# Patient Record
Sex: Female | Born: 1960 | Race: Black or African American | Hispanic: No | Marital: Married | Smoking: Current every day smoker
Health system: Southern US, Community
[De-identification: ages and names within clinical notes are randomized; demographics above are authoritative.]

## PROBLEM LIST (undated history)

## (undated) DIAGNOSIS — I5042 Chronic combined systolic (congestive) and diastolic (congestive) heart failure: Secondary | ICD-10-CM

## (undated) DIAGNOSIS — I469 Cardiac arrest, cause unspecified: Secondary | ICD-10-CM

## (undated) DIAGNOSIS — M5417 Radiculopathy, lumbosacral region: Secondary | ICD-10-CM

## (undated) DIAGNOSIS — I739 Peripheral vascular disease, unspecified: Secondary | ICD-10-CM

## (undated) DIAGNOSIS — I422 Other hypertrophic cardiomyopathy: Secondary | ICD-10-CM

## (undated) DIAGNOSIS — N183 Chronic kidney disease, stage 3 unspecified: Secondary | ICD-10-CM

## (undated) DIAGNOSIS — E7800 Pure hypercholesterolemia, unspecified: Secondary | ICD-10-CM

## (undated) DIAGNOSIS — F1120 Opioid dependence, uncomplicated: Secondary | ICD-10-CM

## (undated) DIAGNOSIS — I10 Essential (primary) hypertension: Secondary | ICD-10-CM

## (undated) DIAGNOSIS — I71019 Dissection of thoracic aorta, unspecified: Secondary | ICD-10-CM

## (undated) DIAGNOSIS — K769 Liver disease, unspecified: Secondary | ICD-10-CM

## (undated) DIAGNOSIS — G8929 Other chronic pain: Secondary | ICD-10-CM

## (undated) DIAGNOSIS — E669 Obesity, unspecified: Secondary | ICD-10-CM

## (undated) DIAGNOSIS — I428 Other cardiomyopathies: Secondary | ICD-10-CM

## (undated) DIAGNOSIS — R079 Chest pain, unspecified: Secondary | ICD-10-CM

## (undated) DIAGNOSIS — R55 Syncope and collapse: Secondary | ICD-10-CM

## (undated) DIAGNOSIS — H269 Unspecified cataract: Secondary | ICD-10-CM

## (undated) DIAGNOSIS — I7101 Dissection of thoracic aorta: Secondary | ICD-10-CM

## (undated) DIAGNOSIS — I119 Hypertensive heart disease without heart failure: Secondary | ICD-10-CM

## (undated) HISTORY — DX: Syncope and collapse: R55

## (undated) HISTORY — DX: Chronic kidney disease, stage 3 unspecified: N18.30

## (undated) HISTORY — DX: Unspecified cataract: H26.9

## (undated) HISTORY — DX: Chronic combined systolic (congestive) and diastolic (congestive) heart failure: I50.42

## (undated) HISTORY — DX: Other cardiomyopathies: I42.8

## (undated) HISTORY — DX: Chronic kidney disease, stage 3 (moderate): N18.3

## (undated) HISTORY — PX: ANTERIOR CRUCIATE LIGAMENT REPAIR: SHX115

## (undated) HISTORY — DX: Essential (primary) hypertension: I10

## (undated) HISTORY — DX: Dissection of thoracic aorta: I71.01

## (undated) HISTORY — DX: Dissection of thoracic aorta, unspecified: I71.019

## (undated) HISTORY — DX: Radiculopathy, lumbosacral region: M54.17

## (undated) HISTORY — DX: Hypertensive heart disease without heart failure: I42.2

## (undated) HISTORY — DX: Peripheral vascular disease, unspecified: I73.9

## (undated) HISTORY — DX: Obesity, unspecified: E66.9

## (undated) HISTORY — DX: Cardiac arrest, cause unspecified: I46.9

## (undated) HISTORY — DX: Other chronic pain: G89.29

## (undated) HISTORY — DX: Chest pain, unspecified: R07.9

## (undated) HISTORY — DX: Hypertensive heart disease without heart failure: I11.9

## (undated) HISTORY — DX: Liver disease, unspecified: K76.9

## (undated) HISTORY — DX: Pure hypercholesterolemia, unspecified: E78.00

---

## 2004-07-06 ENCOUNTER — Emergency Department: Payer: Self-pay | Admitting: Emergency Medicine

## 2005-01-24 ENCOUNTER — Emergency Department: Payer: Self-pay | Admitting: Emergency Medicine

## 2005-03-21 ENCOUNTER — Ambulatory Visit: Payer: Self-pay | Admitting: Family Medicine

## 2005-03-31 ENCOUNTER — Other Ambulatory Visit: Payer: Self-pay

## 2005-03-31 ENCOUNTER — Emergency Department: Payer: Self-pay | Admitting: Emergency Medicine

## 2005-06-04 ENCOUNTER — Ambulatory Visit: Payer: Self-pay

## 2005-06-28 ENCOUNTER — Emergency Department (HOSPITAL_COMMUNITY): Admission: EM | Admit: 2005-06-28 | Discharge: 2005-06-28 | Payer: Self-pay | Admitting: Emergency Medicine

## 2005-08-27 ENCOUNTER — Emergency Department: Payer: Self-pay | Admitting: Emergency Medicine

## 2005-09-01 ENCOUNTER — Ambulatory Visit: Payer: Self-pay | Admitting: Family Medicine

## 2005-09-23 ENCOUNTER — Encounter: Payer: Self-pay | Admitting: Family Medicine

## 2005-10-06 ENCOUNTER — Emergency Department: Payer: Self-pay | Admitting: Internal Medicine

## 2005-10-22 ENCOUNTER — Encounter: Payer: Self-pay | Admitting: Family Medicine

## 2005-11-22 ENCOUNTER — Encounter: Payer: Self-pay | Admitting: Family Medicine

## 2006-04-23 ENCOUNTER — Encounter: Admission: RE | Admit: 2006-04-23 | Discharge: 2006-04-23 | Payer: Self-pay | Admitting: Surgery

## 2006-09-14 ENCOUNTER — Ambulatory Visit: Payer: Self-pay | Admitting: Otolaryngology

## 2006-10-01 ENCOUNTER — Emergency Department: Payer: Self-pay | Admitting: Emergency Medicine

## 2007-03-12 ENCOUNTER — Emergency Department: Payer: Self-pay | Admitting: Emergency Medicine

## 2007-06-25 ENCOUNTER — Ambulatory Visit: Payer: Self-pay | Admitting: Family Medicine

## 2007-12-02 ENCOUNTER — Other Ambulatory Visit: Payer: Self-pay

## 2007-12-03 ENCOUNTER — Inpatient Hospital Stay: Payer: Self-pay | Admitting: Internal Medicine

## 2007-12-15 ENCOUNTER — Emergency Department: Payer: Self-pay | Admitting: Emergency Medicine

## 2007-12-21 ENCOUNTER — Emergency Department (HOSPITAL_COMMUNITY): Admission: EM | Admit: 2007-12-21 | Discharge: 2007-12-21 | Payer: Self-pay | Admitting: *Deleted

## 2008-01-06 ENCOUNTER — Emergency Department (HOSPITAL_COMMUNITY): Admission: EM | Admit: 2008-01-06 | Discharge: 2008-01-06 | Payer: Self-pay | Admitting: Emergency Medicine

## 2008-06-29 ENCOUNTER — Emergency Department: Payer: Self-pay | Admitting: Emergency Medicine

## 2009-01-16 ENCOUNTER — Ambulatory Visit: Payer: Self-pay | Admitting: Family Medicine

## 2009-05-03 ENCOUNTER — Emergency Department (HOSPITAL_COMMUNITY): Admission: EM | Admit: 2009-05-03 | Discharge: 2009-05-03 | Payer: Self-pay | Admitting: Emergency Medicine

## 2009-08-27 ENCOUNTER — Emergency Department: Payer: Self-pay | Admitting: Emergency Medicine

## 2010-01-24 ENCOUNTER — Emergency Department: Payer: Self-pay | Admitting: Emergency Medicine

## 2010-03-24 HISTORY — PX: CHOLECYSTECTOMY: SHX55

## 2010-04-06 ENCOUNTER — Emergency Department: Payer: Self-pay | Admitting: Emergency Medicine

## 2010-06-13 LAB — D-DIMER, QUANTITATIVE: D-Dimer, Quant: 0.69 ug/mL-FEU — ABNORMAL HIGH (ref 0.00–0.48)

## 2010-07-09 ENCOUNTER — Ambulatory Visit: Payer: Self-pay | Admitting: Internal Medicine

## 2010-07-23 ENCOUNTER — Ambulatory Visit: Payer: Self-pay | Admitting: Internal Medicine

## 2010-10-03 ENCOUNTER — Emergency Department (HOSPITAL_COMMUNITY)
Admission: EM | Admit: 2010-10-03 | Discharge: 2010-10-03 | Discharge status: Against Medical Advice | Payer: Self-pay | Attending: Emergency Medicine | Admitting: Emergency Medicine

## 2011-02-14 ENCOUNTER — Emergency Department: Payer: Self-pay | Admitting: Emergency Medicine

## 2011-04-21 ENCOUNTER — Ambulatory Visit: Payer: Self-pay | Admitting: Internal Medicine

## 2011-06-27 ENCOUNTER — Ambulatory Visit: Payer: Self-pay

## 2011-08-05 ENCOUNTER — Emergency Department: Payer: Self-pay | Admitting: Emergency Medicine

## 2011-08-05 LAB — CBC
HCT: 37.2 % (ref 35.0–47.0)
HGB: 12.8 g/dL (ref 12.0–16.0)
MCH: 31.3 pg (ref 26.0–34.0)
MCHC: 34.4 g/dL (ref 32.0–36.0)
RBC: 4.09 10*6/uL (ref 3.80–5.20)
RDW: 15.1 % — ABNORMAL HIGH (ref 11.5–14.5)
WBC: 6.2 10*3/uL (ref 3.6–11.0)

## 2011-08-05 LAB — BASIC METABOLIC PANEL
Anion Gap: 8 (ref 7–16)
BUN: 8 mg/dL (ref 7–18)
Creatinine: 0.79 mg/dL (ref 0.60–1.30)
EGFR (African American): >60
EGFR (Non-African Amer.): >60
Osmolality: 278 (ref 275–301)
Potassium: 3.1 mmol/L — ABNORMAL LOW (ref 3.5–5.1)
Sodium: 140 mmol/L (ref 136–145)

## 2011-11-05 ENCOUNTER — Emergency Department: Payer: Self-pay | Admitting: Unknown Physician Specialty

## 2011-11-05 LAB — COMPREHENSIVE METABOLIC PANEL
Albumin: 3.5 g/dL (ref 3.4–5.0)
Anion Gap: 6 — ABNORMAL LOW (ref 7–16)
BUN: 19 mg/dL — ABNORMAL HIGH (ref 7–18)
Bilirubin,Total: 0.4 mg/dL (ref 0.2–1.0)
Creatinine: 1.08 mg/dL (ref 0.60–1.30)
Glucose: 296 mg/dL — ABNORMAL HIGH (ref 65–99)
Osmolality: 291 (ref 275–301)
Potassium: 3.9 mmol/L (ref 3.5–5.1)
Sodium: 139 mmol/L (ref 136–145)
Total Protein: 7.4 g/dL (ref 6.4–8.2)

## 2011-11-05 LAB — URINALYSIS, COMPLETE
Nitrite: NEGATIVE
Ph: 5 (ref 4.5–8.0)
Protein: NEGATIVE
RBC,UR: 3 /HPF (ref 0–5)
WBC UR: 12 /HPF (ref 0–5)

## 2011-11-05 LAB — CBC
HCT: 37.8 % (ref 35.0–47.0)
HGB: 13 g/dL (ref 12.0–16.0)
MCH: 31.9 pg (ref 26.0–34.0)
MCHC: 34.3 g/dL (ref 32.0–36.0)
Platelet: 370 10*3/uL (ref 150–440)

## 2011-11-05 LAB — CK TOTAL AND CKMB (NOT AT ARMC)
CK, Total: 82 U/L (ref 21–215)
CK-MB: 1.8 ng/mL (ref 0.5–3.6)

## 2011-11-05 LAB — TROPONIN I: Troponin-I: <0.02 ng/mL

## 2011-11-06 ENCOUNTER — Emergency Department: Payer: Self-pay | Admitting: Emergency Medicine

## 2011-11-06 LAB — MAGNESIUM: Magnesium: 2.3 mg/dL

## 2011-11-06 LAB — BASIC METABOLIC PANEL
BUN: 14 mg/dL (ref 7–18)
EGFR (African American): >60
EGFR (Non-African Amer.): >60
Glucose: 281 mg/dL — ABNORMAL HIGH (ref 65–99)
Osmolality: 284 (ref 275–301)
Potassium: 4.5 mmol/L (ref 3.5–5.1)
Sodium: 137 mmol/L (ref 136–145)

## 2011-11-06 LAB — PROTIME-INR
INR: 0.9
Prothrombin Time: 12.1 secs (ref 11.5–14.7)

## 2011-11-25 ENCOUNTER — Encounter: Payer: Self-pay | Admitting: Pulmonary Disease

## 2011-11-26 ENCOUNTER — Encounter: Payer: Self-pay | Admitting: Pulmonary Disease

## 2011-11-26 ENCOUNTER — Ambulatory Visit (INDEPENDENT_AMBULATORY_CARE_PROVIDER_SITE_OTHER): Payer: Self-pay | Admitting: Pulmonary Disease

## 2011-11-26 VITALS — BP 120/72 | HR 95 | Temp 98.7°F | Ht 64.0 in | Wt 218.2 lb

## 2011-11-26 DIAGNOSIS — R0609 Other forms of dyspnea: Secondary | ICD-10-CM

## 2011-11-26 NOTE — Assessment & Plan Note (Signed)
The patient has significant dyspnea on exertion that I suspect is multifactorial.  She is obese, and has gained 40 pounds in the last one year.  She admits that she is sedentary and has poor conditioning as well.  By her description, she is having recurrent flareups of asthmatic bronchitis, related to her ongoing smoking.  It is unknown if she has COPD.  Finally, she has very significant lower extremity edema, and with her history of hypertension, I wonder if she has diastolic dysfunction.  She will obviously need for aggressive diuresis, and I will leave that to her primary care physician.  I would like to schedule her for full PFTs, and we'll keep her on her current inhaler regimen for now.  I had a long discussion with her about the role of smoking with respect to chronic airway inflammation, and how a lot of her symptoms could resolve if she was to totally quit smoking.  I will see her the same day as her pulmonary function studies to make further recommendations.

## 2011-11-26 NOTE — Progress Notes (Signed)
  Subjective:    Patient ID: Lisa Leblanc, female    DOB: 12-30-1960, 51 y.o.   MRN: [id number]  HPI The patient is a very pleasant 51 year old female who I've been asked to see for evaluation of dyspnea on exertion.  The patient states that she has had shortness of breath for at least a year, and feels that it is worsening.  She describes a 3-4 block dyspnea on exertion on flat ground at a moderate pace, and will get winded bringing groceries in from the car.  She sometimes will get short of breath with light housework.  She states there is variability in her breathing symptoms, and this typically can change month to month.  She also describes flareups on a frequent basis that requires a course of antibiotics and prednisone to improve.  At baseline, she has mild cough, with very little mucus.  She also has had a big problem with lower extremity edema, and tells me that she has had an ultrasound exam that was negative for DVT.  She has also had an echocardiogram, and tells me that her heart function was normal.  She had a chest x-ray approximately 2-3 months ago, and tells me that it was clear.  It is not available for my review.  The patient does smoke one pack of cigarettes a day and has done so for many years.  She has never had pulmonary function studies.  She also states that her weight is up 40 pounds over the last one year.   Review of Systems  Constitutional: Negative for fever and unexpected weight change.  HENT: Negative for ear pain, nosebleeds, congestion, sore throat, rhinorrhea, sneezing, trouble swallowing, dental problem, postnasal drip and sinus pressure.   Eyes: Negative for redness and itching.  Respiratory: Positive for chest tightness, shortness of breath and wheezing. Negative for cough.   Cardiovascular: Positive for leg swelling. Negative for palpitations.  Gastrointestinal: Negative for nausea and vomiting.  Genitourinary: Negative for dysuria.  Musculoskeletal: Positive  for joint swelling.  Skin: Negative for rash.  Neurological: Negative for headaches.  Hematological: Does not bruise/bleed easily.  Psychiatric/Behavioral: Negative for dysphoric mood. The patient is not nervous/anxious.        Objective:   Physical Exam Constitutional:  Obese female, no acute distress  HENT:  Nares patent without discharge  Oropharynx without exudate, palate and uvula are thick and elongated.   Eyes:  Perrla, eomi, no scleral icterus  Neck:  No JVD, no TMG  Cardiovascular:  Normal rate, regular rhythm, no rubs or gallops.  No murmurs        Intact distal pulses  Pulmonary :  Normal breath sounds, no stridor or respiratory distress   No rales, rhonchi, or wheezing  Abdominal:  Soft, nondistended, bowel sounds present.  No tenderness noted.   Musculoskeletal:  2+ lower extremity edema noted.  Lymph Nodes:  No cervical lymphadenopathy noted  Skin:  No cyanosis noted  Neurologic:  Alert, appropriate, moves all 4 extremities without obvious deficit.         Assessment & Plan:

## 2011-11-26 NOTE — Patient Instructions (Addendum)
Will schedule for breathing tests in next 1-2 weeks, and I would like to see you the same day to review Stop smoking.  This is the only way to put the fire out.   No change in breathing medications for now, and will re-address after your breathing tests.

## 2011-12-04 ENCOUNTER — Telehealth: Payer: Self-pay | Admitting: Pulmonary Disease

## 2011-12-04 ENCOUNTER — Ambulatory Visit (INDEPENDENT_AMBULATORY_CARE_PROVIDER_SITE_OTHER): Payer: Self-pay | Admitting: Pulmonary Disease

## 2011-12-04 ENCOUNTER — Encounter: Payer: Self-pay | Admitting: Pulmonary Disease

## 2011-12-04 VITALS — BP 132/82 | HR 62 | Temp 98.4°F | Ht 64.0 in | Wt 233.0 lb

## 2011-12-04 DIAGNOSIS — Z23 Encounter for immunization: Secondary | ICD-10-CM

## 2011-12-04 DIAGNOSIS — J45901 Unspecified asthma with (acute) exacerbation: Secondary | ICD-10-CM | POA: Insufficient documentation

## 2011-12-04 DIAGNOSIS — R0989 Other specified symptoms and signs involving the circulatory and respiratory systems: Secondary | ICD-10-CM

## 2011-12-04 LAB — PULMONARY FUNCTION TEST

## 2011-12-04 MED ORDER — PREDNISONE 10 MG PO TABS: ORAL_TABLET | ORAL | Status: DC

## 2011-12-04 NOTE — Progress Notes (Signed)
  Subjective:    Patient ID: Lisa Leblanc, female    DOB: 05-Nov-1960, 51 y.o.   MRN: [id number]  HPI The patient comes in today for followup of her recent pulmonary function studies, as part of a workup for dyspnea on exertion.  She was found to have no air flow obstruction, no restriction, and a moderate decrease in diffusion capacity that nearly corrects with alveolar volume adjustment.  I have reviewed the study with her in detail, and answered all of her questions.  Today, the patient is complaining of some increased chest tightness, congestion, and a little more shortness of breath.  She is not having any cough or mucus production.  Unfortunately, she continues to smoke.   Review of Systems  Constitutional: Negative for fever and unexpected weight change.  HENT: Positive for congestion. Negative for ear pain, nosebleeds, sore throat, rhinorrhea, sneezing, trouble swallowing, dental problem, postnasal drip and sinus pressure.   Eyes: Negative for redness and itching.  Respiratory: Positive for chest tightness, shortness of breath and wheezing. Negative for cough.   Cardiovascular: Positive for leg swelling. Negative for palpitations.  Gastrointestinal: Negative for nausea and vomiting.  Genitourinary: Negative for dysuria.  Musculoskeletal: Positive for joint swelling.  Skin: Negative for rash.  Neurological: Negative for headaches.  Hematological: Does not bruise/bleed easily.  Psychiatric/Behavioral: Negative for dysphoric mood. The patient is not nervous/anxious.        Objective:   Physical Exam Morbidly obese female in no acute distress Nose without purulent discharge noted Oropharynx clear Chest totally clear to auscultation, no wheezing Cardiac exam with regular rate and rhythm Lower extremities with 2+ edema, no cyanosis Alert and oriented, moves all 4 extremities.       Assessment & Plan:

## 2011-12-04 NOTE — Progress Notes (Signed)
PFT done today. 

## 2011-12-04 NOTE — Telephone Encounter (Signed)
Please call [facility] and get cxr report faxed over.  Was done in last 6mos?

## 2011-12-04 NOTE — Telephone Encounter (Signed)
CXR report received and given to [facility] for review.

## 2011-12-04 NOTE — Assessment & Plan Note (Signed)
The patient notes increasing chest tightness with restriction to airflow, as well as some increased chest congestion.  She may be having an early flare of acute asthmatic bronchitis.  I will treat her with a short course of prednisone, but again have stressed to her that she must stop smoking if she expects these episodes to resolve.  If her symptoms do not get better, and she continues to have chest tightness, she needs to address this with her primary physician for possible cardiac etiologies.

## 2011-12-04 NOTE — Assessment & Plan Note (Signed)
The patient has no airflow obstruction by spirometry today, and therefore has no COPD by definition.  I suspect her recurrent episodes are related to asthmatic bronchitis associated with ongoing smoking and airway inflammation.  She does not need to stay on a maintenance bronchodilator, but would keep her on a rescue inhaler only because of her ongoing smoking.  I think her 40 pound weight gain over the last one year is contributing significantly to her shortness of breath, as well as volume overload related to probable diastolic dysfunction.

## 2011-12-04 NOTE — Patient Instructions (Addendum)
Your breathing tests show that you do not have COPD.  Therefore, your issues with recurrent infections and wheezing will resolve if you quit smoking.  Your day to day shortness of breath is due to your weight and fluid build up.  Stop spiriva. Work on weight loss.  Continue albuterol 2 puffs up to every 6hrs, but only for rescue.  You do not need to take this regularly. Will treat you with a short course of prednisone for your current symptoms of chest tightness and congestion.  If you do not improve, would discuss with your primary physician for possible cardiac issues. You do not require further pulmonary followup

## 2011-12-12 ENCOUNTER — Encounter: Payer: Self-pay | Admitting: Pulmonary Disease

## 2012-01-23 ENCOUNTER — Emergency Department: Payer: Self-pay | Admitting: Emergency Medicine

## 2012-01-23 LAB — CBC WITH DIFFERENTIAL/PLATELET
Basophil %: 0.2 %
Eosinophil %: 0 %
HCT: 39.6 % (ref 35.0–47.0)
HGB: 13.4 g/dL (ref 12.0–16.0)
Lymphocyte #: 0.7 10*3/uL — ABNORMAL LOW (ref 1.0–3.6)
MCH: 31.7 pg (ref 26.0–34.0)
MCHC: 33.8 g/dL (ref 32.0–36.0)
Monocyte #: 0.2 x10 3/mm (ref 0.2–0.9)
Monocyte %: 2 %
RBC: 4.23 10*6/uL (ref 3.80–5.20)

## 2012-01-23 LAB — COMPREHENSIVE METABOLIC PANEL
Albumin: 3.4 g/dL (ref 3.4–5.0)
Alkaline Phosphatase: 95 U/L (ref 50–136)
Bilirubin,Total: 0.4 mg/dL (ref 0.2–1.0)
Calcium, Total: 8.8 mg/dL (ref 8.5–10.1)
Chloride: 95 mmol/L — ABNORMAL LOW (ref 98–107)
Co2: 30 mmol/L (ref 21–32)
EGFR (African American): 57 — ABNORMAL LOW
EGFR (Non-African Amer.): 49 — ABNORMAL LOW
Osmolality: 294 (ref 275–301)
Potassium: 3.5 mmol/L (ref 3.5–5.1)
SGOT(AST): 22 U/L (ref 15–37)
SGPT (ALT): 32 U/L (ref 12–78)
Total Protein: 7.4 g/dL (ref 6.4–8.2)

## 2012-02-24 ENCOUNTER — Other Ambulatory Visit: Payer: Self-pay | Admitting: Family Medicine

## 2012-06-28 ENCOUNTER — Emergency Department: Payer: Self-pay | Admitting: Emergency Medicine

## 2012-06-28 LAB — COMPREHENSIVE METABOLIC PANEL
Albumin: 3.1 g/dL — ABNORMAL LOW (ref 3.4–5.0)
BUN: 17 mg/dL (ref 7–18)
Bilirubin,Total: 0.3 mg/dL (ref 0.2–1.0)
Chloride: 102 mmol/L (ref 98–107)
Co2: 29 mmol/L (ref 21–32)
Glucose: 179 mg/dL — ABNORMAL HIGH (ref 65–99)
Potassium: 3.9 mmol/L (ref 3.5–5.1)
SGOT(AST): 19 U/L (ref 15–37)
Sodium: 137 mmol/L (ref 136–145)
Total Protein: 6.4 g/dL (ref 6.4–8.2)

## 2012-06-28 LAB — CBC
HCT: 36.1 % (ref 35.0–47.0)
HGB: 11.9 g/dL — ABNORMAL LOW (ref 12.0–16.0)
MCH: 30.8 pg (ref 26.0–34.0)
MCHC: 33 g/dL (ref 32.0–36.0)
MCV: 93 fL (ref 80–100)
Platelet: 306 10*3/uL (ref 150–440)
WBC: 14.9 10*3/uL — ABNORMAL HIGH (ref 3.6–11.0)

## 2012-06-28 LAB — TROPONIN I: Troponin-I: <0.02 ng/mL

## 2012-11-17 ENCOUNTER — Ambulatory Visit: Payer: Self-pay | Admitting: Family Medicine

## 2012-11-20 ENCOUNTER — Inpatient Hospital Stay (HOSPITAL_COMMUNITY): Payer: BC Managed Care – PPO

## 2012-11-20 ENCOUNTER — Emergency Department (HOSPITAL_COMMUNITY): Payer: BC Managed Care – PPO

## 2012-11-20 ENCOUNTER — Inpatient Hospital Stay (HOSPITAL_COMMUNITY)
Admission: EM | Admit: 2012-11-20 | Discharge: 2012-11-24 | DRG: 316 | Disposition: A | Discharge status: Home / Self Care | Payer: BC Managed Care – PPO | Attending: Internal Medicine | Admitting: Internal Medicine

## 2012-11-20 ENCOUNTER — Encounter (HOSPITAL_COMMUNITY): Payer: Self-pay | Admitting: Internal Medicine

## 2012-11-20 DIAGNOSIS — F17200 Nicotine dependence, unspecified, uncomplicated: Secondary | ICD-10-CM | POA: Diagnosis present

## 2012-11-20 DIAGNOSIS — E876 Hypokalemia: Secondary | ICD-10-CM

## 2012-11-20 DIAGNOSIS — N179 Acute kidney failure, unspecified: Principal | ICD-10-CM

## 2012-11-20 DIAGNOSIS — J4489 Other specified chronic obstructive pulmonary disease: Secondary | ICD-10-CM | POA: Diagnosis present

## 2012-11-20 DIAGNOSIS — R5381 Other malaise: Secondary | ICD-10-CM | POA: Diagnosis present

## 2012-11-20 DIAGNOSIS — E669 Obesity, unspecified: Secondary | ICD-10-CM

## 2012-11-20 DIAGNOSIS — T4275XA Adverse effect of unspecified antiepileptic and sedative-hypnotic drugs, initial encounter: Secondary | ICD-10-CM | POA: Diagnosis present

## 2012-11-20 DIAGNOSIS — R066 Hiccough: Secondary | ICD-10-CM | POA: Diagnosis present

## 2012-11-20 DIAGNOSIS — J45901 Unspecified asthma with (acute) exacerbation: Secondary | ICD-10-CM

## 2012-11-20 DIAGNOSIS — F1920 Other psychoactive substance dependence, uncomplicated: Secondary | ICD-10-CM | POA: Diagnosis present

## 2012-11-20 DIAGNOSIS — D638 Anemia in other chronic diseases classified elsewhere: Secondary | ICD-10-CM | POA: Diagnosis present

## 2012-11-20 DIAGNOSIS — F1120 Opioid dependence, uncomplicated: Secondary | ICD-10-CM

## 2012-11-20 DIAGNOSIS — R079 Chest pain, unspecified: Secondary | ICD-10-CM | POA: Diagnosis present

## 2012-11-20 DIAGNOSIS — I10 Essential (primary) hypertension: Secondary | ICD-10-CM

## 2012-11-20 DIAGNOSIS — Z91041 Radiographic dye allergy status: Secondary | ICD-10-CM

## 2012-11-20 DIAGNOSIS — Z9089 Acquired absence of other organs: Secondary | ICD-10-CM

## 2012-11-20 DIAGNOSIS — Z800 Family history of malignant neoplasm of digestive organs: Secondary | ICD-10-CM

## 2012-11-20 DIAGNOSIS — E871 Hypo-osmolality and hyponatremia: Secondary | ICD-10-CM

## 2012-11-20 DIAGNOSIS — Y92009 Unspecified place in unspecified non-institutional (private) residence as the place of occurrence of the external cause: Secondary | ICD-10-CM

## 2012-11-20 DIAGNOSIS — J449 Chronic obstructive pulmonary disease, unspecified: Secondary | ICD-10-CM | POA: Diagnosis present

## 2012-11-20 DIAGNOSIS — K5900 Constipation, unspecified: Secondary | ICD-10-CM

## 2012-11-20 DIAGNOSIS — R0609 Other forms of dyspnea: Secondary | ICD-10-CM

## 2012-11-20 DIAGNOSIS — E860 Dehydration: Secondary | ICD-10-CM

## 2012-11-20 DIAGNOSIS — K5909 Other constipation: Secondary | ICD-10-CM | POA: Diagnosis present

## 2012-11-20 DIAGNOSIS — K56609 Unspecified intestinal obstruction, unspecified as to partial versus complete obstruction: Secondary | ICD-10-CM | POA: Diagnosis present

## 2012-11-20 DIAGNOSIS — E440 Moderate protein-calorie malnutrition: Secondary | ICD-10-CM | POA: Diagnosis present

## 2012-11-20 DIAGNOSIS — M5417 Radiculopathy, lumbosacral region: Secondary | ICD-10-CM

## 2012-11-20 DIAGNOSIS — R9431 Abnormal electrocardiogram [ECG] [EKG]: Secondary | ICD-10-CM

## 2012-11-20 DIAGNOSIS — E119 Type 2 diabetes mellitus without complications: Secondary | ICD-10-CM

## 2012-11-20 DIAGNOSIS — I959 Hypotension, unspecified: Secondary | ICD-10-CM

## 2012-11-20 DIAGNOSIS — Z9181 History of falling: Secondary | ICD-10-CM

## 2012-11-20 DIAGNOSIS — G894 Chronic pain syndrome: Secondary | ICD-10-CM | POA: Diagnosis present

## 2012-11-20 DIAGNOSIS — Z9981 Dependence on supplemental oxygen: Secondary | ICD-10-CM

## 2012-11-20 DIAGNOSIS — W19XXXA Unspecified fall, initial encounter: Secondary | ICD-10-CM | POA: Diagnosis present

## 2012-11-20 DIAGNOSIS — R112 Nausea with vomiting, unspecified: Secondary | ICD-10-CM

## 2012-11-20 HISTORY — DX: Opioid dependence, uncomplicated: F11.20

## 2012-11-20 LAB — BASIC METABOLIC PANEL
Calcium: 9.8 mg/dL (ref 8.4–10.5)
Creatinine, Ser: 6.07 mg/dL — ABNORMAL HIGH (ref 0.50–1.10)
Potassium: 2.5 mEq/L — CL (ref 3.5–5.1)

## 2012-11-20 LAB — CBC WITH DIFFERENTIAL/PLATELET
Basophils Absolute: 0 10*3/uL (ref 0.0–0.1)
Basophils Relative: 0 % (ref 0–1)
Eosinophils Absolute: 0 10*3/uL (ref 0.0–0.7)
Eosinophils Relative: 0 % (ref 0–5)
Hemoglobin: 13.4 g/dL (ref 12.0–15.0)
Lymphs Abs: 1.7 10*3/uL (ref 0.7–4.0)
MCH: 28 pg (ref 26.0–34.0)
MCHC: 34.1 g/dL (ref 30.0–36.0)
MCV: 82.2 fL (ref 78.0–100.0)
Platelets: 512 10*3/uL — ABNORMAL HIGH (ref 150–400)
RBC: 4.78 MIL/uL (ref 3.87–5.11)

## 2012-11-20 LAB — HEPATIC FUNCTION PANEL
AST: 14 U/L (ref 0–37)
Albumin: 3.4 g/dL — ABNORMAL LOW (ref 3.5–5.2)
Alkaline Phosphatase: 98 U/L (ref 39–117)

## 2012-11-20 MED ORDER — DOCUSATE SODIUM 283 MG RE ENEM
1.0000 | ENEMA | Freq: Once | RECTAL | Status: AC
  Administered 2012-11-21: 283 mg via RECTAL
  Filled 2012-11-20: qty 1

## 2012-11-20 MED ORDER — SODIUM CHLORIDE 0.9 % IV SOLN: INTRAVENOUS | Status: DC

## 2012-11-20 MED ORDER — FENTANYL CITRATE 0.05 MG/ML IJ SOLN
100.0000 ug | Freq: Once | INTRAMUSCULAR | Status: AC
  Administered 2012-11-20: 100 ug via INTRAVENOUS
  Filled 2012-11-20: qty 2

## 2012-11-20 MED ORDER — ONDANSETRON HCL 4 MG/2ML IJ SOLN: 4.0000 mg | Freq: Three times a day (TID) | INTRAMUSCULAR | Status: DC | PRN

## 2012-11-20 MED ORDER — SODIUM CHLORIDE 0.9 % IV SOLN
INTRAVENOUS | Status: DC
  Administered 2012-11-20 – 2012-11-21 (×2): via INTRAVENOUS

## 2012-11-20 MED ORDER — BISACODYL 5 MG PO TBEC
5.0000 mg | DELAYED_RELEASE_TABLET | Freq: Once | ORAL | Status: AC
Start: 2012-11-20 — End: 2012-11-21
  Administered 2012-11-21: 5 mg via ORAL
  Filled 2012-11-20: qty 1

## 2012-11-20 MED ORDER — ACETAMINOPHEN 650 MG RE SUPP: 650.0000 mg | Freq: Four times a day (QID) | RECTAL | Status: DC | PRN

## 2012-11-20 MED ORDER — NICOTINE 14 MG/24HR TD PT24
14.0000 mg | MEDICATED_PATCH | Freq: Every day | TRANSDERMAL | Status: DC
  Administered 2012-11-21: 14 mg via TRANSDERMAL
  Filled 2012-11-20: qty 1

## 2012-11-20 MED ORDER — SODIUM CHLORIDE 0.9 % IJ SOLN
3.0000 mL | Freq: Two times a day (BID) | INTRAMUSCULAR | Status: DC
  Administered 2012-11-21: 10 mL via INTRAVENOUS

## 2012-11-20 MED ORDER — SODIUM CHLORIDE 0.9 % IV BOLUS (SEPSIS)
1000.0000 mL | Freq: Once | INTRAVENOUS | Status: AC
  Administered 2012-11-20: 1000 mL via INTRAVENOUS

## 2012-11-20 MED ORDER — OXYCODONE HCL ER 20 MG PO T12A
60.0000 mg | EXTENDED_RELEASE_TABLET | Freq: Two times a day (BID) | ORAL | Status: DC
  Administered 2012-11-21 (×2): 60 mg via ORAL
  Filled 2012-11-20 (×2): qty 3

## 2012-11-20 MED ORDER — OXYCODONE HCL 5 MG PO TABS: 5.0000 mg | ORAL_TABLET | ORAL | Status: DC | PRN

## 2012-11-20 MED ORDER — HYDROMORPHONE HCL PF 1 MG/ML IJ SOLN: 1.0000 mg | INTRAMUSCULAR | Status: DC | PRN

## 2012-11-20 MED ORDER — ONDANSETRON HCL 4 MG/2ML IJ SOLN
4.0000 mg | Freq: Four times a day (QID) | INTRAMUSCULAR | Status: DC | PRN
  Administered 2012-11-21: 4 mg via INTRAVENOUS
  Filled 2012-11-20: qty 2

## 2012-11-20 MED ORDER — ACETAMINOPHEN 325 MG PO TABS: 650.0000 mg | ORAL_TABLET | Freq: Four times a day (QID) | ORAL | Status: DC | PRN

## 2012-11-20 MED ORDER — PEG 3350-KCL-NA BICARB-NACL 420 G PO SOLR
4000.0000 mL | Freq: Once | ORAL | Status: DC
  Filled 2012-11-20: qty 4000

## 2012-11-20 MED ORDER — PANTOPRAZOLE SODIUM 40 MG PO TBEC
40.0000 mg | DELAYED_RELEASE_TABLET | Freq: Every day | ORAL | Status: DC
  Administered 2012-11-21: 40 mg via ORAL
  Filled 2012-11-20: qty 1

## 2012-11-20 MED ORDER — HEPARIN SODIUM (PORCINE) 5000 UNIT/ML IJ SOLN
5000.0000 [IU] | Freq: Three times a day (TID) | INTRAMUSCULAR | Status: DC
  Administered 2012-11-21 (×2): 5000 [IU] via SUBCUTANEOUS
  Filled 2012-11-20 (×5): qty 1

## 2012-11-20 MED ORDER — ONDANSETRON HCL 4 MG PO TABS: 4.0000 mg | ORAL_TABLET | Freq: Four times a day (QID) | ORAL | Status: DC | PRN

## 2012-11-20 MED ORDER — DOCUSATE SODIUM 100 MG PO CAPS
100.0000 mg | ORAL_CAPSULE | Freq: Two times a day (BID) | ORAL | Status: DC
  Administered 2012-11-21 (×2): 100 mg via ORAL
  Filled 2012-11-20 (×3): qty 1

## 2012-11-20 MED ORDER — ONDANSETRON HCL 4 MG/2ML IJ SOLN
4.0000 mg | Freq: Once | INTRAMUSCULAR | Status: AC
  Administered 2012-11-20: 4 mg via INTRAVENOUS
  Filled 2012-11-20: qty 2

## 2012-11-20 MED ORDER — SENNA 8.6 MG PO TABS
1.0000 | ORAL_TABLET | Freq: Two times a day (BID) | ORAL | Status: DC
  Administered 2012-11-21 (×2): 8.6 mg via ORAL
  Filled 2012-11-20: qty 1

## 2012-11-20 MED ORDER — INSULIN ASPART 100 UNIT/ML ~~LOC~~ SOLN: 0.0000 [IU] | Freq: Three times a day (TID) | SUBCUTANEOUS | Status: DC

## 2012-11-20 MED ORDER — CHLORPROMAZINE HCL 25 MG PO TABS
25.0000 mg | ORAL_TABLET | Freq: Three times a day (TID) | ORAL | Status: DC | PRN
  Filled 2012-11-20: qty 1

## 2012-11-20 MED ORDER — FENTANYL CITRATE 0.05 MG/ML IJ SOLN
50.0000 ug | Freq: Once | INTRAMUSCULAR | Status: AC
  Administered 2012-11-20: 50 ug via INTRAVENOUS
  Filled 2012-11-20: qty 2

## 2012-11-20 MED ORDER — PROMETHAZINE HCL 25 MG PO TABS: 25.0000 mg | ORAL_TABLET | Freq: Four times a day (QID) | ORAL | Status: DC | PRN

## 2012-11-20 MED ORDER — POTASSIUM CHLORIDE 10 MEQ/100ML IV SOLN
10.0000 meq | INTRAVENOUS | Status: AC
  Administered 2012-11-20: 10 meq via INTRAVENOUS
  Filled 2012-11-20 (×2): qty 100

## 2012-11-20 NOTE — H&P (Signed)
[facility] History and Physical  ESMAE DONATHAN ZOX:[id number] DOB: [birth year] DOA: 11/20/2012  Referring physician:  Devoria Albe PCP:  Dennison Mascot, MD   Chief Complaint:  Weakness, falls  HPI:  The patient is a 52 y.o. year-old female with history of cholecystectomy 2 years ago, chronic pain on long-acting narcotics, HTN on multiple ACEIs and an ARB, chronic 2L [place] without obstructive lung disease, diabetes diagnosed 8 months ago who presents with abdominal pain, weakness, and falls.  The patient was last at their baseline health several months ago.  She states that she developed severe constipation and despite numerous over the counter medications only in the last 4 days has she passed any stool.  She has had 4 very small and hard BMs this week.  She has had progressive cramping RUQ abdominal pain and distension with radiation to her back.  For the last two weeks, she has had severe nausea and vomiting, vomiting clear to yellow fluid about 10 times per day and inability to tolerate liquids or solids.  She has been urinating less than usual clear urine without dysuria.  The last few days, she has passed out several times when trying to stand up and has been extremity fatigued.  She endorses mild confusion, hiccoughs, and blurry vision.  Her friends had been urging her to come to the doctor, but she refused for the last few weeks, but felt so week today that she allowed them to bring her here.    In the ER, she was initially hypotensive to 65/49 which improved after several liters of IVF.  Sodium 128, potassium 2.5, chloride 76, bicarb 33, BUn 78, cr 6.07, plt 512, CXR NAD, KUB with copious stool and no evidence of obstruction.  She has not made urine so far in the ER.  She is being admitted for severe dehydration and AKI.    Review of Systems:  General:  + chills, had extreme weight gain and then weight loss recently.  HEENT:  Denies changes to hearing and vision, rhinorrhea, sinus  congestion, sore throat CV:  Denies chest pain and palpitations, lower extremity edema.  PULM:  Denies SOB, wheezing, cough.   GI:  + nausea, vomiting, constipation.   GU:  Decreased uop.   ENDO:  Denies polyuria, polydipsia.   HEME:  Denies hematemesis, blood in stools, melena, abnormal bruising or bleeding.  LYMPH:  Denies lymphadenopathy.   MSK:  Chronic arthralgias, myalgias.   DERM:  Denies skin rash or ulcer.   NEURO:  Denies focal numbness, weakness, slurred speech, confusion, facial droop.  PSYCH:  Denies anxiety and depression.    Past Medical History  Diagnosis Date  . Obesity   . Hypertension   . Lumbosacral neuritis   . Chronic pain   . Rupture of rotator cuff, complete   . Diabetes mellitus   . Narcotic dependence    Past Surgical History  Procedure Laterality Date  . Left knee arthroscopy    . Cholecystectomy  2012   Social History:  reports that she has been smoking Cigarettes.  She has a 15 pack-year smoking history. She has never used smokeless tobacco. She reports that she does not drink alcohol or use illicit drugs. Lives with her husband and her son.  Works at [employer] center.    Allergies  Allergen Reactions  . Ivp Dye [Iodinated Diagnostic Agents]     Family History  Problem Relation Age of Onset  . Colon cancer Brother   . Diabetes type II Brother   .  Diabetes Mother   . Hypertension Mother   . Diabetes Brother   . Colon cancer Sister   . Colon cancer Maternal Aunt   . Colon cancer Maternal Aunt   . Colon cancer Maternal Aunt      Prior to Admission medications   Medication Sig Start Date End Date Taking? Authorizing Provider  benazepril (LOTENSIN) 20 MG tablet Take 20 mg by mouth daily.   Yes Historical Provider, MD  glipiZIDE (GLUCOTROL XL) 5 MG 24 hr tablet Take 5 mg by mouth daily.   Yes Historical Provider, MD  insulin detemir (LEVEMIR) 100 UNIT/ML injection Inject 45 Units into the skin at bedtime.   Yes Historical Provider, MD   lisinopril-hydrochlorothiazide (PRINZIDE,ZESTORETIC) 20-12.5 MG per tablet Take 1 tablet by mouth daily.   Yes Historical Provider, MD  losartan (COZAAR) 25 MG tablet Take 25 mg by mouth daily.   Yes Historical Provider, MD  omeprazole (PRILOSEC) 20 MG capsule Take 20 mg by mouth daily.   Yes Historical Provider, MD  oxyCODONE (OXYCONTIN) 20 MG 12 hr tablet Take 60 mg by mouth every 12 (twelve) hours. pain   Yes Historical Provider, MD  promethazine (PHENERGAN) 25 MG tablet Take 25 mg by mouth every 6 (six) hours as needed for nausea (nausea).   Yes Historical Provider, MD   Physical Exam: Filed Vitals:   11/20/12 1922 11/20/12 1930 11/20/12 2000 11/20/12 2030  BP:  94/65 107/65 111/68  Pulse:  77 77 89  Temp: 99.6 F (37.6 C)     TempSrc: Rectal     Resp:  12 22 17   SpO2:  100% 100% 100%     General:  Obese AAF, no acute distress  Eyes:  PERRL, anicteric, non-injected.  ENT:  Nares clear.  OP clear, non-erythematous without plaques or exudates.  MMM.  Neck:  Supple without TM or JVD.    Lymph:  No cervical, supraclavicular, or submandibular LAD.  Cardiovascular:  RRR, normal S1, S2, without m/r/g.  2+ pulses, warm extremities  Respiratory:  CTA bilaterally without increased WOB.  Abdomen:  NABS.  Soft, ND/NT.    Skin:  No rashes or focal lesions.  Musculoskeletal:  Normal bulk and tone.  No LE edema.  Psychiatric:  A & O x 4.  Appropriate affect.  Neurologic:  CN 3-12 intact.  5/5 strength.  Sensation intact.  Labs on Admission:  Basic Metabolic Panel:  Recent Labs Lab 11/20/12 1852  NA 128*  K 2.5*  CL 76*  CO2 33*  GLUCOSE 117*  BUN 78*  CREATININE 6.07*  CALCIUM 9.8  MG 2.8*   Liver Function Tests:  Recent Labs Lab 11/20/12 1852  AST 14  ALT 11  ALKPHOS 98  BILITOT 0.3  PROT 7.4  ALBUMIN 3.4*   No results found for this basename: LIPASE, AMYLASE,  in the last 168 hours No results found for this basename: AMMONIA,  in the last 168  hours CBC:  Recent Labs Lab 11/20/12 1852  WBC 8.0  NEUTROABS 5.6  HGB 13.4  HCT 39.3  MCV 82.2  PLT 512*   Cardiac Enzymes: No results found for this basename: CKTOTAL, CKMB, CKMBINDEX, TROPONINI,  in the last 168 hours  BNP (last 3 results) No results found for this basename: PROBNP,  in the last 8760 hours CBG:  Recent Labs Lab 11/20/12 1842  GLUCAP 127*    Radiological Exams on Admission: Dg Chest Portable 1 View  11/20/2012   *RADIOLOGY REPORT*  Clinical Data: Abdominal pain,  nausea and vomiting.  PORTABLE CHEST - 1 VIEW  Comparison: 05/03/2009  Findings: The lungs are clear.  No edema, infiltrate or pleural fluid is seen.  Heart size is at the upper limits of normal.  No bony abnormalities are seen.  IMPRESSION: No active disease.   Original Report Authenticated By: Irish Lack, M.D.   Dg Abd 2 Views  11/20/2012   *RADIOLOGY REPORT*  Clinical Data: Nausea, vomiting.  ABDOMEN - 2 VIEW  Comparison: None.  Findings: No abnormal intra-abdominal mass effect or calcification.  Nonobstructive bowel gas pattern.  Stool present in the majority of the colonic segments. No pneumoperitoneum.  Previous right obturator ring fractures, involving the superior and inferior pubic rami.   Still evident fracture lucency through the superior pubic ramus; there is bulky associated callus.  Lung bases clear.  IMPRESSION:  1.  Nonobstructive bowel gas pattern. 2.  Large stool burden. 3.  Healing right obturator ring fractures.   Original Report Authenticated By: Tiburcio Pea    EKG: Independently reviewed. NSR, prolonged QTc 513, L-axis deviation and delayed R-wave progression  Assessment/Plan Principal Problem:   Acute kidney injury Active Problems:   Obesity   Hypertension   Diabetes mellitus   Narcotic dependence   Hyponatremia   Hypokalemia   Constipation   Nausea and vomiting   Dehydration   Hypotension  The patient presents with what likely started as severe  narcotic-induced constipation which led to functional bowel obstruction, nausea, vomiting, dehydration, hypotension, and renal failure.  See below.  Hypotension and dehydration:  Resolving with IVF  Acute kidney injury due to hypotension and dehydration, although has Sheilyn Boehlke history of DM x 8 months and HTN.  Also has been taking several ACEI and ARB.   -  RUS -  FENa -  UA -  Daily weights and strict I/O -  Hold ACEI and ARB -  IVF  -  Nephrology consultation if not recovering -  If patient tolerates fluids and does not become SOB, may transfer from stepdown soon  Constipation -  Has strong family history of colon cancer so will need a colonscopy ASAP -  TSH -  Nulytely, colace, senna, colace enema and bisacodyl -  Will need stimulants -  Minimize narcotics if possible  Hyponatremia, hypochloremia, hypokalemia likely due to dehydration and vomiting -  IVF -  Replace with IV potassium while still vomiting -  Repeat in AM  Nausea nad vomiting may be due to constipation, partial bowel obstruction, renal failure with elevated BUN -  IVF -  Abd [place] as has contrast allergy and cannot do IV contrast anyway -  Zofran and phenergan  DM,  -  Hold glipizide and start SSI  Chronic pain and narcotic dependence -  Continue long acting narcotics, but attempt to wean if possible  Diet:  Clear liquids during constipation clean out Access:  PIV IVF:  NS at 120ml/h Proph:  heparin  Code Status: full Family Communication: spoke with patient and he friend Disposition Plan: Admit to stepdown  Time spent: 60 min Renae Fickle [facility] Pager [phone]  If 7PM-7AM, please contact night-coverage [email or web address] Password [email or web address] 11/20/2012, 10:16 PM

## 2012-11-20 NOTE — ED Provider Notes (Signed)
CSN: [id number]     Arrival date & time 11/20/12  1804 History   First MD Initiated Contact with Patient 11/20/12 1835     Chief Complaint  Patient presents with  . Emesis  . Abdominal Pain   (Consider location/radiation/quality/duration/timing/severity/associated sxs/prior Treatment) HPI  Lisa Leblanc is a 52 y.o. female with past medical history significant for attention, insulin-dependent diabetes and COPD (on 2 L of oxygen at home at all times and (coming in today with 2 weeks of nonbloody, nonbilious, no coffee ground emesis. Patient denies diarrhea, endorses a subjective fever. Associated symptoms of multiple syncopal episodes and right upper quadrant abdominal pain. Patient is accompanied by her friend who is a nurse states that she has tried Zofran, Phenergan, have abdominal ultrasound unknown results, and stopped taking hypertension and insulin. Patient endorses a right-sided chest pain secondary to multiple falls, she denies shortness of breath, cough.   Past Medical History  Diagnosis Date  . Obesity   . Hypertension   . Lumbosacral neuritis   . Chronic pain   . Rupture of rotator cuff, complete    Past Surgical History  Procedure Laterality Date  . Left knee arthroscopy    . Cholecystectomy     Family History  Problem Relation Age of Onset  . Colon cancer Brother   . Diabetes type II Brother   . Diabetes Mother   . Hypertension Mother   . Diabetes Brother    History  Substance Use Topics  . Smoking status: Current Every Day Smoker -- 0.50 packs/day for 30 years    Types: Cigarettes  . Smokeless tobacco: Never Used     Comment: SMOKES LESS THAN 1 PK DAILY. Marland KitchenSMOKED 1 PK FOR 2--30 YRS   . Alcohol Use: No   OB History   Grav Para Term Preterm Abortions TAB SAB Ect Mult Living                 Review of Systems 10 systems reviewed and found to be negative, except as noted in the HPI  Allergies  Ivp dye  Home Medications   Current Outpatient Rx  Name   Route  Sig  Dispense  Refill  . benazepril (LOTENSIN) 20 MG tablet   Oral   Take 20 mg by mouth daily.         . insulin detemir (LEVEMIR) 100 UNIT/ML injection   Subcutaneous   Inject 45 Units into the skin at bedtime.         Marland Kitchen lisinopril-hydrochlorothiazide (PRINZIDE,ZESTORETIC) 20-12.5 MG per tablet   Oral   Take 1 tablet by mouth daily.         Marland Kitchen losartan (COZAAR) 25 MG tablet   Oral   Take 25 mg by mouth daily.         Marland Kitchen omeprazole (PRILOSEC) 20 MG capsule   Oral   Take 20 mg by mouth daily.         Marland Kitchen oxyCODONE (OXYCONTIN) 20 MG 12 hr tablet   Oral   Take 60 mg by mouth every 12 (twelve) hours. pain         . promethazine (PHENERGAN) 25 MG tablet   Oral   Take 25 mg by mouth every 6 (six) hours as needed for nausea (nausea).          BP 65/49  Pulse 109  Temp(Src) 98 F (36.7 C) (Oral)  Resp 16  SpO2 96% Physical Exam  Nursing note and vitals reviewed. Constitutional: She is  oriented to person, place, and time. She appears well-developed and well-nourished. No distress.  HENT:  Head: Normocephalic.  Dry MM  Eyes: Conjunctivae and EOM are normal. Pupils are equal, round, and reactive to light.  Cardiovascular: Regular rhythm, normal heart sounds and intact distal pulses.   Pulmonary/Chest: Effort normal and breath sounds normal. No stridor. No respiratory distress. She has no wheezes. She has no rales. She exhibits tenderness.  Tender to palpation especially on the right anterior axillary line, no crepitance.   Abdominal: Soft. Bowel sounds are normal. She exhibits no distension and no mass. There is tenderness. There is no rebound and no guarding.  Diffusely TTP, no rebound  Musculoskeletal: Normal range of motion.  Neurological: She is alert and oriented to person, place, and time.  Oriented x3, follows commands, strength is 4-5x4 extremities, distal sensation is grossly intact  Psychiatric: She has a normal mood and affect.    ED Course   Procedures (including critical care time) Labs Review Labs Reviewed  CBC WITH DIFFERENTIAL - Abnormal; Notable for the following:    RDW 16.3 (*)    Platelets 512 (*)    All other components within normal limits  BASIC METABOLIC PANEL - Abnormal; Notable for the following:    Sodium 128 (*)    Potassium 2.5 (*)    Chloride 76 (*)    CO2 33 (*)    Glucose, Bld 117 (*)    BUN 78 (*)    Creatinine, Ser 6.07 (*)    GFR calc non Af Amer 7 (*)    GFR calc Af Amer 8 (*)    All other components within normal limits  HEPATIC FUNCTION PANEL - Abnormal; Notable for the following:    Albumin 3.4 (*)    Indirect Bilirubin 0.2 (*)    All other components within normal limits  LACTIC ACID, PLASMA - Abnormal; Notable for the following:    Lactic Acid, Venous 2.5 (*)    All other components within normal limits  GLUCOSE, CAPILLARY - Abnormal; Notable for the following:    Glucose-Capillary 127 (*)    All other components within normal limits  MAGNESIUM - Abnormal; Notable for the following:    Magnesium 2.8 (*)    All other components within normal limits   Imaging Review Dg Chest Portable 1 View  11/20/2012   *RADIOLOGY REPORT*  Clinical Data: Abdominal pain, nausea and vomiting.  PORTABLE CHEST - 1 VIEW  Comparison: 05/03/2009  Findings: The lungs are clear.  No edema, infiltrate or pleural fluid is seen.  Heart size is at the upper limits of normal.  No bony abnormalities are seen.  IMPRESSION: No active disease.   Original Report Authenticated By: Irish Lack, M.D.    Date: 11/20/2012  Rate: 99  Rhythm: normal sinus rhythm  QRS Axis: left  Intervals: QT prolonged  ST/T Wave abnormalities: normal  Conduction Disutrbances:none  Narrative Interpretation:   Old EKG Reviewed: New prolonged Qtc at  7:04 PM systolic is now 95 after 1 L of saline under pressure. Second liter initiated.  MDM   1. ARF (acute renal failure)   2. Hypokalemia   3. Prolonged Q-T interval on ECG    4. Nausea & vomiting   5. Dehydration   6. Fall at home, initial encounter     Filed Vitals:   11/20/12 1921 11/20/12 1922 11/20/12 1930 11/20/12 2000  BP: 94/64  94/65 107/65  Pulse:   77 77  Temp:  99.6 F (  37.6 C)    TempSrc:  Rectal    Resp: 18  12 22   SpO2:   100% 100%     Lisa Leblanc is a 52 y.o. female and multiple episodes of nausea and vomiting over the course last 2 weeks with hypotension with a systolic of 65. Patient is mentating appropriately. Rectal temp is 99.6. Patient has had multiple falls at home. Patient responded well to the first liter of saline, patient is found to be in acute renal failure on i-STAT 8 with a creatinine of 6.07 patient also is hypokalemic at 2.7. Patient has EKG normal sinus rhythm with prolonged QTC of 513. Patient has elevated lactic acid at 2.5.   This is a shared visit with the attending physician who personally evaluated the patient and agrees with the care plan.   Pt will be admitted by Dr. Malachi Bonds to Step-down  Medications  potassium chloride 10 mEq in 100 mL IVPB (10 mEq Intravenous New Bag/Given 11/20/12 2011)  fentaNYL (SUBLIMAZE) injection 100 mcg (not administered)  sodium chloride 0.9 % bolus 1,000 mL (1,000 mLs Intravenous New Bag/Given 11/20/12 1909)  ondansetron (ZOFRAN) injection 4 mg (4 mg Intravenous Given 11/20/12 1915)  fentaNYL (SUBLIMAZE) injection 50 mcg (50 mcg Intravenous Given 11/20/12 1916)  sodium chloride 0.9 % bolus 1,000 mL (0 mLs Intravenous Stopped 11/20/12 1913)  sodium chloride 0.9 % bolus 1,000 mL (0 mLs Intravenous Stopped 11/20/12 2011)   Note: Portions of this report may have been transcribed using voice recognition software. Every effort was made to ensure accuracy; however, inadvertent computerized transcription errors may be present      Wynetta Emery, PA-C 11/21/12 1519

## 2012-11-20 NOTE — ED Notes (Signed)
Pt reports abd pain, nausea and vomiting for the past week. Had ultrasound last week at [facility], does not know the results. Has been taking phenergan without relief of symptoms

## 2012-11-20 NOTE — ED Provider Notes (Signed)
Patient reports for the past 2 weeks she's had nausea, vomiting and right upper quadrant pain. She is status post cholecystectomy. She does report decreased urinary output. She states her doctor said she had a mild elevation of her keep kidney function in the past but nothing severe. She has not had a nephrology consult before.  Patient noted to have dry lips and dry tongue. She is alert and cooperative however and is mentating well.  Medical screening examination/treatment/procedure(s) were conducted as a shared visit with non-physician practitioner(s) and myself.  I personally evaluated the patient during the encounter  Devoria Albe, MD, Franz Dell, MD 11/20/12 2051

## 2012-11-21 ENCOUNTER — Encounter (HOSPITAL_COMMUNITY): Payer: Self-pay | Admitting: Nurse Practitioner

## 2012-11-21 DIAGNOSIS — E871 Hypo-osmolality and hyponatremia: Secondary | ICD-10-CM

## 2012-11-21 DIAGNOSIS — E119 Type 2 diabetes mellitus without complications: Secondary | ICD-10-CM

## 2012-11-21 DIAGNOSIS — E876 Hypokalemia: Secondary | ICD-10-CM

## 2012-11-21 LAB — BASIC METABOLIC PANEL
Calcium: 8.1 mg/dL — ABNORMAL LOW (ref 8.4–10.5)
Creatinine, Ser: 5.24 mg/dL — ABNORMAL HIGH (ref 0.50–1.10)
GFR calc Af Amer: 10 mL/min — ABNORMAL LOW (ref >90)
GFR calc non Af Amer: 9 mL/min — ABNORMAL LOW (ref >90)
Sodium: 131 mEq/L — ABNORMAL LOW (ref 135–145)

## 2012-11-21 LAB — CREATININE, URINE, RANDOM: Creatinine, Urine: 169.9 mg/dL

## 2012-11-21 LAB — CBC
MCV: 83.3 fL (ref 78.0–100.0)
Platelets: 427 10*3/uL — ABNORMAL HIGH (ref 150–400)
RDW: 16.5 % — ABNORMAL HIGH (ref 11.5–15.5)
WBC: 5.9 10*3/uL (ref 4.0–10.5)

## 2012-11-21 LAB — URINALYSIS, ROUTINE W REFLEX MICROSCOPIC
Ketones, ur: NEGATIVE mg/dL
Nitrite: NEGATIVE
Protein, ur: NEGATIVE mg/dL
Urobilinogen, UA: 0.2 mg/dL (ref 0.0–1.0)

## 2012-11-21 LAB — SODIUM, URINE, RANDOM: Sodium, Ur: 38 mEq/L

## 2012-11-21 LAB — POCT I-STAT, CHEM 8
BUN: 74 mg/dL — ABNORMAL HIGH (ref 6–23)
Creatinine, Ser: 6.5 mg/dL — ABNORMAL HIGH (ref 0.50–1.10)
Potassium: 2.7 mEq/L — CL (ref 3.5–5.1)
Sodium: 127 mEq/L — ABNORMAL LOW (ref 135–145)

## 2012-11-21 LAB — POCT I-STAT TROPONIN I: Troponin i, poc: 0 ng/mL (ref 0.00–0.08)

## 2012-11-21 LAB — GLUCOSE, CAPILLARY
Glucose-Capillary: 95 mg/dL (ref 70–99)
Glucose-Capillary: 83 mg/dL (ref 70–99)
Glucose-Capillary: 88 mg/dL (ref 70–99)

## 2012-11-21 LAB — HEMOGLOBIN A1C: Mean Plasma Glucose: 146 mg/dL — ABNORMAL HIGH (ref <117)

## 2012-11-21 LAB — TSH: TSH: 0.48 u[IU]/mL (ref 0.350–4.500)

## 2012-11-21 LAB — MRSA PCR SCREENING: MRSA by PCR: NEGATIVE

## 2012-11-21 MED ORDER — PANTOPRAZOLE SODIUM 40 MG IV SOLR
40.0000 mg | INTRAVENOUS | Status: DC
  Administered 2012-11-21 – 2012-11-22 (×2): 40 mg via INTRAVENOUS
  Filled 2012-11-21 (×3): qty 40

## 2012-11-21 MED ORDER — INSULIN ASPART 100 UNIT/ML ~~LOC~~ SOLN
0.0000 [IU] | Freq: Three times a day (TID) | SUBCUTANEOUS | Status: DC
  Administered 2012-11-24: 13:00:00 1 [IU] via SUBCUTANEOUS

## 2012-11-21 MED ORDER — INSULIN ASPART 100 UNIT/ML ~~LOC~~ SOLN
3.0000 [IU] | Freq: Three times a day (TID) | SUBCUTANEOUS | Status: DC
  Administered 2012-11-24: 13:00:00 3 [IU] via SUBCUTANEOUS

## 2012-11-21 MED ORDER — FLEET ENEMA 7-19 GM/118ML RE ENEM: 1.0000 | ENEMA | Freq: Once | RECTAL | Status: AC | PRN

## 2012-11-21 MED ORDER — POTASSIUM CHLORIDE CRYS ER 20 MEQ PO TBCR
40.0000 meq | EXTENDED_RELEASE_TABLET | ORAL | Status: DC
  Administered 2012-11-21: 40 meq via ORAL
  Filled 2012-11-21: qty 2

## 2012-11-21 MED ORDER — ONDANSETRON HCL 4 MG PO TABS: 4.0000 mg | ORAL_TABLET | Freq: Four times a day (QID) | ORAL | Status: DC | PRN

## 2012-11-21 MED ORDER — ACETAMINOPHEN 325 MG PO TABS: 650.0000 mg | ORAL_TABLET | Freq: Four times a day (QID) | ORAL | Status: DC | PRN

## 2012-11-21 MED ORDER — CHLORHEXIDINE GLUCONATE 0.12 % MT SOLN
15.0000 mL | Freq: Two times a day (BID) | OROMUCOSAL | Status: DC
  Administered 2012-11-21: 15 mL via OROMUCOSAL
  Filled 2012-11-21: qty 15

## 2012-11-21 MED ORDER — HEPARIN SODIUM (PORCINE) 5000 UNIT/ML IJ SOLN
5000.0000 [IU] | Freq: Three times a day (TID) | INTRAMUSCULAR | Status: DC
  Administered 2012-11-21 – 2012-11-24 (×9): 5000 [IU] via SUBCUTANEOUS
  Filled 2012-11-21 (×11): qty 1

## 2012-11-21 MED ORDER — HYDROMORPHONE HCL PF 1 MG/ML IJ SOLN
1.0000 mg | INTRAMUSCULAR | Status: DC | PRN
  Administered 2012-11-22: 06:00:00 1 mg via INTRAVENOUS
  Filled 2012-11-21: qty 1

## 2012-11-21 MED ORDER — BIOTENE DRY MOUTH MT LIQD
15.0000 mL | Freq: Two times a day (BID) | OROMUCOSAL | Status: DC
  Administered 2012-11-21: 15 mL via OROMUCOSAL

## 2012-11-21 MED ORDER — ACETAMINOPHEN 650 MG RE SUPP: 650.0000 mg | Freq: Four times a day (QID) | RECTAL | Status: DC | PRN

## 2012-11-21 MED ORDER — POTASSIUM CHLORIDE 10 MEQ/100ML IV SOLN
10.0000 meq | INTRAVENOUS | Status: AC
  Administered 2012-11-21 (×4): 10 meq via INTRAVENOUS
  Filled 2012-11-21: qty 300
  Filled 2012-11-21: qty 100

## 2012-11-21 MED ORDER — ONDANSETRON HCL 4 MG/2ML IJ SOLN
4.0000 mg | Freq: Four times a day (QID) | INTRAMUSCULAR | Status: DC | PRN
  Administered 2012-11-22 – 2012-11-24 (×4): 4 mg via INTRAVENOUS
  Filled 2012-11-21 (×4): qty 2

## 2012-11-21 MED ORDER — PEG 3350-KCL-NA BICARB-NACL 420 G PO SOLR
4000.0000 mL | Freq: Once | ORAL | Status: AC
  Administered 2012-11-21: 4000 mL via ORAL

## 2012-11-21 MED ORDER — SENNOSIDES-DOCUSATE SODIUM 8.6-50 MG PO TABS
1.0000 | ORAL_TABLET | Freq: Every evening | ORAL | Status: DC | PRN
  Filled 2012-11-21: qty 1

## 2012-11-21 MED ORDER — NICOTINE 14 MG/24HR TD PT24
14.0000 mg | MEDICATED_PATCH | Freq: Every day | TRANSDERMAL | Status: DC
  Administered 2012-11-21 – 2012-11-24 (×4): 14 mg via TRANSDERMAL
  Filled 2012-11-21 (×4): qty 1

## 2012-11-21 MED ORDER — OXYCODONE HCL 5 MG PO TABS
5.0000 mg | ORAL_TABLET | ORAL | Status: DC | PRN
  Administered 2012-11-21 – 2012-11-24 (×9): 5 mg via ORAL
  Filled 2012-11-21 (×9): qty 1

## 2012-11-21 MED ORDER — SODIUM CHLORIDE 0.9 % IV SOLN
INTRAVENOUS | Status: DC
  Administered 2012-11-22: 100 mL/h via INTRAVENOUS
  Administered 2012-11-22: 22:00:00 via INTRAVENOUS
  Administered 2012-11-23: 13:00:00 100 mL/h via INTRAVENOUS
  Administered 2012-11-23: 23:00:00 via INTRAVENOUS
  Administered 2012-11-23: 08:00:00 100 mL/h via INTRAVENOUS
  Administered 2012-11-24: 09:00:00 via INTRAVENOUS

## 2012-11-21 MED ORDER — POTASSIUM CHLORIDE CRYS ER 20 MEQ PO TBCR: 40.0000 meq | EXTENDED_RELEASE_TABLET | Freq: Once | ORAL | Status: DC

## 2012-11-21 NOTE — Progress Notes (Signed)
[facility] PROGRESS NOTE  Lisa Leblanc ZOX:[id number] DOB: 15-May-1960 DOA: 11/20/2012 PCP: Dennison Mascot, MD  Assessment/Plan: ARF -Due to prerenal azotemia + ACE-I/ARB effect in the presence of copious GI losses. -Continue IVF. -Improved to 5.24 on 8/31 from 6.50 on admission. -Will hold renal US for now; can reorder if Cr fails to respond to fluids.  Hyponatremia/Hypokalemia -2/2 GI losses. -Replete. -K 2.9 today and Na has improved to 131.  Nausea/Vomiting/Severe Constipation -Likely related to functional bowel obstruction from severe constipation. -Attempting to slowly drink goLytely. -Treat symptomatically.  DM -Well controlled. -Continue current regimen.  Chronic Pain Syndrome and Narcotic Dependence -Continue long-acting narcotics and try to minimize short acting.  Code Status: Full code Family Communication: Patient only  Disposition Plan: Transfer to floor. Not medically stable for DC.   Consultants:  None   Antibiotics:  None   Subjective: Feels nauseous.  Objective: Filed Vitals:   11/21/12 0145 11/21/12 0400 11/21/12 0510 11/21/12 0800  BP: 119/87  90/48   Pulse: 86     Temp:  97.9 F (36.6 C)  97.9 F (36.6 C)  TempSrc:    Oral  Resp: 21  9   Height:      Weight:  84.3 kg (185 lb 13.6 oz)    SpO2: 100%       Intake/Output Summary (Last 24 hours) at 11/21/12 0931 Last data filed at 11/21/12 0646  Gross per 24 hour  Intake 889.58 ml  Output    525 ml  Net 364.58 ml   Filed Weights   11/20/12 2230 11/21/12 0400  Weight: 82 kg (180 lb 12.4 oz) 84.3 kg (185 lb 13.6 oz)    Exam:   General:  AA Ox3  Cardiovascular: RRR  Respiratory: CTA B  Abdomen: S/NT/ND/+BS  Extremities: no C/C/E/+pedal pulses   Neurologic:  Grossly intact and non-focal.  Data Reviewed: Basic Metabolic Panel:  Recent Labs Lab 11/20/12 1852 11/20/12 1908 11/21/12 0336  NA 128* 127* 131*  K 2.5* 2.7* 2.9*  CL 76* 85* 90*  CO2 33*   --  28  GLUCOSE 117* 119* 92  BUN 78* 74* 71*  CREATININE 6.07* 6.50* 5.24*  CALCIUM 9.8  --  8.1*  MG 2.8*  --  2.3   Liver Function Tests:  Recent Labs Lab 11/20/12 1852  AST 14  ALT 11  ALKPHOS 98  BILITOT 0.3  PROT 7.4  ALBUMIN 3.4*   No results found for this basename: LIPASE, AMYLASE,  in the last 168 hours No results found for this basename: AMMONIA,  in the last 168 hours CBC:  Recent Labs Lab 11/20/12 1852 11/20/12 1908 11/21/12 0336  WBC 8.0  --  5.9  NEUTROABS 5.6  --   --   HGB 13.4 15.6* 10.5*  HCT 39.3 46.0 31.9*  MCV 82.2  --  83.3  PLT 512*  --  427*   Cardiac Enzymes: No results found for this basename: CKTOTAL, CKMB, CKMBINDEX, TROPONINI,  in the last 168 hours BNP (last 3 results) No results found for this basename: PROBNP,  in the last 8760 hours CBG:  Recent Labs Lab 11/20/12 1842 11/21/12 0021  GLUCAP 127* 86    Recent Results (from the past 240 hour(s))  MRSA PCR SCREENING     Status: None   Collection Time    11/20/12 11:53 PM      Result Value Range Status   MRSA by PCR NEGATIVE  NEGATIVE Final   Comment:  The GeneXpert MRSA Assay (FDA     approved for NASAL specimens     only), is one component of a     comprehensive MRSA colonization     surveillance program. It is not     intended to diagnose MRSA     infection nor to guide or     monitor treatment for     MRSA infections.     Studies: Dg Chest Portable 1 View  11/20/2012   *RADIOLOGY REPORT*  Clinical Data: Abdominal pain, nausea and vomiting.  PORTABLE CHEST - 1 VIEW  Comparison: 05/03/2009  Findings: The lungs are clear.  No edema, infiltrate or pleural fluid is seen.  Heart size is at the upper limits of normal.  No bony abnormalities are seen.  IMPRESSION: No active disease.   Original Report Authenticated By: Irish Lack, M.D.   Dg Abd 2 Views  11/20/2012   *RADIOLOGY REPORT*  Clinical Data: Nausea, vomiting.  ABDOMEN - 2 VIEW  Comparison: None.   Findings: No abnormal intra-abdominal mass effect or calcification.  Nonobstructive bowel gas pattern.  Stool present in the majority of the colonic segments. No pneumoperitoneum.  Previous right obturator ring fractures, involving the superior and inferior pubic rami.   Still evident fracture lucency through the superior pubic ramus; there is bulky associated callus.  Lung bases clear.  IMPRESSION:  1.  Nonobstructive bowel gas pattern. 2.  Large stool burden. 3.  Healing right obturator ring fractures.   Original Report Authenticated By: Tiburcio Pea    Scheduled Meds: . docusate sodium  100 mg Oral BID  . heparin  5,000 Units Subcutaneous Q8H  . insulin aspart  0-9 Units Subcutaneous TID WC  . nicotine  14 mg Transdermal Daily  . OxyCODONE  60 mg Oral BID  . pantoprazole  40 mg Oral Daily  . polyethylene glycol-electrolytes  4,000 mL Oral Once  . potassium chloride  10 mEq Intravenous Q1 Hr x 4  . potassium chloride  40 mEq Oral Once  . senna  1 tablet Oral BID  . sodium chloride  3 mL Intravenous Q12H   Continuous Infusions: . sodium chloride 125 mL/hr at 11/21/12 1610    Principal Problem:   ARF (acute renal failure) Active Problems:   Obesity   Hypertension   Diabetes mellitus   Narcotic dependence   Hyponatremia   Hypokalemia   Constipation   Nausea and vomiting   Dehydration   Hypotension    Time spent: 35 minutes.    Chaya Jan  [facility] Pager [phone]  If 7PM-7AM, please contact night-coverage at [email or web address], password [facility] 11/21/2012, 9:31 AM  LOS: 1 day

## 2012-11-22 LAB — BASIC METABOLIC PANEL
GFR calc Af Amer: 19 mL/min — ABNORMAL LOW (ref >90)
GFR calc non Af Amer: 17 mL/min — ABNORMAL LOW (ref >90)
Potassium: 3.3 mEq/L — ABNORMAL LOW (ref 3.5–5.1)
Sodium: 135 mEq/L (ref 135–145)

## 2012-11-22 LAB — CBC
Hemoglobin: 10.1 g/dL — ABNORMAL LOW (ref 12.0–15.0)
MCHC: 32.8 g/dL (ref 30.0–36.0)
RBC: 3.66 MIL/uL — ABNORMAL LOW (ref 3.87–5.11)

## 2012-11-22 LAB — URINE CULTURE
Colony Count: NO GROWTH
Culture: NO GROWTH

## 2012-11-22 LAB — GLUCOSE, CAPILLARY: Glucose-Capillary: 92 mg/dL (ref 70–99)

## 2012-11-22 MED ORDER — POLYETHYLENE GLYCOL 3350 17 G PO PACK
17.0000 g | PACK | Freq: Every day | ORAL | Status: DC
  Administered 2012-11-22 – 2012-11-24 (×3): 17 g via ORAL

## 2012-11-22 MED ORDER — BOOST / RESOURCE BREEZE PO LIQD
1.0000 | Freq: Two times a day (BID) | ORAL | Status: DC
  Administered 2012-11-22: 1 via ORAL

## 2012-11-22 MED ORDER — SENNOSIDES-DOCUSATE SODIUM 8.6-50 MG PO TABS
1.0000 | ORAL_TABLET | Freq: Two times a day (BID) | ORAL | Status: DC
  Administered 2012-11-22 – 2012-11-24 (×4): 1 via ORAL
  Filled 2012-11-22 (×6): qty 1

## 2012-11-22 MED ORDER — ADULT MULTIVITAMIN W/MINERALS CH
1.0000 | ORAL_TABLET | Freq: Every day | ORAL | Status: DC
  Administered 2012-11-22 – 2012-11-24 (×3): 1 via ORAL
  Filled 2012-11-22 (×3): qty 1

## 2012-11-22 MED ORDER — FLEET ENEMA 7-19 GM/118ML RE ENEM
1.0000 | ENEMA | Freq: Every day | RECTAL | Status: DC | PRN
  Administered 2012-11-22: 16:00:00 1 via RECTAL
  Filled 2012-11-22: qty 1

## 2012-11-22 MED ORDER — MAGNESIUM CITRATE PO SOLN
1.0000 | Freq: Every day | ORAL | Status: DC
  Administered 2012-11-22 – 2012-11-23 (×2): 1 via ORAL

## 2012-11-22 MED ORDER — MAGNESIUM CITRATE PO SOLN
1.0000 | Freq: Once | ORAL | Status: DC
  Filled 2012-11-22: qty 296

## 2012-11-22 NOTE — ED Provider Notes (Signed)
See prior note   Ward Givens, MD 11/22/12 [phone]

## 2012-11-22 NOTE — Progress Notes (Signed)
INITIAL NUTRITION ASSESSMENT  DOCUMENTATION CODES Per approved criteria  -Obesity Unspecified   INTERVENTION: Provide Resource Breeze BID until nausea, appetite, and PO intake improve Encourage PO intake Provide Multivitamin with minerals daily  NUTRITION DIAGNOSIS: Inadequate oral intake related to nausea and pain as evidenced by 64 lb wt loss in 2 weeks per pt's report (20% wt loss in 1 year per wt history).  Goal: Pt to meet >/= 90% of their estimated nutrition needs   Monitor:  PO intake Weight Labs  Reason for Assessment: Malnutrition Screening Tool, score of 3  52 y.o. female  Admitting Dx: ARF (acute renal failure)  ASSESSMENT: 52 y.o. year-old female with history of cholecystectomy 2 years ago, chronic pain on long-acting narcotics, HTN on multiple ACEIs and an ARB, chronic 2L [place] without obstructive lung disease, diabetes diagnosed 8 months ago who presents with abdominal pain, weakness, and falls. The patient was last at their baseline health several months ago. She states that she developed severe constipation and despite numerous over the counter medications only in the last 4 days has she passed any stool. She has had 4 very small and hard BMs this week. She has had progressive cramping RUQ abdominal pain and distension with radiation to her back. For the last two weeks, she has had severe nausea and vomiting, vomiting clear to yellow fluid about 10 times per day and inability to tolerate liquids or solids.  Pt reports poor appetite. Pt states she tried drinking clears at breakfast but, she continues to have nausea and vomiting. Pt reports not eating well PTA due to nausea. She states 2 weeks ago she weighed 239 lbs- per pt she used to weigh 160 lbs but, due to fluid retention gained up to 250 lbs. She reports following a carb modified diet PTA and has no questions or concerns regarding diet. She complains of feeling very weak and nauseous. Pt's diet was advanced to carb  modified; she states she will try to eat solid food but, just requests a popsicle at this time.  Height: Ht Readings from Last 1 Encounters:  11/20/12 5\' 4"  (1.626 m)    Weight: Wt Readings from Last 1 Encounters:  11/21/12 185 lb 13.6 oz (84.3 kg)    Ideal Body Weight: 120 lbs  % Ideal Body Weight: 154%  Wt Readings from Last 10 Encounters:  11/21/12 185 lb 13.6 oz (84.3 kg)  12/04/11 233 lb (105.688 kg)  11/26/11 218 lb 3.2 oz (98.975 kg)    Usual Body Weight: 160 lbs  % Usual Body Weight: 116%  BMI:  Body mass index is 31.89 kg/(m^2).  Estimated Nutritional Needs: Kcal: 1680-1850 Protein: 70-80 grams Fluid: 2.4 L/day  Skin: WDL  Diet Order: Carb Control  EDUCATION NEEDS: -No education needs identified at this time   Intake/Output Summary (Last 24 hours) at 11/22/12 1358 Last data filed at 11/22/12 0615  Gross per 24 hour  Intake   1795 ml  Output   1800 ml  Net     -5 ml    Last BM: 8/31   Labs:   Recent Labs Lab 11/20/12 1852 11/20/12 1908 11/21/12 0336 11/22/12 0432  NA 128* 127* 131* 135  K 2.5* 2.7* 2.9* 3.3*  CL 76* 85* 90* 98  CO2 33*  --  28 26  BUN 78* 74* 71* 50*  CREATININE 6.07* 6.50* 5.24* 3.03*  CALCIUM 9.8  --  8.1* 8.4  MG 2.8*  --  2.3  --   GLUCOSE 117*  119* 92 77    CBG (last 3)   Recent Labs  11/21/12 1554 11/22/12 0731 11/22/12 1211  GLUCAP 88 92 92    Scheduled Meds: . heparin  5,000 Units Subcutaneous Q8H  . insulin aspart  0-9 Units Subcutaneous TID WC  . insulin aspart  3 Units Subcutaneous TID WC  . nicotine  14 mg Transdermal Daily  . pantoprazole (PROTONIX) IV  40 mg Intravenous Q24H  . polyethylene glycol  17 g Oral Daily    Continuous Infusions: . sodium chloride 100 mL/hr (11/22/12 1213)    Past Medical History  Diagnosis Date  . Obesity   . Hypertension   . Lumbosacral neuritis   . Chronic pain   . Rupture of rotator cuff, complete   . Diabetes mellitus   . Narcotic dependence      Past Surgical History  Procedure Laterality Date  . Left knee arthroscopy    . Cholecystectomy  2012    Ian Malkin RD, LDN Inpatient Clinical Dietitian Pager: [phone] After Hours Pager: [phone]

## 2012-11-22 NOTE — Progress Notes (Signed)
[facility] PROGRESS NOTE  Lisa Leblanc:[id number] DOB: 08/01/1960 DOA: 11/20/2012 PCP: Dennison Mascot, MD  Assessment/Plan: ARF -Due to prerenal azotemia + ACE-I/ARB effect in the presence of copious GI losses. -Continue IVF. -Improved to 3.03 on 9/1 from 5.24 on 8/31 from 6.50 on admission.  Hyponatremia/Hypokalemia -2/2 GI losses. -Hyponatremia corrected. -K improved to 3.3. Continue to replete PO today.  Nausea/Vomiting/Severe Constipation -Likely related to functional bowel obstruction from severe constipation. -Attempting to slowly drink goLytely. -Will add miralax, fleet enema and should be allowed to drink prune juice a couple of times a day.  DM -Well controlled. -Continue current regimen.  Chronic Pain Syndrome and Narcotic Dependence -Continue long-acting narcotics and try to minimize short acting.  Code Status: Full code Family Communication: Patient only  Disposition Plan: Home when medically ready.   Consultants:  None   Antibiotics:  None   Subjective: Feels nauseous, but improved from yesterday. Wants to try a regular diet (she thinks she will be able to tolerate that better than the clears).  Objective: Filed Vitals:   11/21/12 1200 11/21/12 1600 11/21/12 2125 11/22/12 0500  BP: 113/77 121/79 135/91 119/71  Pulse: 86 77 85 90  Temp: 97.9 F (36.6 C) 97.7 F (36.5 C) 97.9 F (36.6 C) 98.8 F (37.1 C)  TempSrc: Oral Axillary Oral Oral  Resp: 18 7 20 20   Height:      Weight:      SpO2: 100% 99% 94% 100%    Intake/Output Summary (Last 24 hours) at 11/22/12 1602 Last data filed at 11/22/12 0615  Gross per 24 hour  Intake   1420 ml  Output   1475 ml  Net    -55 ml   Filed Weights   11/20/12 2230 11/21/12 0400  Weight: 82 kg (180 lb 12.4 oz) 84.3 kg (185 lb 13.6 oz)    Exam:   General:  AA Ox3  Cardiovascular: RRR  Respiratory: CTA B  Abdomen: S/NT/ND/+BS  Extremities: no C/C/E/+pedal pulses   Neurologic:   Grossly intact and non-focal.  Data Reviewed: Basic Metabolic Panel:  Recent Labs Lab 11/20/12 1852 11/20/12 1908 11/21/12 0336 11/22/12 0432  NA 128* 127* 131* 135  K 2.5* 2.7* 2.9* 3.3*  CL 76* 85* 90* 98  CO2 33*  --  28 26  GLUCOSE 117* 119* 92 77  BUN 78* 74* 71* 50*  CREATININE 6.07* 6.50* 5.24* 3.03*  CALCIUM 9.8  --  8.1* 8.4  MG 2.8*  --  2.3  --    Liver Function Tests:  Recent Labs Lab 11/20/12 1852  AST 14  ALT 11  ALKPHOS 98  BILITOT 0.3  PROT 7.4  ALBUMIN 3.4*   No results found for this basename: LIPASE, AMYLASE,  in the last 168 hours No results found for this basename: AMMONIA,  in the last 168 hours CBC:  Recent Labs Lab 11/20/12 1852 11/20/12 1908 11/21/12 0336 11/22/12 0432  WBC 8.0  --  5.9 6.3  NEUTROABS 5.6  --   --   --   HGB 13.4 15.6* 10.5* 10.1*  HCT 39.3 46.0 31.9* 30.8*  MCV 82.2  --  83.3 84.2  PLT 512*  --  427* 395   Cardiac Enzymes: No results found for this basename: CKTOTAL, CKMB, CKMBINDEX, TROPONINI,  in the last 168 hours BNP (last 3 results) No results found for this basename: PROBNP,  in the last 8760 hours CBG:  Recent Labs Lab 11/21/12 0902 11/21/12 1159 11/21/12 1554 11/22/12 0731 11/22/12  1211  GLUCAP 95 83 88 92 92    Recent Results (from the past 240 hour(s))  MRSA PCR SCREENING     Status: None   Collection Time    11/20/12 11:53 PM      Result Value Range Status   MRSA by PCR NEGATIVE  NEGATIVE Final   Comment:            The GeneXpert MRSA Assay (FDA     approved for NASAL specimens     only), is one component of a     comprehensive MRSA colonization     surveillance program. It is not     intended to diagnose MRSA     infection nor to guide or     monitor treatment for     MRSA infections.  URINE CULTURE     Status: None   Collection Time    11/21/12 12:23 AM      Result Value Range Status   Specimen Description URINE, CATHETERIZED   Final   Special Requests Normal   Final    Culture  Setup Time     Final   Value: 11/21/2012 15:10     Performed at [facility] Count     Final   Value: NO GROWTH     Performed at [facility]   Culture     Final   Value: NO GROWTH     Performed at [facility]   Report Status 11/22/2012 FINAL   Final     Studies: Dg Chest Portable 1 View  11/20/2012   *RADIOLOGY REPORT*  Clinical Data: Abdominal pain, nausea and vomiting.  PORTABLE CHEST - 1 VIEW  Comparison: 05/03/2009  Findings: The lungs are clear.  No edema, infiltrate or pleural fluid is seen.  Heart size is at the upper limits of normal.  No bony abnormalities are seen.  IMPRESSION: No active disease.   Original Report Authenticated By: Irish Lack, M.D.   Dg Abd 2 Views  11/20/2012   *RADIOLOGY REPORT*  Clinical Data: Nausea, vomiting.  ABDOMEN - 2 VIEW  Comparison: None.  Findings: No abnormal intra-abdominal mass effect or calcification.  Nonobstructive bowel gas pattern.  Stool present in the majority of the colonic segments. No pneumoperitoneum.  Previous right obturator ring fractures, involving the superior and inferior pubic rami.   Still evident fracture lucency through the superior pubic ramus; there is bulky associated callus.  Lung bases clear.  IMPRESSION:  1.  Nonobstructive bowel gas pattern. 2.  Large stool burden. 3.  Healing right obturator ring fractures.   Original Report Authenticated By: Tiburcio Pea    Scheduled Meds: . feeding supplement  1 Container Oral BID BM  . heparin  5,000 Units Subcutaneous Q8H  . insulin aspart  0-9 Units Subcutaneous TID WC  . insulin aspart  3 Units Subcutaneous TID WC  . multivitamin with minerals  1 tablet Oral Daily  . nicotine  14 mg Transdermal Daily  . pantoprazole (PROTONIX) IV  40 mg Intravenous Q24H  . polyethylene glycol  17 g Oral Daily   Continuous Infusions: . sodium chloride 100 mL/hr (11/22/12 1213)    Principal Problem:   ARF (acute renal failure) Active  Problems:   Obesity   Hypertension   Diabetes mellitus   Narcotic dependence   Hyponatremia   Hypokalemia   Constipation   Nausea and vomiting   Dehydration   Hypotension    Time spent: 35 minutes.  Chaya Jan  [facility] Pager [phone]  If 7PM-7AM, please contact night-coverage at [email or web address], password [facility] 11/22/2012, 4:02 PM  LOS: 2 days

## 2012-11-23 LAB — CBC
HCT: 30.9 % — ABNORMAL LOW (ref 36.0–46.0)
Hemoglobin: 10.1 g/dL — ABNORMAL LOW (ref 12.0–15.0)
MCHC: 32.7 g/dL (ref 30.0–36.0)
MCV: 84.2 fL (ref 78.0–100.0)
RDW: 16.7 % — ABNORMAL HIGH (ref 11.5–15.5)

## 2012-11-23 LAB — BASIC METABOLIC PANEL
BUN: 31 mg/dL — ABNORMAL HIGH (ref 6–23)
CO2: 26 mEq/L (ref 19–32)
Chloride: 103 mEq/L (ref 96–112)
Creatinine, Ser: 1.82 mg/dL — ABNORMAL HIGH (ref 0.50–1.10)
Potassium: 2.8 mEq/L — ABNORMAL LOW (ref 3.5–5.1)

## 2012-11-23 LAB — GLUCOSE, CAPILLARY: Glucose-Capillary: 92 mg/dL (ref 70–99)

## 2012-11-23 MED ORDER — POTASSIUM CHLORIDE CRYS ER 20 MEQ PO TBCR
40.0000 meq | EXTENDED_RELEASE_TABLET | ORAL | Status: AC
  Administered 2012-11-23 (×3): 40 meq via ORAL
  Filled 2012-11-23 (×3): qty 2

## 2012-11-23 MED ORDER — PANTOPRAZOLE SODIUM 40 MG PO TBEC
40.0000 mg | DELAYED_RELEASE_TABLET | Freq: Every day | ORAL | Status: DC
  Administered 2012-11-23 – 2012-11-24 (×2): 40 mg via ORAL
  Filled 2012-11-23 (×2): qty 1

## 2012-11-23 NOTE — Progress Notes (Signed)
[facility] PROGRESS NOTE  AUNISTY REALI ZDG:[id number] DOB: Feb 18, 1961 DOA: 11/20/2012 PCP: Dennison Mascot, MD  Assessment/Plan: ARF -Due to prerenal azotemia + ACE-I/ARB effect in the presence of copious GI losses. -Continue IVF. -Improved to 1.82 on 9/2 from 3.03 on 9/1 from 5.24 on 8/31 from 6.50 on admission.   Hyponatremia/Hypokalemia -2/2 GI losses. -Hyponatremia corrected. -Continue to replete PO today.  Nausea/Vomiting/Severe Constipation -Likely related to functional bowel obstruction from severe constipation. -Attempting to slowly drink goLytely. -Will add miralax, mag citrate, fleet enema and should be allowed to drink prune juice a couple of times a day.  DM -Well controlled. -Continue current regimen.  Chronic Pain Syndrome and Narcotic Dependence -Continue long-acting narcotics and try to minimize short acting.  Code Status: Full code Family Communication: Patient only  Disposition Plan: Home when medically ready.   Consultants:  None   Antibiotics:  None   Subjective: Feels nauseous, but improved from yesterday. Wants to try a regular diet (she thinks she will be able to tolerate that better than the clears).  Objective: Filed Vitals:   11/22/12 0500 11/22/12 2100 11/23/12 0530 11/23/12 1500  BP: 119/71 136/88 136/90 137/90  Pulse: 90 86 79 80  Temp: 98.8 F (37.1 C) 99.6 F (37.6 C) 99.3 F (37.4 C) 97.6 F (36.4 C)  TempSrc: Oral Oral Oral Oral  Resp: 20 20 20 22   Height:      Weight:      SpO2: 100% 100% 100% 100%    Intake/Output Summary (Last 24 hours) at 11/23/12 1722 Last data filed at 11/23/12 0753  Gross per 24 hour  Intake 2828.33 ml  Output    550 ml  Net 2278.33 ml   Filed Weights   11/20/12 2230 11/21/12 0400  Weight: 82 kg (180 lb 12.4 oz) 84.3 kg (185 lb 13.6 oz)    Exam:   General:  AA Ox3  Cardiovascular: RRR  Respiratory: CTA B  Abdomen: S/NT/ND/+BS  Extremities: no C/C/E/+pedal pulses    Neurologic:  Grossly intact and non-focal.  Data Reviewed: Basic Metabolic Panel:  Recent Labs Lab 11/20/12 1852 11/20/12 1908 11/21/12 0336 11/22/12 0432 11/23/12 0500  NA 128* 127* 131* 135 138  K 2.5* 2.7* 2.9* 3.3* 2.8*  CL 76* 85* 90* 98 103  CO2 33*  --  28 26 26   GLUCOSE 117* 119* 92 77 82  BUN 78* 74* 71* 50* 31*  CREATININE 6.07* 6.50* 5.24* 3.03* 1.82*  CALCIUM 9.8  --  8.1* 8.4 8.8  MG 2.8*  --  2.3  --   --    Liver Function Tests:  Recent Labs Lab 11/20/12 1852  AST 14  ALT 11  ALKPHOS 98  BILITOT 0.3  PROT 7.4  ALBUMIN 3.4*   No results found for this basename: LIPASE, AMYLASE,  in the last 168 hours No results found for this basename: AMMONIA,  in the last 168 hours CBC:  Recent Labs Lab 11/20/12 1852 11/20/12 1908 11/21/12 0336 11/22/12 0432 11/23/12 0500  WBC 8.0  --  5.9 6.3 5.5  NEUTROABS 5.6  --   --   --   --   HGB 13.4 15.6* 10.5* 10.1* 10.1*  HCT 39.3 46.0 31.9* 30.8* 30.9*  MCV 82.2  --  83.3 84.2 84.2  PLT 512*  --  427* 395 402*   Cardiac Enzymes: No results found for this basename: CKTOTAL, CKMB, CKMBINDEX, TROPONINI,  in the last 168 hours BNP (last 3 results) No results found for this  basename: PROBNP,  in the last 8760 hours CBG:  Recent Labs Lab 11/22/12 0731 11/22/12 1211 11/22/12 1627 11/23/12 0701 11/23/12 1642  GLUCAP 92 92 95 81 92    Recent Results (from the past 240 hour(s))  MRSA PCR SCREENING     Status: None   Collection Time    11/20/12 11:53 PM      Result Value Range Status   MRSA by PCR NEGATIVE  NEGATIVE Final   Comment:            The GeneXpert MRSA Assay (FDA     approved for NASAL specimens     only), is one component of a     comprehensive MRSA colonization     surveillance program. It is not     intended to diagnose MRSA     infection nor to guide or     monitor treatment for     MRSA infections.  URINE CULTURE     Status: None   Collection Time    11/21/12 12:23 AM       Result Value Range Status   Specimen Description URINE, CATHETERIZED   Final   Special Requests Normal   Final   Culture  Setup Time     Final   Value: 11/21/2012 15:10     Performed at [facility] Count     Final   Value: NO GROWTH     Performed at [facility]   Culture     Final   Value: NO GROWTH     Performed at [facility]   Report Status 11/22/2012 FINAL   Final     Studies: No results found.  Scheduled Meds: . feeding supplement  1 Container Oral BID BM  . heparin  5,000 Units Subcutaneous Q8H  . insulin aspart  0-9 Units Subcutaneous TID WC  . insulin aspart  3 Units Subcutaneous TID WC  . magnesium citrate  1 Bottle Oral Once  . magnesium citrate  1 Bottle Oral Daily  . multivitamin with minerals  1 tablet Oral Daily  . nicotine  14 mg Transdermal Daily  . pantoprazole  40 mg Oral Daily  . polyethylene glycol  17 g Oral Daily  . potassium chloride  40 mEq Oral Q4H  . senna-docusate  1 tablet Oral BID   Continuous Infusions: . sodium chloride 100 mL/hr (11/23/12 1321)    Principal Problem:   ARF (acute renal failure) Active Problems:   Obesity   Hypertension   Diabetes mellitus   Narcotic dependence   Hyponatremia   Hypokalemia   Constipation   Nausea and vomiting   Dehydration   Hypotension    Time spent: 35 minutes.    Chaya Jan  [facility] Pager [phone]  If 7PM-7AM, please contact night-coverage at [email or web address], password [facility] 11/23/2012, 5:22 PM  LOS: 3 days

## 2012-11-23 NOTE — Progress Notes (Signed)
This patient is receiving Protonix. Based on criteria approved by the Pharmacy and Therapeutics Committee, this medication is being converted to the equivalent oral dose form. These criteria include:   . The patient is eating (either orally or per tube) and/or has been taking other orally administered medications for at least 24 hours.  . This patient has no evidence of active gastrointestinal bleeding or impaired GI absorption (gastrectomy, short bowel, patient on TNA or NPO).   If you have questions about this conversion, please contact the pharmacy department.  Berkley Harvey, [facility] 11/23/2012 8:43 AM

## 2012-11-24 LAB — BASIC METABOLIC PANEL
CO2: 25 mEq/L (ref 19–32)
Calcium: 8.9 mg/dL (ref 8.4–10.5)
Chloride: 103 mEq/L (ref 96–112)
Creatinine, Ser: 1.37 mg/dL — ABNORMAL HIGH (ref 0.50–1.10)
Glucose, Bld: 102 mg/dL — ABNORMAL HIGH (ref 70–99)
Sodium: 138 mEq/L (ref 135–145)

## 2012-11-24 LAB — CBC
HCT: 30.3 % — ABNORMAL LOW (ref 36.0–46.0)
MCH: 27.9 pg (ref 26.0–34.0)
MCV: 84.4 fL (ref 78.0–100.0)
Platelets: 390 10*3/uL (ref 150–400)
RBC: 3.59 MIL/uL — ABNORMAL LOW (ref 3.87–5.11)
WBC: 6.1 10*3/uL (ref 4.0–10.5)

## 2012-11-24 LAB — GLUCOSE, CAPILLARY
Glucose-Capillary: 111 mg/dL — ABNORMAL HIGH (ref 70–99)
Glucose-Capillary: 93 mg/dL (ref 70–99)
Glucose-Capillary: 88 mg/dL (ref 70–99)

## 2012-11-24 MED ORDER — POTASSIUM CHLORIDE CRYS ER 20 MEQ PO TBCR
40.0000 meq | EXTENDED_RELEASE_TABLET | Freq: Once | ORAL | Status: AC
  Administered 2012-11-24: 14:00:00 40 meq via ORAL
  Filled 2012-11-24: qty 2

## 2012-11-24 MED ORDER — HYDRALAZINE HCL 10 MG PO TABS: 10.0000 mg | ORAL_TABLET | Freq: Three times a day (TID) | ORAL | Status: DC

## 2012-11-24 MED ORDER — SENNOSIDES-DOCUSATE SODIUM 8.6-50 MG PO TABS: 1.0000 | ORAL_TABLET | Freq: Two times a day (BID) | ORAL | Status: DC

## 2012-11-24 MED ORDER — HYDRALAZINE HCL 10 MG PO TABS
10.0000 mg | ORAL_TABLET | Freq: Three times a day (TID) | ORAL | Status: DC
  Administered 2012-11-24: 14:00:00 10 mg via ORAL
  Filled 2012-11-24 (×3): qty 1

## 2012-11-24 NOTE — Progress Notes (Signed)
Patient ID: Lisa Leblanc, female   DOB: Feb 15, 1961, 52 y.o.   MRN: [id number] [facility] PROGRESS NOTE  Lisa Leblanc WUJ:[id number] DOB: December 21, 1960 DOA: 11/20/2012 PCP: Dennison Mascot, MD  Brief narrative: 52 y.o. year-old female with history of cholecystectomy 2 years ago, chronic pain on long-acting narcotics, HTN on multiple ACEIs and an ARB, chronic 2L [place] without obstructive lung disease, diabetes diagnosed 8 months ago, who presented with abdominal pain, weakness, and falls. The patient was last at her baseline health several months ago. She states that she developed severe constipation and despite numerous over the counter medications with no significant improvement. This was associated with right upper quadrant abdominal cramping that occasionally radiates to her right side of the back.  In the ER, she was initially hypotensive with BP 65/49 which improved after several liters of IVF. Sodium 128, potassium 2.5, chloride 76, bicarb 33, BUn 78, Cr 6.07, Plt 512, CXR NAD, KUB with copious stool and no evidence of obstruction. She was admitted for severe dehydration and AKI.   Principal Problem:   ARF (acute renal failure) - this was determined to be secondary to pre renal etiology and azotemia from nephrotoxins: ACEI's/ARB's - resolving, creatinine is trending down - continue to hold her home BP meds: Lisinopril, Benazepril, HCTZ, Losartan - repeat BMP in AM Active Problems:   Moderate malnutrition in the setting of obesity - dietary recommendations provided    Hypertension - will continue to hold home BP medications due to renal failure - will start Hydralazine and see how pt responds    Diabetes mellitus - reasonable inpatient control - A1C 6.7 - continuing SSI while inpatient and will will continue home regimen upon discharge    Narcotic dependence - discussed side effects of constipation   Hyponatremia - secondary to pre renal etiology, IVF provided and pt  responded well  - now resolved   Hypokalemia - supplemented, still slightly low this AM - continue to supplement and recheck BMP in AM   Constipation - continue bowel regimen   Hypotension - secondary to poor oral intake - IVF provided and this is now resolved, pt tolerating current diet well    Anemia of chronic disease - Hg and Hct overall stable, no signs of active bleed Consultants:  None  Procedures/Studies:  None  Antibiotics:  None  Code Status: Full Family Communication: Pt at bedside Disposition Plan: Home in AM  HPI/Subjective: No events overnight.   Objective: Filed Vitals:   11/23/12 1500 11/23/12 2130 11/24/12 0556 11/24/12 1023  BP: 137/90 135/91 150/98 147/101  Pulse: 80 96 96   Temp: 97.6 F (36.4 C) 98.8 F (37.1 C) 98.4 F (36.9 C)   TempSrc: Oral Oral Oral   Resp: 22 20 18    Height:      Weight:      SpO2: 100% 100% 100%     Intake/Output Summary (Last 24 hours) at 11/24/12 1135 Last data filed at 11/24/12 1100  Gross per 24 hour  Intake    480 ml  Output   1700 ml  Net  -1220 ml    Exam:   General:  Pt is alert, follows commands appropriately, not in acute distress  Cardiovascular: Regular rate and rhythm, S1/S2, no murmurs, no rubs, no gallops  Respiratory: Clear to auscultation bilaterally, no wheezing, no crackles, no rhonchi  Abdomen: Soft, tender in RUQ, non distended, bowel sounds present, no guarding  Extremities: No edema, pulses DP and PT palpable bilaterally  Neuro: Grossly nonfocal  Data Reviewed: Basic Metabolic Panel:  Recent Labs Lab 11/20/12 1852 11/20/12 1908 11/21/12 0336 11/22/12 0432 11/23/12 0500 11/24/12 0422  NA 128* 127* 131* 135 138 138  K 2.5* 2.7* 2.9* 3.3* 2.8* 3.2*  CL 76* 85* 90* 98 103 103  CO2 33*  --  28 26 26 25   GLUCOSE 117* 119* 92 77 82 102*  BUN 78* 74* 71* 50* 31* 17  CREATININE 6.07* 6.50* 5.24* 3.03* 1.82* 1.37*  CALCIUM 9.8  --  8.1* 8.4 8.8 8.9  MG 2.8*  --  2.3  --    --   --    Liver Function Tests:  Recent Labs Lab 11/20/12 1852  AST 14  ALT 11  ALKPHOS 98  BILITOT 0.3  PROT 7.4  ALBUMIN 3.4*   CBC:  Recent Labs Lab 11/20/12 1852 11/20/12 1908 11/21/12 0336 11/22/12 0432 11/23/12 0500 11/24/12 0422  WBC 8.0  --  5.9 6.3 5.5 6.1  NEUTROABS 5.6  --   --   --   --   --   HGB 13.4 15.6* 10.5* 10.1* 10.1* 10.0*  HCT 39.3 46.0 31.9* 30.8* 30.9* 30.3*  MCV 82.2  --  83.3 84.2 84.2 84.4  PLT 512*  --  427* 395 402* 390   CBG:  Recent Labs Lab 11/22/12 0731 11/22/12 1211 11/22/12 1627 11/23/12 0701 11/23/12 1642  GLUCAP 92 92 95 81 92    Recent Results (from the past 240 hour(s))  MRSA PCR SCREENING     Status: None   Collection Time    11/20/12 11:53 PM      Result Value Range Status   MRSA by PCR NEGATIVE  NEGATIVE Final   Comment:            The GeneXpert MRSA Assay (FDA     approved for NASAL specimens     only), is one component of a     comprehensive MRSA colonization     surveillance program. It is not     intended to diagnose MRSA     infection nor to guide or     monitor treatment for     MRSA infections.  URINE CULTURE     Status: None   Collection Time    11/21/12 12:23 AM      Result Value Range Status   Specimen Description URINE, CATHETERIZED   Final   Special Requests Normal   Final   Culture  Setup Time     Final   Value: 11/21/2012 15:10     Performed at [facility] Count     Final   Value: NO GROWTH     Performed at [facility]   Culture     Final   Value: NO GROWTH     Performed at [facility]   Report Status 11/22/2012 FINAL   Final     Scheduled Meds: . feeding supplement  1 Container Oral BID BM  . heparin  5,000 Units Subcutaneous Q8H  . insulin aspart  0-9 Units Subcutaneous TID WC  . insulin aspart  3 Units Subcutaneous TID WC  . magnesium citrate  1 Bottle Oral Once  . magnesium citrate  1 Bottle Oral Daily  . multivitamin with minerals  1  tablet Oral Daily  . nicotine  14 mg Transdermal Daily  . pantoprazole  40 mg Oral Daily  . polyethylene glycol  17 g Oral Daily  . senna-docusate  1 tablet  Oral BID   Continuous Infusions: . sodium chloride 100 mL/hr at 11/24/12 8295     Debbora Presto, MD  [facility] Pager [phone]  If 7PM-7AM, please contact night-coverage [email or web address] Password [email or web address] 11/24/2012, 11:35 AM   LOS: 4 days

## 2012-11-24 NOTE — Discharge Summary (Signed)
Physician Discharge Summary  ESTREYA CLAY WUJ:[id number] DOB: 28-Jan-1961 DOA: 11/20/2012  PCP: Dennison Mascot, MD  Admit date: 11/20/2012 Discharge date: 11/24/2012  Recommendations for Outpatient Follow-up:  1. Pt will need to follow up with PCP in 1-2 weeks post discharge 2. Please obtain BMP to evaluate electrolytes and kidney function 3. Please note that pt was given one dose of K-dur 40 MEQ prior to discharge so please also check potassium level  4. Please also check CBC to evaluate Hg and Hct levels 5. Please note that creatinine on admission > 6 and pt hypotensive 6. Lisinopril, HCTZ, Losartan and Benazepril all stopped and Hydralazine started instead so the dose may need to be readjusted as indicated   Discharge Diagnoses:  Principal Problem:   ARF (acute renal failure) Active Problems:   Obesity   Hypertension   Diabetes mellitus   Narcotic dependence   Hyponatremia   Hypokalemia   Constipation   Nausea and vomiting   Dehydration   Hypotension  Discharge Condition: Stable  Diet recommendation: Heart healthy diet discussed in details   Brief narrative:  52 y.o. year-old female with history of cholecystectomy 2 years ago, chronic pain on long-acting narcotics, HTN on multiple ACEIs and an ARB, chronic 2L [place] without obstructive lung disease, diabetes diagnosed 8 months ago, who presented with abdominal pain, weakness, and falls. The patient was last at her baseline health several months ago. She states that she developed severe constipation and despite numerous over the counter medications with no significant improvement. This was associated with right upper quadrant abdominal cramping that occasionally radiates to her right side of the back.   In the ER, she was initially hypotensive with BP 65/49 which improved after several liters of IVF. Sodium 128, potassium 2.5, chloride 76, bicarb 33, BUn 78, Cr 6.07, Plt 512, CXR NAD, KUB with copious stool and no evidence of  obstruction. She was admitted for severe dehydration and AKI.   Principal Problem:  ARF (acute renal failure)  - this was determined to be secondary to pre renal etiology and azotemia from nephrotoxins: ACEI's/ARB's  - resolving, creatinine is trending down  - continue to hold her home BP meds: Lisinopril, Benazepril, HCTZ, Losartan  - repeat BMP in 1-2 weeks - Hydralazine started for B control  Active Problems:  Moderate malnutrition in the setting of obesity  - dietary recommendations provided  Hypertension  - Please note that ACEI's and ARB's discontinued due to hypotension and acute renal failure  - Creatinine on admission > 6, now trending down to 1.3 - started Hydralazine  Diabetes mellitus  - reasonable inpatient control  - A1C 6.7  - continuing SSI while inpatient and will will continue home regimen upon discharge  Narcotic dependence  - discussed side effects of constipation  Hyponatremia  - secondary to pre renal etiology, IVF provided and pt responded well  - now resolved  Hypokalemia  - supplemented, still slightly low this AM  - please note that pt was given one dose of K-dur 40 MEQ PO prior to discharge nad BMP needs to be repeated in PCP office in 1-2 weeks  Constipation  - continue bowel regimen  Hypotension  - secondary to poor oral intake  - IVF provided and this is now resolved, pt tolerating current diet well  - please note changes in antihypertensive regimen noted below Anemia of chronic disease  - Hg and Hct overall stable, no signs of active bleed   Consultants:  None Procedures/Studies:  None Antibiotics:  None  Code Status: Full  Family Communication: Pt at bedside   Discharge Exam: Filed Vitals:   11/24/12 1348  BP: 154/102  Pulse: 96  Temp: 98.3 F (36.8 C)  Resp:    Filed Vitals:   11/23/12 2130 11/24/12 0556 11/24/12 1023 11/24/12 1348  BP: 135/91 150/98 147/101 154/102  Pulse: 96 96  96  Temp: 98.8 F (37.1 C) 98.4 F (36.9  C)  98.3 F (36.8 C)  TempSrc: Oral Oral  Oral  Resp: 20 18    Height:      Weight:      SpO2: 100% 100%  100%    General: Pt is alert, follows commands appropriately, not in acute distress Cardiovascular: Regular rate and rhythm, S1/S2 +, no murmurs, no rubs, no gallops Respiratory: Clear to auscultation bilaterally, no wheezing, no crackles, no rhonchi Abdominal: Soft, non tender, non distended, bowel sounds +, no guarding Extremities: no edema, no cyanosis, pulses palpable bilaterally DP and PT Neuro: Grossly nonfocal  Discharge Instructions  Discharge Orders   Future Orders Complete By Expires   Diet - low sodium heart healthy  As directed    Increase activity slowly  As directed        Medication List    STOP taking these medications       benazepril 20 MG tablet  Commonly known as:  LOTENSIN     lisinopril-hydrochlorothiazide 20-12.5 MG per tablet  Commonly known as:  PRINZIDE,ZESTORETIC     losartan 25 MG tablet  Commonly known as:  COZAAR      TAKE these medications       glipiZIDE 5 MG 24 hr tablet  Commonly known as:  GLUCOTROL XL  Take 5 mg by mouth daily.     hydrALAZINE 10 MG tablet  Commonly known as:  APRESOLINE  Take 1 tablet (10 mg total) by mouth every 8 (eight) hours.     insulin detemir 100 UNIT/ML injection  Commonly known as:  LEVEMIR  Inject 45 Units into the skin at bedtime.     omeprazole 20 MG capsule  Commonly known as:  PRILOSEC  Take 20 mg by mouth daily.     oxyCODONE 20 MG 12 hr tablet  Commonly known as:  OXYCONTIN  Take 60 mg by mouth every 12 (twelve) hours. pain     promethazine 25 MG tablet  Commonly known as:  PHENERGAN  Take 25 mg by mouth every 6 (six) hours as needed for nausea (nausea).     senna-docusate 8.6-50 MG per tablet  Commonly known as:  Senokot-S  Take 1 tablet by mouth 2 (two) times daily.           Follow-up Information   Follow up with MORRISEY,LEMONT, MD In 2 weeks.   Specialty:  Family  Medicine   Contact information:   [facility] [address] [place] [address] [phone]       Follow up with Debbora Presto, MD. (As needed if symptoms worsen)    Specialty:  Internal Medicine   Contact information:   [address] [place] [address] [phone]        The results of significant diagnostics from this hospitalization (including imaging, microbiology, ancillary and laboratory) are listed below for reference.     Microbiology: Recent Results (from the past 240 hour(s))  MRSA PCR SCREENING     Status: None   Collection Time    11/20/12 11:53 PM  Result Value Range Status   MRSA by PCR NEGATIVE  NEGATIVE Final   Comment:            The GeneXpert MRSA Assay (FDA     approved for NASAL specimens     only), is one component of a     comprehensive MRSA colonization     surveillance program. It is not     intended to diagnose MRSA     infection nor to guide or     monitor treatment for     MRSA infections.  URINE CULTURE     Status: None   Collection Time    11/21/12 12:23 AM      Result Value Range Status   Specimen Description URINE, CATHETERIZED   Final   Special Requests Normal   Final   Culture  Setup Time     Final   Value: 11/21/2012 15:10     Performed at [facility] Count     Final   Value: NO GROWTH     Performed at [facility]   Culture     Final   Value: NO GROWTH     Performed at [facility]   Report Status 11/22/2012 FINAL   Final     Labs: Basic Metabolic Panel:  Recent Labs Lab 11/20/12 1852 11/20/12 1908 11/21/12 0336 11/22/12 0432 11/23/12 0500 11/24/12 0422  NA 128* 127* 131* 135 138 138  K 2.5* 2.7* 2.9* 3.3* 2.8* 3.2*  CL 76* 85* 90* 98 103 103  CO2 33*  --  28 26 26 25   GLUCOSE 117* 119* 92 77 82 102*  BUN 78* 74* 71* 50* 31* 17  CREATININE 6.07* 6.50* 5.24* 3.03* 1.82* 1.37*  CALCIUM 9.8  --  8.1* 8.4 8.8 8.9  MG 2.8*  --   2.3  --   --   --    Liver Function Tests:  Recent Labs Lab 11/20/12 1852  AST 14  ALT 11  ALKPHOS 98  BILITOT 0.3  PROT 7.4  ALBUMIN 3.4*   CBC:  Recent Labs Lab 11/20/12 1852 11/20/12 1908 11/21/12 0336 11/22/12 0432 11/23/12 0500 11/24/12 0422  WBC 8.0  --  5.9 6.3 5.5 6.1  NEUTROABS 5.6  --   --   --   --   --   HGB 13.4 15.6* 10.5* 10.1* 10.1* 10.0*  HCT 39.3 46.0 31.9* 30.8* 30.9* 30.3*  MCV 82.2  --  83.3 84.2 84.2 84.4  PLT 512*  --  427* 395 402* 390   CBG:  Recent Labs Lab 11/22/12 1211 11/22/12 1627 11/23/12 0701 11/23/12 1642 11/24/12 1207  GLUCAP 92 95 81 92 121*   SIGNED: Time coordinating discharge: Over 30 minutes  Debbora Presto, MD  [facility] 11/24/2012, 3:47 PM Pager [phone]  If 7PM-7AM, please contact night-coverage [email or web address] Password [email or web address]

## 2012-11-25 NOTE — Care Management Note (Signed)
    Page 1 of 1   11/25/2012     12:22:09 PM   CARE MANAGEMENT NOTE 11/25/2012  Patient:  Lisa Leblanc, Lisa Leblanc   Account Number:  [id number]  Date Initiated:  11/21/2012  Documentation initiated by:  [facility]  Subjective/Objective Assessment:   52 year old female admitted with dehydration and ARF.     Action/Plan:   From home.   Anticipated DC Date:  11/24/2012   Anticipated DC Plan:  HOME/SELF CARE      DC Planning Services  CM consult      Choice offered to / List presented to:             Status of service:  Completed, signed off Medicare Important Message given?  NA - LOS <3 / Initial given by admissions (If response is "NO", the following Medicare IM given date fields will be blank) Date Medicare IM given:   Date Additional Medicare IM given:    Discharge Disposition:  HOME/SELF CARE  Per UR Regulation:  Reviewed for med. necessity/level of care/duration of stay  If discussed at Long Length of Stay Meetings, dates discussed:    Comments:

## 2012-11-30 ENCOUNTER — Ambulatory Visit: Payer: BC Managed Care – PPO | Attending: Internal Medicine | Admitting: Internal Medicine

## 2012-11-30 ENCOUNTER — Encounter: Payer: Self-pay | Admitting: Internal Medicine

## 2012-11-30 VITALS — BP 168/115 | HR 93 | Temp 99.0°F | Ht 64.0 in | Wt 194.6 lb

## 2012-11-30 DIAGNOSIS — M7989 Other specified soft tissue disorders: Secondary | ICD-10-CM

## 2012-11-30 DIAGNOSIS — R112 Nausea with vomiting, unspecified: Secondary | ICD-10-CM | POA: Insufficient documentation

## 2012-11-30 DIAGNOSIS — R1011 Right upper quadrant pain: Secondary | ICD-10-CM | POA: Insufficient documentation

## 2012-11-30 DIAGNOSIS — E669 Obesity, unspecified: Secondary | ICD-10-CM | POA: Insufficient documentation

## 2012-11-30 LAB — COMPREHENSIVE METABOLIC PANEL
BUN: 9 mg/dL (ref 6–23)
CO2: 29 mEq/L (ref 19–32)
Calcium: 8.4 mg/dL (ref 8.4–10.5)
Creat: 0.98 mg/dL (ref 0.50–1.10)
Glucose, Bld: 74 mg/dL (ref 70–99)
Total Bilirubin: 0.5 mg/dL (ref 0.3–1.2)

## 2012-11-30 LAB — CBC
HCT: 30.4 % — ABNORMAL LOW (ref 36.0–46.0)
Hemoglobin: 10.3 g/dL — ABNORMAL LOW (ref 12.0–15.0)
MCH: 27.5 pg (ref 26.0–34.0)
MCV: 81.3 fL (ref 78.0–100.0)
RBC: 3.74 MIL/uL — ABNORMAL LOW (ref 3.87–5.11)

## 2012-11-30 LAB — LIPASE: Lipase: 38 U/L (ref 0–75)

## 2012-11-30 MED ORDER — HYDRALAZINE HCL 25 MG PO TABS: 25.0000 mg | ORAL_TABLET | Freq: Three times a day (TID) | ORAL | Status: DC

## 2012-11-30 MED ORDER — PROMETHAZINE HCL 25 MG PO TABS: 25.0000 mg | ORAL_TABLET | Freq: Four times a day (QID) | ORAL | Status: DC | PRN

## 2012-11-30 NOTE — Addendum Note (Signed)
Addended by: Eddie North on: 11/30/2012 05:07 PM   Modules accepted: Orders

## 2012-11-30 NOTE — Progress Notes (Signed)
Patient ID: Lisa Leblanc, female   DOB: August 24, 1960, 52 y.o.   MRN: [id number] PCP:  Dennison Mascot, MD   DOA:  No admission date for patient encounter.  Chief Complaint:  Bilateral leg swellings, nausea and vomiting and right upper quadrant pain for almost 2 weeks  HPI: 52 year old obese female who was admitted to the hospital recently for acute kidney injury suspected to be due to prerenal and azotemia  due to nephrotoxins presents with above-mentioned complaint ongoing since discharge from the hospital. She had creatinine level greater than 6 on admission which improved to 1.3 prior to discharge. Her ACE inhibitor , HCTZ and ARB were discontinued and discharged on low-dose hydralazine. He also had hyponatremia and hypokalemia which will be replenished. Patient reports increasing leg swellings since he was discharged from the hospital and also seated with ongoing nausea and vomiting of almost 3-4 times daily. He reports having poor appetite and also has some right upper quadrant pain for the last 7-10 days. Reports having some chills but denies any fever, headache, blurred vision, chest pain, palpitations, bowel or urinary symptoms. She does report dyspnea on exertion. She continues to smoke half a pack per day.  Allergies: Allergies  Allergen Reactions  . Ivp Dye [Iodinated Diagnostic Agents]     Prior to Admission medications   Medication Sig Start Date End Date Taking? Authorizing Provider  glipiZIDE (GLUCOTROL XL) 5 MG 24 hr tablet Take 5 mg by mouth daily.   Yes Historical Provider, MD  hydrALAZINE (APRESOLINE) 25 MG tablet Take 1 tablet (25 mg total) by mouth 3 (three) times daily. 11/30/12  Yes Alanie Syler, MD  insulin detemir (LEVEMIR) 100 UNIT/ML injection Inject 45 Units into the skin at bedtime.   Yes Historical Provider, MD  omeprazole (PRILOSEC) 20 MG capsule Take 20 mg by mouth daily.   Yes Historical Provider, MD  oxyCODONE (OXYCONTIN) 20 MG 12 hr tablet Take 60 mg by  mouth every 12 (twelve) hours. pain   Yes Historical Provider, MD  promethazine (PHENERGAN) 25 MG tablet Take 25 mg by mouth every 6 (six) hours as needed for nausea (nausea).   Yes Historical Provider, MD  senna-docusate (SENOKOT-S) 8.6-50 MG per tablet Take 1 tablet by mouth 2 (two) times daily. 11/24/12  Yes Dorothea Ogle, MD    Past Medical History  Diagnosis Date  . Obesity   . Hypertension   . Lumbosacral neuritis   . Chronic pain   . Rupture of rotator cuff, complete   . Diabetes mellitus   . Narcotic dependence     Past Surgical History  Procedure Laterality Date  . Left knee arthroscopy    . Cholecystectomy  2012    Social History:  reports that she has been smoking Cigarettes.  She has a 15 pack-year smoking history. She has never used smokeless tobacco. She reports that she does not drink alcohol or use illicit drugs.  Family History  Problem Relation Age of Onset  . Colon cancer Brother   . Diabetes type II Brother   . Diabetes Mother   . Hypertension Mother   . Diabetes Brother   . Colon cancer Sister   . Colon cancer Maternal Aunt   . Colon cancer Maternal Aunt   . Colon cancer Maternal Aunt     Review of Systems:  As outlined in history of present illness  Physical Exam:  Filed Vitals:   11/30/12 1636  BP: 168/115  Pulse: 93  Temp: 99 F (37.2 C)  TempSrc: Oral  Height: 5\' 4"  (1.626 m)  Weight: 194 lb 9.6 oz (88.27 kg)  SpO2: 100%    Constitutional: Vital signs reviewed.  Patient is a middle aged obese female appears fatigued HEENT: No pallor, moist oral mucosa, Cardiovascular: RRR, S1 normal, S2 normal, no MRG,  Pulmonary/Chest: CTAB, no wheezes, rales, or rhonchi Abdominal: Soft. Obese, bowel sounds present, tender to deep palpation over right upper quadrant and epigastric area GU: no CVA tenderness  extremities: 2+ pitting edema up within bilaterally CNS: AAO x3  Labs on Admission:  No results found for this or any previous visit (from  the past 48 hour(s)).  Radiological Exams on Admission: Dg Chest Portable 1 View  11/20/2012   *RADIOLOGY REPORT*  Clinical Data: Abdominal pain, nausea and vomiting.  PORTABLE CHEST - 1 VIEW  Comparison: 05/03/2009  Findings: The lungs are clear.  No edema, infiltrate or pleural fluid is seen.  Heart size is at the upper limits of normal.  No bony abnormalities are seen.  IMPRESSION: No active disease.   Original Report Authenticated By: Irish Lack, M.D.   Dg Abd 2 Views  11/20/2012   *RADIOLOGY REPORT*  Clinical Data: Nausea, vomiting.  ABDOMEN - 2 VIEW  Comparison: None.  Findings: No abnormal intra-abdominal mass effect or calcification.  Nonobstructive bowel gas pattern.  Stool present in the majority of the colonic segments. No pneumoperitoneum.  Previous right obturator ring fractures, involving the superior and inferior pubic rami.   Still evident fracture lucency through the superior pubic ramus; there is bulky associated callus.  Lung bases clear.  IMPRESSION:  1.  Nonobstructive bowel gas pattern. 2.  Large stool burden. 3.  Healing right obturator ring fractures.   Original Report Authenticated By: Tiburcio Pea    Assessment/Plan Bilateral leg edema Possibly has fluid overload with some increased dyspnea on exertion. sats normal on RA. Will check CMET, pro BNP. Given her recent acute kidney injury I would hold off on starting her on a day to take before checking repeat renal function.  RUQ pain with nausea and vomiting. She has hx of cholecystectomy. i will check for CMET and lipase level. i will prescribe her antiemetics. i will obtain and [place] of her abdomen as well.    uncontrolled hypertension I will increase her dose of hydralazine to 25 mg 3 times a day. depending upon her renal function she would need to be started on diuretic as well.  Diabetes mellitus Continue insulin and glipizide. A1c of 6.7  Tobacco abuse Counseled strongly on cessation.  I offered patient  will come to the ED or get admitted to the hospital and given ongoing abdominal pain with nausea and vomiting and significant leg swellings. He clearly refused to go to the ED or be admitted to the hospital. She needs to come back tomorrow morning to have her labs reviewed and further management.  Manette Doto 11/30/2012, 4:55 PM

## 2012-12-01 ENCOUNTER — Ambulatory Visit: Payer: BC Managed Care – PPO | Attending: Internal Medicine | Admitting: Internal Medicine

## 2012-12-01 VITALS — BP 146/100 | HR 112 | Temp 97.2°F | Resp 15 | Wt 194.0 lb

## 2012-12-01 DIAGNOSIS — E876 Hypokalemia: Secondary | ICD-10-CM | POA: Insufficient documentation

## 2012-12-01 DIAGNOSIS — R112 Nausea with vomiting, unspecified: Secondary | ICD-10-CM | POA: Insufficient documentation

## 2012-12-01 NOTE — Patient Instructions (Addendum)
Gastritis Gastritis is an irritation of the stomach. This is often caused by medications, but can be from anything that bothers the stomach. Other stomach irritants are:  Alcohol.   Caffeine.   Nicotine.   Spicy or acid foods.   Medications for pain and arthritis. Aspirin and other anti-inflammatory medicines such as ibuprofen (Advil), naproxen (Aleve), and ketoprofen (Orudis) can be highly irritating.   Emotional distress.  Symptoms of gastritis may include:  Abdominal pain.   Indigestion.   Nausea and or vomiting.   Bleeding.  Some patients with chronic gastritis and ulcers have been infected by a germ. They may need special testing. Medications which kill germs can be used to cure this condition. Treatment includes avoiding the substances mentioned above that are known to cause stomach trouble. Medications used to treat gastritis can include:  Antacids.   Medicines to control vomiting.   Acid blocking medicines.  Symptoms of gastritis usually improve within 2-3 days of starting treatment. Call your caregiver if you are not better in a few days. SEEK MEDICAL CARE IF:   You have increased stomach or chest pain.   You vomit blood.   You faint or feel lightheaded.   You cannot keep fluids down.   You pass bloody or black stools.   You develop severe back pain.  MAKE SURE YOU:   Understand these instructions.   Will watch your condition.   Will get help right away if you are not doing well or get worse.  Document Released: 03/10/2005 Document Revised: 02/27/2011 Document Reviewed: 08/26/2006 [facility] Patient Information 2012 [place], [place].

## 2012-12-01 NOTE — Progress Notes (Signed)
Patient is here for follow up from yesterday Needs lab results Still having swelling to lower legs SOB

## 2012-12-01 NOTE — Progress Notes (Signed)
Patient ID: Lisa Leblanc, female   DOB: 12-Jul-1960, 52 y.o.   MRN: [id number]    CC: follow up  HPI:  Pt is 52 yo female who comes in to discuss blood test result form yesterday. She reports persistent non bloody vomiting and says work up in the past has been non conclusive. She denies knowing about gastroparesis as an established diagnosis. She has no GI specialist who she follows with. She denies fevers, chills, any specific abdomina lor urinary concerns. Her problem is chronic in nature but has been worse over the past several months.   Allergies  Allergen Reactions  . Ivp Dye [Iodinated Diagnostic Agents]    Past Medical History  Diagnosis Date  . Obesity   . Hypertension   . Lumbosacral neuritis   . Chronic pain   . Rupture of rotator cuff, complete   . Diabetes mellitus   . Narcotic dependence    Current Outpatient Prescriptions on File Prior to Visit  Medication Sig Dispense Refill  . glipiZIDE (GLUCOTROL XL) 5 MG 24 hr tablet Take 5 mg by mouth daily.      . hydrALAZINE (APRESOLINE) 25 MG tablet Take 1 tablet (25 mg total) by mouth 3 (three) times daily.  90 tablet  1  . insulin detemir (LEVEMIR) 100 UNIT/ML injection Inject 45 Units into the skin at bedtime.      Marland Kitchen omeprazole (PRILOSEC) 20 MG capsule Take 20 mg by mouth daily.      Marland Kitchen oxyCODONE (OXYCONTIN) 20 MG 12 hr tablet Take 60 mg by mouth every 12 (twelve) hours. pain      . promethazine (PHENERGAN) 25 MG tablet Take 1 tablet (25 mg total) by mouth every 6 (six) hours as needed for nausea (nausea).  30 tablet  0  . senna-docusate (SENOKOT-S) 8.6-50 MG per tablet Take 1 tablet by mouth 2 (two) times daily.  60 tablet  1   No current facility-administered medications on file prior to visit.   Family History  Problem Relation Age of Onset  . Colon cancer Brother   . Diabetes type II Brother   . Diabetes Mother   . Hypertension Mother   . Diabetes Brother   . Colon cancer Sister   . Colon cancer Maternal Aunt    . Colon cancer Maternal Aunt   . Colon cancer Maternal Aunt    History   Social History  . Marital Status: Single    Spouse Name: N/A    Number of Children: 1  . Years of Education: N/A   Occupational History  . 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 Smoking status: Current Every Day Smoker -- 0.50 packs/day for 30 years    Types: Cigarettes  . Smokeless tobacco: Never Used     Comment: SMOKES LESS THAN 1 PK DAILY. Marland KitchenSMOKED 1 PK FOR 2--30 YRS   . Alcohol Use: No  . Drug Use: No  . Sexual Activity: Not on file   Other Topics Concern  . Not on file   Social History Narrative   Lives with her husband and her son.  Works at [employer] center.      Review of Systems  Constitutional: Negative for fever, chills, diaphoresis, activity change, appetite change and fatigue.  HENT: Negative for ear pain, nosebleeds, congestion, facial swelling, rhinorrhea, neck pain, neck stiffness and ear discharge.   Eyes: Negative for pain, discharge, redness, itching and visual disturbance.  Respiratory: Negative for cough, choking, chest tightness, shortness of breath, wheezing and stridor.   Cardiovascular: Negative for chest pain, palpitations and leg swelling.  Gastrointestinal: Negative for abdominal distention.  Genitourinary: Negative for dysuria, urgency, frequency, hematuria, flank pain, decreased urine volume, difficulty urinating and dyspareunia.  Musculoskeletal: Negative for back pain, joint swelling, arthralgias and gait problem.  Neurological: Negative for dizziness, tremors, seizures, syncope, facial asymmetry, speech difficulty, weakness, light-headedness, numbness and headaches.  Hematological: Negative for adenopathy. Does not bruise/bleed easily.  Psychiatric/Behavioral: Negative for hallucinations, behavioral problems, confusion, dysphoric mood, decreased concentration and agitation.    Objective:   Filed Vitals:   12/01/12 1414   BP: 146/100  Pulse: 112  Temp: 97.2 F (36.2 C)  Resp: 15    Physical Exam  Constitutional: Appears well-developed and well-nourished. No distress.  HENT: Normocephalic. External right and left ear normal. Oropharynx is clear and moist.  Eyes: Conjunctivae and EOM are normal. PERRLA, no scleral icterus.  Neck: Normal ROM. Neck supple. No JVD. No tracheal deviation. No thyromegaly.  CVS: RRR, S1/S2 +, no murmurs, no gallops, no carotid bruit.  Pulmonary: Effort and breath sounds normal, no stridor, rhonchi, wheezes, rales.  Abdominal: Soft. BS +,  no distension, tenderness, rebound or guarding.  Musculoskeletal: Normal range of motion. No edema and no tenderness.  Lymphadenopathy: No lymphadenopathy noted, cervical, inguinal. Neuro: Alert. Normal reflexes, muscle tone coordination. No cranial nerve deficit. Skin: Skin is warm and dry. No rash noted. Not diaphoretic. No erythema. No pallor.  Psychiatric: Normal mood and affect. Behavior, judgment, thought content normal.   Lab Results  Component Value Date   WBC 6.5 11/30/2012   HGB 10.3* 11/30/2012   HCT 30.4* 11/30/2012   MCV 81.3 11/30/2012   PLT 409* 11/30/2012   Lab Results  Component Value Date   CREATININE 0.98 11/30/2012   BUN 9 11/30/2012   NA 138 11/30/2012   K 3.7 11/30/2012   CL 103 11/30/2012   CO2 29 11/30/2012    Lab Results  Component Value Date   HGBA1C 6.7* 11/21/2012   Lipid Panel  No results found for this basename: chol, trig, hdl, cholhdl, vldl, ldlcalc       Assessment and plan:   Patient Active Problem List   Diagnosis Date Noted  . Hyponatremia - stable and resolved based on recent BMP 11/20/2012  . Hypokalemia - stable and within normal limits  11/20/2012  . Nausea and vomiting - most likely secondary to gastroparesis, persistent, pt wants to be referred to GI specialist, will place request. We have discussed avoiding use of NSAID'. Lipase is within normal limits. 11/20/2012

## 2012-12-06 ENCOUNTER — Telehealth: Payer: Self-pay | Admitting: Emergency Medicine

## 2012-12-06 ENCOUNTER — Other Ambulatory Visit: Payer: Self-pay | Admitting: Emergency Medicine

## 2012-12-06 MED ORDER — ONDANSETRON HCL 4 MG PO TABS: 4.0000 mg | ORAL_TABLET | Freq: Three times a day (TID) | ORAL | Status: DC | PRN

## 2012-12-06 NOTE — Telephone Encounter (Signed)
PT FRIEND EMMA CALLED IN CONCERNING PT CONTINUED N/V POST HOSPITALIZATION  DEHYDRATION WITH N/V 11/30/12.PT WAITING GI REFERRAL APPT. PT PRESCRIBED PHENERGAN BUT NOT WORKING. WILL SPEAK WITH DOCTOR, CALL [facility] IN [facility]

## 2012-12-07 ENCOUNTER — Encounter: Payer: Self-pay | Admitting: Gastroenterology

## 2012-12-07 ENCOUNTER — Ambulatory Visit (INDEPENDENT_AMBULATORY_CARE_PROVIDER_SITE_OTHER): Payer: BC Managed Care – PPO | Admitting: Gastroenterology

## 2012-12-07 VITALS — BP 124/70 | HR 112 | Ht 61.0 in | Wt 180.5 lb

## 2012-12-07 DIAGNOSIS — R112 Nausea with vomiting, unspecified: Secondary | ICD-10-CM

## 2012-12-07 DIAGNOSIS — R1115 Cyclical vomiting syndrome unrelated to migraine: Secondary | ICD-10-CM

## 2012-12-07 DIAGNOSIS — D649 Anemia, unspecified: Secondary | ICD-10-CM

## 2012-12-07 DIAGNOSIS — K5900 Constipation, unspecified: Secondary | ICD-10-CM

## 2012-12-07 MED ORDER — OMEPRAZOLE 20 MG PO CPDR: 20.0000 mg | DELAYED_RELEASE_CAPSULE | Freq: Two times a day (BID) | ORAL | Status: DC

## 2012-12-07 NOTE — Progress Notes (Signed)
History of Present Illness: This is a 52 year old female who was recently hospitalized for acute renal failure, dehydration nausea and vomiting. She also had significant constipation and obstipation. She relates a history of ulcers many years ago and states she had an endoscopy about 15 years ago. She notes frequent nausea and vomiting throughout the day for about one month and these symptoms are exacerbated by food. Prior to that she had no GI complaints except for constipation. She states she had a recent abdominal ultrasound performed in [place] was unremarkable. She has had a prior cholecystectomy. A normocytic anemia was noted during her hospitalization. She was placed on omeprazole 20 mg daily and her symptoms have improved however she still has significant nausea and occasional vomiting related to meals. Denies weight loss, abdominal pain, constipation, diarrhea, change in stool caliber, melena, hematochezia, dysphagia, reflux symptoms, chest pain.  Review of Systems: Pertinent positive and negative review of systems were noted in the above HPI section. All other review of systems were otherwise negative.  Current Medications, Allergies, Past Medical History, Past Surgical History, Family History and Social History were reviewed in [employer] record.  Physical Exam: General: Well developed , well nourished, no acute distress Head: Normocephalic and atraumatic Eyes:  sclerae anicteric, EOMI Ears: Normal auditory acuity Mouth: No deformity or lesions Neck: Supple, no masses or thyromegaly Lungs: Clear throughout to auscultation Heart: Regular rate and rhythm; no murmurs, rubs or bruits Abdomen: Soft, non tender and non distended. No masses, hepatosplenomegaly or hernias noted. Normal Bowel sounds Musculoskeletal: Symmetrical with no gross deformities  Skin: No lesions on visible extremities Pulses:  Normal pulses noted Extremities: No clubbing, cyanosis, edema or  deformities noted Neurological: Alert oriented x 4, grossly nonfocal Cervical Nodes:  No significant cervical adenopathy Inguinal Nodes: No significant inguinal adenopathy Psychological:  Alert and cooperative. Normal mood and affect  Assessment and Recommendations:  1. Nausea and vomiting. Etiology unclear. Rule out GERD, gastroparesis and ulcer disease. Avoid aspirin and NSAID products. Increase omeprazole to 20 mg by mouth twice a day. Attempt to obtain a copy of her recent ultrasound. Schedule upper endoscopy. The risks, benefits, and alternatives to endoscopy with possible biopsy and possible dilation were discussed with the patient and they consent to proceed.   2. Chronic constipation likely related to chronic narcotic usage. Continue Senokot. Plan to proceed with elective colonoscopy for colorectal cancer screening when her upper gastrointestinal complaints are under good control.  3. Normocytic anemia, felt to be on the basis of chronic disease per recent hospitalization.

## 2012-12-07 NOTE — Patient Instructions (Addendum)
You have been given a separate informational sheet regarding your tobacco use, the importance of quitting and local resources to help you quit.  You have been scheduled for an endoscopy with propofol. Please follow written instructions given to you at your visit today. If you use inhalers (even only as needed), please bring them with you on the day of your procedure. Your physician has requested that you go to www.startemmi.com and enter the access code given to you at your visit today. This web site gives a general overview about your procedure. However, you should still follow specific instructions given to you by our office regarding your preparation for the procedure.  We have sent the following medications to your pharmacy for you to pick up at your convenience: Omeprazole for you to increase to one tablet by mouth twice daily.  Patient advised to avoid spicy, acidic, citrus, chocolate, mints, fruit and fruit juices.  Limit the intake of caffeine, alcohol and Soda.  Don't exercise too soon after eating.  Don't lie down within 3-4 hours of eating.  Elevate the head of your bed.  Thank you for choosing me and [facility].  Venita Lick. Pleas Koch., MD., [facility]

## 2012-12-08 ENCOUNTER — Ambulatory Visit: Payer: BC Managed Care – PPO

## 2012-12-09 ENCOUNTER — Ambulatory Visit (AMBULATORY_SURGERY_CENTER): Payer: BC Managed Care – PPO | Admitting: Gastroenterology

## 2012-12-09 ENCOUNTER — Encounter: Payer: Self-pay | Admitting: Gastroenterology

## 2012-12-09 VITALS — BP 148/100 | HR 82 | Temp 98.6°F | Resp 12 | Ht 61.0 in | Wt 180.0 lb

## 2012-12-09 DIAGNOSIS — R1115 Cyclical vomiting syndrome unrelated to migraine: Secondary | ICD-10-CM

## 2012-12-09 MED ORDER — DEXTROSE 5 % IV SOLN: INTRAVENOUS | Status: DC

## 2012-12-09 NOTE — Patient Instructions (Addendum)

## 2012-12-09 NOTE — Progress Notes (Signed)
A/ox3 pleased with MAC, report to Jane RN 

## 2012-12-09 NOTE — Op Note (Addendum)
[facility] [address] [place], [address]   ENDOSCOPY PROCEDURE REPORT  PATIENT: Lisa, Leblanc  MR#: [id number] BIRTHDATE: 13-Apr-1960 , 51  yrs. old GENDER: Female ENDOSCOPIST: Meryl Dare, MD, [facility] REFERRED BY:  Dennison Mascot, M.D. PROCEDURE DATE:  12/09/2012 PROCEDURE:  EGD, diagnostic ASA CLASS:     Class II INDICATIONS:  Nausea.   Vomiting. MEDICATIONS: MAC sedation, administered by CRNA and propofol (Diprivan) 80mg  IV TOPICAL ANESTHETIC: DESCRIPTION OF PROCEDURE: After the risks benefits and alternatives of the procedure were thoroughly explained, informed consent was obtained.  The LB [id number] endoscope was introduced through the mouth and advanced to the second portion of the duodenum  without limitations.  The instrument was slowly withdrawn as the mucosa was fully examined.  ESOPHAGUS: The mucosa of the esophagus appeared normal. STOMACH: The mucosa and folds of the stomach appeared normal. DUODENUM: The duodenal mucosa showed no abnormalities in the bulb and second portion of the duodenum. Retroflexed views revealed no abnormalities.  The scope was then withdrawn from the patient and the procedure completed.  COMPLICATIONS: There were no complications.  ENDOSCOPIC IMPRESSION: 1.   The EGD appeared normal   RECOMMENDATIONS: 1.  Anti-reflux regimen 2.  Continue PPI bid and Zofran tid   eSigned:  Meryl Dare, MD, [facility] 12/09/2012 11:01 AM

## 2012-12-09 NOTE — Progress Notes (Signed)
Patient did not experience any of the following events: a burn prior to discharge; a fall within the facility; wrong site/side/patient/procedure/implant event; or a hospital transfer or hospital admission upon discharge from the facility. (G8907) Patient did not have preoperative order for IV antibiotic SSI prophylaxis. (G8918)  

## 2012-12-10 ENCOUNTER — Telehealth: Payer: Self-pay | Admitting: *Deleted

## 2012-12-10 NOTE — Telephone Encounter (Signed)
Left message that we called for f/u 

## 2012-12-15 ENCOUNTER — Encounter: Payer: Self-pay | Admitting: Gastroenterology

## 2012-12-27 ENCOUNTER — Emergency Department (HOSPITAL_COMMUNITY)
Admission: EM | Admit: 2012-12-27 | Discharge: 2012-12-27 | Discharge status: Against Medical Advice | Payer: BC Managed Care – PPO | Source: Home / Self Care

## 2012-12-27 ENCOUNTER — Emergency Department (HOSPITAL_COMMUNITY): Payer: BC Managed Care – PPO

## 2012-12-27 ENCOUNTER — Inpatient Hospital Stay (HOSPITAL_COMMUNITY)
Admission: EM | Admit: 2012-12-27 | Discharge: 2013-01-01 | DRG: 551 | Disposition: A | Discharge status: Home / Self Care | Payer: BC Managed Care – PPO | Attending: Internal Medicine | Admitting: Internal Medicine

## 2012-12-27 ENCOUNTER — Encounter (HOSPITAL_COMMUNITY): Payer: Self-pay | Admitting: *Deleted

## 2012-12-27 DIAGNOSIS — I10 Essential (primary) hypertension: Secondary | ICD-10-CM | POA: Insufficient documentation

## 2012-12-27 DIAGNOSIS — E43 Unspecified severe protein-calorie malnutrition: Secondary | ICD-10-CM | POA: Insufficient documentation

## 2012-12-27 DIAGNOSIS — A599 Trichomoniasis, unspecified: Secondary | ICD-10-CM | POA: Diagnosis present

## 2012-12-27 DIAGNOSIS — K219 Gastro-esophageal reflux disease without esophagitis: Secondary | ICD-10-CM | POA: Diagnosis present

## 2012-12-27 DIAGNOSIS — E876 Hypokalemia: Secondary | ICD-10-CM | POA: Diagnosis present

## 2012-12-27 DIAGNOSIS — E119 Type 2 diabetes mellitus without complications: Secondary | ICD-10-CM | POA: Diagnosis present

## 2012-12-27 DIAGNOSIS — R6889 Other general symptoms and signs: Secondary | ICD-10-CM | POA: Diagnosis present

## 2012-12-27 DIAGNOSIS — Z6829 Body mass index (BMI) 29.0-29.9, adult: Secondary | ICD-10-CM

## 2012-12-27 DIAGNOSIS — D649 Anemia, unspecified: Secondary | ICD-10-CM | POA: Diagnosis present

## 2012-12-27 DIAGNOSIS — R109 Unspecified abdominal pain: Secondary | ICD-10-CM

## 2012-12-27 DIAGNOSIS — R933 Abnormal findings on diagnostic imaging of other parts of digestive tract: Secondary | ICD-10-CM

## 2012-12-27 DIAGNOSIS — F1920 Other psychoactive substance dependence, uncomplicated: Secondary | ICD-10-CM | POA: Diagnosis present

## 2012-12-27 DIAGNOSIS — K5909 Other constipation: Secondary | ICD-10-CM | POA: Diagnosis present

## 2012-12-27 DIAGNOSIS — K859 Acute pancreatitis without necrosis or infection, unspecified: Secondary | ICD-10-CM | POA: Diagnosis present

## 2012-12-27 DIAGNOSIS — Z794 Long term (current) use of insulin: Secondary | ICD-10-CM | POA: Insufficient documentation

## 2012-12-27 DIAGNOSIS — R1115 Cyclical vomiting syndrome unrelated to migraine: Secondary | ICD-10-CM | POA: Diagnosis present

## 2012-12-27 DIAGNOSIS — F17200 Nicotine dependence, unspecified, uncomplicated: Secondary | ICD-10-CM | POA: Diagnosis present

## 2012-12-27 DIAGNOSIS — Z79899 Other long term (current) drug therapy: Secondary | ICD-10-CM

## 2012-12-27 DIAGNOSIS — E669 Obesity, unspecified: Secondary | ICD-10-CM | POA: Diagnosis present

## 2012-12-27 DIAGNOSIS — IMO0002 Reserved for concepts with insufficient information to code with codable children: Secondary | ICD-10-CM | POA: Diagnosis present

## 2012-12-27 DIAGNOSIS — Z9089 Acquired absence of other organs: Secondary | ICD-10-CM

## 2012-12-27 DIAGNOSIS — T4275XA Adverse effect of unspecified antiepileptic and sedative-hypnotic drugs, initial encounter: Secondary | ICD-10-CM | POA: Diagnosis present

## 2012-12-27 DIAGNOSIS — G8929 Other chronic pain: Secondary | ICD-10-CM | POA: Diagnosis present

## 2012-12-27 DIAGNOSIS — E871 Hypo-osmolality and hyponatremia: Secondary | ICD-10-CM | POA: Diagnosis present

## 2012-12-27 DIAGNOSIS — K5900 Constipation, unspecified: Secondary | ICD-10-CM | POA: Diagnosis present

## 2012-12-27 DIAGNOSIS — F1120 Opioid dependence, uncomplicated: Secondary | ICD-10-CM

## 2012-12-27 DIAGNOSIS — R1110 Vomiting, unspecified: Secondary | ICD-10-CM | POA: Insufficient documentation

## 2012-12-27 DIAGNOSIS — K5289 Other specified noninfective gastroenteritis and colitis: Principal | ICD-10-CM | POA: Diagnosis present

## 2012-12-27 DIAGNOSIS — R112 Nausea with vomiting, unspecified: Secondary | ICD-10-CM

## 2012-12-27 DIAGNOSIS — M5417 Radiculopathy, lumbosacral region: Secondary | ICD-10-CM | POA: Diagnosis present

## 2012-12-27 LAB — COMPREHENSIVE METABOLIC PANEL
AST: 48 U/L — ABNORMAL HIGH (ref 0–37)
Albumin: 2.8 g/dL — ABNORMAL LOW (ref 3.5–5.2)
Alkaline Phosphatase: 91 U/L (ref 39–117)
BUN: 8 mg/dL (ref 6–23)
BUN: 8 mg/dL (ref 6–23)
Calcium: 8.7 mg/dL (ref 8.4–10.5)
Calcium: 8.7 mg/dL (ref 8.4–10.5)
Chloride: 96 mEq/L (ref 96–112)
Creatinine, Ser: 0.75 mg/dL (ref 0.50–1.10)
GFR calc Af Amer: >90 mL/min (ref >90)
GFR calc non Af Amer: >90 mL/min (ref >90)
Glucose, Bld: 95 mg/dL (ref 70–99)
Glucose, Bld: 87 mg/dL (ref 70–99)
Potassium: 2.8 mEq/L — ABNORMAL LOW (ref 3.5–5.1)
Sodium: 135 mEq/L (ref 135–145)
Total Bilirubin: 0.5 mg/dL (ref 0.3–1.2)
Total Protein: 6.6 g/dL (ref 6.0–8.3)

## 2012-12-27 LAB — CBC WITH DIFFERENTIAL/PLATELET
Basophils Absolute: 0 10*3/uL (ref 0.0–0.1)
Basophils Relative: 0 % (ref 0–1)
Basophils Relative: 0 % (ref 0–1)
Eosinophils Absolute: 0 10*3/uL (ref 0.0–0.7)
Eosinophils Absolute: 0 10*3/uL (ref 0.0–0.7)
Eosinophils Relative: 0 % (ref 0–5)
HCT: 32.3 % — ABNORMAL LOW (ref 36.0–46.0)
Hemoglobin: 10.9 g/dL — ABNORMAL LOW (ref 12.0–15.0)
Hemoglobin: 11 g/dL — ABNORMAL LOW (ref 12.0–15.0)
Lymphs Abs: 1.2 10*3/uL (ref 0.7–4.0)
Lymphs Abs: 1.4 10*3/uL (ref 0.7–4.0)
MCH: 27.7 pg (ref 26.0–34.0)
MCH: 27.8 pg (ref 26.0–34.0)
MCV: 81.6 fL (ref 78.0–100.0)
Monocytes Absolute: 0.6 10*3/uL (ref 0.1–1.0)
Monocytes Relative: 12 % (ref 3–12)
Monocytes Relative: 12 % (ref 3–12)
Neutro Abs: 3.1 10*3/uL (ref 1.7–7.7)
Neutrophils Relative %: 63 % (ref 43–77)
Neutrophils Relative %: 63 % (ref 43–77)
Platelets: 426 10*3/uL — ABNORMAL HIGH (ref 150–400)
RBC: 3.94 MIL/uL (ref 3.87–5.11)
RBC: 3.96 MIL/uL (ref 3.87–5.11)
RDW: 19 % — ABNORMAL HIGH (ref 11.5–15.5)

## 2012-12-27 LAB — URINALYSIS, ROUTINE W REFLEX MICROSCOPIC
Hgb urine dipstick: NEGATIVE
Nitrite: NEGATIVE
Protein, ur: 30 mg/dL — AB
Specific Gravity, Urine: 1.027 (ref 1.005–1.030)
Urobilinogen, UA: 2 mg/dL — ABNORMAL HIGH (ref 0.0–1.0)
pH: 7 (ref 5.0–8.0)

## 2012-12-27 LAB — URINE MICROSCOPIC-ADD ON

## 2012-12-27 LAB — RAPID URINE DRUG SCREEN, HOSP PERFORMED
Amphetamines: NOT DETECTED
Barbiturates: NOT DETECTED
Benzodiazepines: NOT DETECTED
Cocaine: NOT DETECTED

## 2012-12-27 LAB — LIPASE, BLOOD
Lipase: 68 U/L — ABNORMAL HIGH (ref 11–59)
Lipase: 68 U/L — ABNORMAL HIGH (ref 11–59)

## 2012-12-27 LAB — GLUCOSE, CAPILLARY: Glucose-Capillary: 94 mg/dL (ref 70–99)

## 2012-12-27 MED ORDER — HYDRALAZINE HCL 25 MG PO TABS
25.0000 mg | ORAL_TABLET | Freq: Three times a day (TID) | ORAL | Status: DC
  Administered 2012-12-28 – 2013-01-01 (×14): 25 mg via ORAL
  Filled 2012-12-27 (×16): qty 1

## 2012-12-27 MED ORDER — SODIUM CHLORIDE 0.9 % IV SOLN: INTRAVENOUS | Status: DC

## 2012-12-27 MED ORDER — ONDANSETRON HCL 4 MG/2ML IJ SOLN
4.0000 mg | Freq: Once | INTRAMUSCULAR | Status: AC
  Administered 2012-12-27: 4 mg via INTRAVENOUS
  Filled 2012-12-27: qty 2

## 2012-12-27 MED ORDER — INSULIN ASPART 100 UNIT/ML ~~LOC~~ SOLN
0.0000 [IU] | Freq: Three times a day (TID) | SUBCUTANEOUS | Status: DC
  Administered 2012-12-28: 2 [IU] via SUBCUTANEOUS
  Administered 2013-01-01: 3 [IU] via SUBCUTANEOUS

## 2012-12-27 MED ORDER — LABETALOL HCL 5 MG/ML IV SOLN
5.0000 mg | INTRAVENOUS | Status: DC | PRN
  Administered 2012-12-28 (×2): 5 mg via INTRAVENOUS
  Filled 2012-12-27: qty 4

## 2012-12-27 MED ORDER — HYDROMORPHONE HCL PF 1 MG/ML IJ SOLN
1.0000 mg | Freq: Once | INTRAMUSCULAR | Status: AC
  Administered 2012-12-27: 1 mg via INTRAVENOUS
  Filled 2012-12-27: qty 1

## 2012-12-27 MED ORDER — PANTOPRAZOLE SODIUM 40 MG PO TBEC
40.0000 mg | DELAYED_RELEASE_TABLET | Freq: Every day | ORAL | Status: DC
  Administered 2012-12-28 – 2013-01-01 (×5): 40 mg via ORAL
  Filled 2012-12-27 (×5): qty 1

## 2012-12-27 MED ORDER — SORBITOL 70 % SOLN
30.0000 mL | Status: AC
  Administered 2012-12-28: 30 mL via ORAL
  Filled 2012-12-27: qty 30

## 2012-12-27 MED ORDER — OXYCODONE HCL ER 20 MG PO T12A
60.0000 mg | EXTENDED_RELEASE_TABLET | Freq: Two times a day (BID) | ORAL | Status: DC
  Administered 2012-12-28 – 2013-01-01 (×10): 60 mg via ORAL
  Filled 2012-12-27 (×11): qty 3

## 2012-12-27 MED ORDER — POLYETHYLENE GLYCOL 3350 17 G PO PACK
17.0000 g | PACK | Freq: Every day | ORAL | Status: DC
  Filled 2012-12-27: qty 1

## 2012-12-27 MED ORDER — ONDANSETRON HCL 4 MG/2ML IJ SOLN
4.0000 mg | Freq: Three times a day (TID) | INTRAMUSCULAR | Status: AC | PRN
  Administered 2012-12-28: 4 mg via INTRAVENOUS
  Filled 2012-12-27: qty 2

## 2012-12-27 MED ORDER — SORBITOL 70 % SOLN
960.0000 mL | TOPICAL_OIL | ORAL | Status: AC
  Administered 2012-12-28: 960 mL via RECTAL
  Filled 2012-12-27: qty 240

## 2012-12-27 MED ORDER — POTASSIUM CHLORIDE 10 MEQ/100ML IV SOLN
10.0000 meq | Freq: Once | INTRAVENOUS | Status: AC
  Administered 2012-12-27: 10 meq via INTRAVENOUS
  Filled 2012-12-27: qty 100

## 2012-12-27 MED ORDER — ACETAMINOPHEN 325 MG PO TABS
650.0000 mg | ORAL_TABLET | Freq: Four times a day (QID) | ORAL | Status: DC | PRN
  Administered 2012-12-29 – 2013-01-01 (×2): 650 mg via ORAL
  Filled 2012-12-27 (×3): qty 2

## 2012-12-27 MED ORDER — LUBIPROSTONE 24 MCG PO CAPS
24.0000 ug | ORAL_CAPSULE | Freq: Two times a day (BID) | ORAL | Status: DC
  Administered 2012-12-28 – 2013-01-01 (×10): 24 ug via ORAL
  Filled 2012-12-27 (×13): qty 1

## 2012-12-27 MED ORDER — INSULIN DETEMIR 100 UNIT/ML ~~LOC~~ SOLN
20.0000 [IU] | Freq: Every day | SUBCUTANEOUS | Status: DC
  Administered 2012-12-28 – 2012-12-29 (×2): 20 [IU] via SUBCUTANEOUS
  Filled 2012-12-27 (×3): qty 0.2

## 2012-12-27 MED ORDER — INSULIN ASPART 100 UNIT/ML ~~LOC~~ SOLN: 0.0000 [IU] | Freq: Every day | SUBCUTANEOUS | Status: DC

## 2012-12-27 MED ORDER — DOCUSATE SODIUM 100 MG PO CAPS
100.0000 mg | ORAL_CAPSULE | Freq: Two times a day (BID) | ORAL | Status: DC
  Administered 2012-12-28 – 2013-01-01 (×10): 100 mg via ORAL
  Filled 2012-12-27 (×11): qty 1

## 2012-12-27 MED ORDER — SODIUM CHLORIDE 0.9 % IV BOLUS (SEPSIS)
1000.0000 mL | Freq: Once | INTRAVENOUS | Status: AC
  Administered 2012-12-27: 1000 mL via INTRAVENOUS

## 2012-12-27 MED ORDER — POTASSIUM CHLORIDE 20 MEQ/15ML (10%) PO LIQD
40.0000 meq | Freq: Once | ORAL | Status: AC
  Administered 2012-12-27: 40 meq via ORAL
  Filled 2012-12-27: qty 30

## 2012-12-27 MED ORDER — SODIUM CHLORIDE 0.9 % IV SOLN
INTRAVENOUS | Status: DC
  Administered 2012-12-28: 14:00:00 via INTRAVENOUS
  Administered 2012-12-28: 1000 mL via INTRAVENOUS
  Administered 2012-12-29 – 2012-12-31 (×3): via INTRAVENOUS

## 2012-12-27 MED ORDER — HEPARIN SODIUM (PORCINE) 5000 UNIT/ML IJ SOLN
5000.0000 [IU] | Freq: Three times a day (TID) | INTRAMUSCULAR | Status: DC
  Administered 2012-12-28 – 2013-01-01 (×13): 5000 [IU] via SUBCUTANEOUS
  Filled 2012-12-27 (×17): qty 1

## 2012-12-27 MED ORDER — ACETAMINOPHEN 650 MG RE SUPP: 650.0000 mg | Freq: Four times a day (QID) | RECTAL | Status: DC | PRN

## 2012-12-27 NOTE — ED Notes (Signed)
Pt states that she was recently discharged from hospital for emesis and dehydration. States after that she felt better but it came back. In the last two weeks she has been vomiting and can't keep any food down.Denies diarrhea. Denies CP/SOB.

## 2012-12-27 NOTE — ED Notes (Signed)
Pt states that she has been vomiting for 6 days or more and was recently admitted for dehydration at [facility].  No abdominal pain or chest pain.

## 2012-12-27 NOTE — H&P (Addendum)
[facility] History and Physical  Lisa Leblanc ZOX:[id number] DOB: Aug 09, 1960 DOA: 12/27/2012  Referring physician: Emergency Department PCP: Dennison Mascot, MD  Specialists:   Chief Complaint: N/V, decreased PO intake  HPI: Lisa Leblanc is a 52 y.o. female  With a hx of narcotic dependence who presents to the ED with decreased PO intake associated with n/v. Was recently admitted 1 mos ago for ARF associated with dehydration and constipation. In the ED, pt was unable to tolerate PO trial at the bedside. Hospitalist was consulted for admission. On further questioning, pt reports no BM x 1 week.  Review of Systems:  Per above, remainder of 10pt ros reviewed and are neg  Past Medical History  Diagnosis Date  . Obesity   . Hypertension   . Lumbosacral neuritis   . Chronic pain   . Diabetes mellitus   . Narcotic dependence    Past Surgical History  Procedure Laterality Date  . Anterior cruciate ligament repair Left   . Cholecystectomy  2012   Social History:  reports that she has been smoking Cigarettes.  She has a 15 pack-year smoking history. She has never used smokeless tobacco. She reports that she does not drink alcohol or use illicit drugs.  where does patient live--home, ALF, SNF? and with whom if at home?  Can patient participate in ADLs?  Allergies  Allergen Reactions  . Ivp Dye [Iodinated Diagnostic Agents]     Family History  Problem Relation Age of Onset  . Diabetes type II Brother   . Diabetes Mother   . Hypertension Mother   . Diabetes Brother   . Colon cancer Sister   . Colon cancer Maternal Aunt     x 3    (be sure to complete)  Prior to Admission medications   Medication Sig Start Date End Date Taking? Authorizing Provider  glipiZIDE (GLUCOTROL XL) 5 MG 24 hr tablet Take 5 mg by mouth daily.   Yes Historical Provider, MD  hydrALAZINE (APRESOLINE) 25 MG tablet Take 25 mg by mouth 3 (three) times daily. 11/30/12  Yes Nishant Dhungel, MD   insulin detemir (LEVEMIR) 100 UNIT/ML injection Inject 45 Units into the skin at bedtime.   Yes Historical Provider, MD  omeprazole (PRILOSEC) 20 MG capsule Take 1 capsule (20 mg total) by mouth 2 (two) times daily. 12/07/12  Yes Meryl Dare, MD  oxyCODONE (OXYCONTIN) 20 MG 12 hr tablet Take 60 mg by mouth every 12 (twelve) hours. pain   Yes Historical Provider, MD  senna-docusate (SENOKOT-S) 8.6-50 MG per tablet Take 1 tablet by mouth 2 (two) times daily. 11/24/12  Yes Dorothea Ogle, MD  ondansetron (ZOFRAN) 4 MG tablet Take 1 tablet (4 mg total) by mouth every 8 (eight) hours as needed for nausea. 12/06/12   Dorothea Ogle, MD   Physical Exam: Filed Vitals:   12/27/12 1847  BP: 148/107  Pulse: 104  Temp: 98.8 F (37.1 C)  TempSrc: Oral  Resp: 20  SpO2: 100%     General:  Awake, in nad  Eyes: PERRL B  ENT: membranes dry, dentition fair  Neck: trachea midline, neck supple  Cardiovascular: regular, s1, s2  Respiratory: normal resp effort, no wheezing  Abdomen: obese, decreased BS  Skin: no abnormal skin lesions seen, normal skin turgor  Musculoskeletal: perfused, no clubbing or cyanosis  Psychiatric: mood/affect normal // no auditory/visual hallucinations  Neurologic: cn2-12 grossly intact, strength/sensation intact  Labs on Admission:  Basic Metabolic Panel:  Recent Labs Lab  12/27/12 1541 12/27/12 1940  NA 135 134*  K 2.8* 2.9*  CL 96 95*  CO2 24 24  GLUCOSE 95 87  BUN 8 8  CREATININE 0.77 0.75  CALCIUM 8.7 8.7   Liver Function Tests:  Recent Labs Lab 12/27/12 1541 12/27/12 1940  AST 48* 43*  ALT 17 17  ALKPHOS 91 86  BILITOT 0.5 0.5  PROT 6.9 6.6  ALBUMIN 2.8* 2.6*    Recent Labs Lab 12/27/12 1541 12/27/12 1940  LIPASE 68* 68*   No results found for this basename: AMMONIA,  in the last 168 hours CBC:  Recent Labs Lab 12/27/12 1541 12/27/12 1940  WBC 4.9 5.4  NEUTROABS 3.1 3.4  HGB 10.9* 11.0*  HCT 32.1* 32.3*  MCV 81.5 81.6   PLT 416* 426*   Cardiac Enzymes: No results found for this basename: CKTOTAL, CKMB, CKMBINDEX, TROPONINI,  in the last 168 hours  BNP (last 3 results) No results found for this basename: PROBNP,  in the last 8760 hours CBG: No results found for this basename: GLUCAP,  in the last 168 hours  Radiological Exams on Admission: No results found.  Assessment/Plan Principal Problem:   Constipation Active Problems:   Obesity   Hypertension   Lumbosacral neuritis   Diabetes mellitus   Narcotic dependence   Hypokalemia   Nausea and vomiting   1. Likely symptomatic Constipation 1. Last abd xray from 8/14 reviewed which showed a large stool burden 2. Pt reports last BM was one week ago 3. Currently with limited PO intake at bedside 4. Admit to obs 5. KUB pending 6. Start on aggressive cathartics to promote bowel motility - consider sorbitol PO with SMOG enema 7. Consider daily BID amitiza in setting of constipation related to narcotic use 2. Narcotic dependence 1. Advised pt to avoid over-medication w/ narcotics 2. Amitiza per above with stool softener 3. DM 1. Random BS of 87 currently 2. Will hold long-acting insulin for tonight and resume levemir tomorrow at 1/2 the home dose given pt's decreased PO intake 3. Cont with SSI coverage 4. Hypokalemia 1. Likely secondary to poor PO intake 2. Replaced in the ED 3. Will follow renal panel and replace lytes as needed 5. DVT prophylaxis 1. Heparin subQ  Code Status: Full (must indicate code status--if unknown or must be presumed, indicate so) Family Communication: Pt in room (indicate person spoken with, if applicable, with phone number if by telephone) Disposition Plan: Pending (indicate anticipated LOS)  Time spent:  CHIU, STEPHEN K [facility] Pager [phone]  If 7PM-7AM, please contact night-coverage [email or web address] Password [email or web address] 12/27/2012, 10:56 PM

## 2012-12-27 NOTE — ED Provider Notes (Signed)
CSN: [id number]     Arrival date & time 12/27/12  1811 History   First MD Initiated Contact with Patient 12/27/12 2009     Chief Complaint  Patient presents with  . Emesis  . Abdominal Pain   (Consider location/radiation/quality/duration/timing/severity/associated sxs/prior Treatment) The history is provided by the patient, the spouse and medical records. No language interpreter was used.    Lisa Leblanc is a 52 y.o. female  with a hx of IDDM (only when CBG is >150), HTN, chronic back pain, gastroparesis presents to the Emergency Department complaining of gradual, persistent, progressively worsening nausea and vomiting beginning 2 weeks ago.  Pt reports associated decreased appetite, chills, fatigue, generalized weakness and skin breakdown.  She reports Pt reports her symptoms are unchanged with food intake.  Pt reports chronic narcotic usage due to her chronic back pain which causes constipation, but this is unchanged.  Pt reports relief from the nausea for several hours after emesis, but the symptoms always return.  Pt reports ice also makes her feel better.  She denies abdominal pain, fever, headache, chest pain, SOB, diarrhea, dysuria, hematuria.  Patient reports she's been taking her PPI. She also reports that her blood sugar has been so low because of her lack of by mouth intake that she has not been taking her insulin or Glucotrol.   Past Medical History  Diagnosis Date  . Obesity   . Hypertension   . Lumbosacral neuritis   . Chronic pain   . Diabetes mellitus   . Narcotic dependence    Past Surgical History  Procedure Laterality Date  . Anterior cruciate ligament repair Left   . Cholecystectomy  2012   Family History  Problem Relation Age of Onset  . Diabetes type II Brother   . Diabetes Mother   . Hypertension Mother   . Diabetes Brother   . Colon cancer Sister   . Colon cancer Maternal Aunt     x 3   History  Substance Use Topics  . Smoking status: Current  Every Day Smoker -- 0.50 packs/day for 30 years    Types: Cigarettes  . Smokeless tobacco: Never Used     Comment: SMOKES LESS THAN 1 PK DAILY. Marland KitchenSMOKED 1 PK FOR 2--30 YRS   . Alcohol Use: No   OB History   Grav Para Term Preterm Abortions TAB SAB Ect Mult Living                 Review of Systems  Constitutional: Negative for fever, diaphoresis, appetite change, fatigue and unexpected weight change.  HENT: Negative for mouth sores, trouble swallowing, neck pain and neck stiffness.   Respiratory: Negative for cough, chest tightness, shortness of breath, wheezing and stridor.   Cardiovascular: Negative for chest pain and palpitations.  Gastrointestinal: Positive for nausea and vomiting. Negative for abdominal pain, diarrhea, constipation, blood in stool, abdominal distention and rectal pain.  Genitourinary: Negative for dysuria, urgency, frequency, hematuria, flank pain and difficulty urinating.  Musculoskeletal: Positive for back pain (chronic).  Skin: Negative for rash.  Neurological: Negative for weakness.  Hematological: Negative for adenopathy.  Psychiatric/Behavioral: Negative for confusion.  All other systems reviewed and are negative.    Allergies  Ivp dye  Home Medications   Current Outpatient Rx  Name  Route  Sig  Dispense  Refill  . glipiZIDE (GLUCOTROL XL) 5 MG 24 hr tablet   Oral   Take 5 mg by mouth daily.         Marland Kitchen  hydrALAZINE (APRESOLINE) 25 MG tablet   Oral   Take 25 mg by mouth 3 (three) times daily.         . insulin detemir (LEVEMIR) 100 UNIT/ML injection   Subcutaneous   Inject 45 Units into the skin at bedtime.         Marland Kitchen omeprazole (PRILOSEC) 20 MG capsule   Oral   Take 1 capsule (20 mg total) by mouth 2 (two) times daily.   60 capsule   11   . oxyCODONE (OXYCONTIN) 20 MG 12 hr tablet   Oral   Take 60 mg by mouth every 12 (twelve) hours. pain         . senna-docusate (SENOKOT-S) 8.6-50 MG per tablet   Oral   Take 1 tablet by mouth 2  (two) times daily.   60 tablet   1   . ondansetron (ZOFRAN) 4 MG tablet   Oral   Take 1 tablet (4 mg total) by mouth every 8 (eight) hours as needed for nausea.   20 tablet   0    BP 148/107  Pulse 104  Temp(Src) 98.8 F (37.1 C) (Oral)  Resp 20  SpO2 100% Physical Exam  Nursing note and vitals reviewed. Constitutional: She is oriented to person, place, and time. She appears well-developed and well-nourished.  HENT:  Head: Normocephalic and atraumatic.  Mouth/Throat: Oropharynx is clear and moist.  Eyes: Conjunctivae are normal. Pupils are equal, round, and reactive to light. No scleral icterus.  Neck: Normal range of motion.  Cardiovascular: Regular rhythm, normal heart sounds and intact distal pulses.   Tachycardic  Pulmonary/Chest: Effort normal and breath sounds normal. No respiratory distress. She has no wheezes. She has no rales. She exhibits no tenderness.  Abdominal: Soft. Normal appearance and bowel sounds are normal. She exhibits no distension and no mass. There is tenderness in the epigastric area. There is no rebound and no guarding.  Very mild epigastric soreness to palpation No guarding or rebound No peritoneal signs  Musculoskeletal: Normal range of motion. She exhibits no tenderness.  Lymphadenopathy:    She has no cervical adenopathy.  Neurological: She is alert and oriented to person, place, and time. She exhibits normal muscle tone. Coordination normal.  Skin: Skin is warm and dry. Rash noted. There is erythema.  Erythema and scaling of the face and arms  Psychiatric: She has a normal mood and affect. Her behavior is normal.    ED Course  Procedures (including critical care time) Labs Review Labs Reviewed  CBC WITH DIFFERENTIAL - Abnormal; Notable for the following:    Hemoglobin 11.0 (*)    HCT 32.3 (*)    RDW 18.7 (*)    Platelets 426 (*)    All other components within normal limits  COMPREHENSIVE METABOLIC PANEL - Abnormal; Notable for the  following:    Sodium 134 (*)    Potassium 2.9 (*)    Chloride 95 (*)    Albumin 2.6 (*)    AST 43 (*)    All other components within normal limits  LIPASE, BLOOD - Abnormal; Notable for the following:    Lipase 68 (*)    All other components within normal limits  URINALYSIS, ROUTINE W REFLEX MICROSCOPIC  URINE RAPID DRUG SCREEN (HOSP PERFORMED)   Imaging Review No results found.  ECG:  Date: 12/27/2012  Rate: 102  Rhythm: sinus tachycardia  QRS Axis: left  Intervals: normal  ST/T Wave abnormalities: nonspecific ST changes  Conduction Disutrbances:none  Narrative  Interpretation: nonischemic ECG, unchanged from 11/20/12  Old EKG Reviewed: unchanged    MDM   1. Abdominal pain   2. Nausea and vomiting   3. Constipation   4. Hypokalemia   5. Diabetes mellitus   6. Narcotic dependence   7. Hyponatremia      Lisa Leblanc presents with persistent nausea and vomiting for 2 weeks.  Record review shows that patient saw Meryl Dare, MD of gastroenterology in mid-September. She had an endoscopy on 12/09/12 was found to be completely normal. Patient was diagnosed with GERD and discharged home on a PPI.  Patient reports she's been taking this medication.  Patient afebrile and tachycardic here in the emergency department.  She does not have a leukocytosis and has a mild anemia at 11. CMP with electrolyte disturbances including hypokalemia, hyponatremia and hypochloremia. Patient with mildly elevated lipase at 68.  Acute abd, UA and UDS pending.  Will proceed with admission.    UDS positive for opiates and marijuana.  Patient with large number of ketones in her urine, likely from dehydration.  Patient also has Trichomonas in her urine.  Acute abdomen with nonobstructed gas pattern.    Dahlia Client Yevette Knust, PA-C 12/28/12 0111

## 2012-12-27 NOTE — ED Notes (Signed)
Patient states that she is unable to give urine sample at this time. 

## 2012-12-28 ENCOUNTER — Encounter (HOSPITAL_COMMUNITY): Payer: Self-pay

## 2012-12-28 DIAGNOSIS — K859 Acute pancreatitis without necrosis or infection, unspecified: Secondary | ICD-10-CM | POA: Diagnosis present

## 2012-12-28 LAB — COMPREHENSIVE METABOLIC PANEL
ALT: 18 U/L (ref 0–35)
Albumin: 2.5 g/dL — ABNORMAL LOW (ref 3.5–5.2)
BUN: 7 mg/dL (ref 6–23)
Calcium: 8.5 mg/dL (ref 8.4–10.5)
Creatinine, Ser: 0.69 mg/dL (ref 0.50–1.10)
GFR calc Af Amer: >90 mL/min (ref >90)
Glucose, Bld: 105 mg/dL — ABNORMAL HIGH (ref 70–99)
Sodium: 134 mEq/L — ABNORMAL LOW (ref 135–145)
Total Protein: 6.4 g/dL (ref 6.0–8.3)

## 2012-12-28 LAB — GLUCOSE, CAPILLARY
Glucose-Capillary: 111 mg/dL — ABNORMAL HIGH (ref 70–99)
Glucose-Capillary: 140 mg/dL — ABNORMAL HIGH (ref 70–99)
Glucose-Capillary: 105 mg/dL — ABNORMAL HIGH (ref 70–99)
Glucose-Capillary: 131 mg/dL — ABNORMAL HIGH (ref 70–99)

## 2012-12-28 LAB — CBC
HCT: 33.1 % — ABNORMAL LOW (ref 36.0–46.0)
Hemoglobin: 11.1 g/dL — ABNORMAL LOW (ref 12.0–15.0)
RBC: 4.06 MIL/uL (ref 3.87–5.11)
WBC: 7.4 10*3/uL (ref 4.0–10.5)

## 2012-12-28 MED ORDER — LABETALOL HCL 5 MG/ML IV SOLN: 5.0000 mg | INTRAVENOUS | Status: DC | PRN

## 2012-12-28 MED ORDER — LABETALOL HCL 300 MG PO TABS
300.0000 mg | ORAL_TABLET | Freq: Three times a day (TID) | ORAL | Status: DC
  Administered 2012-12-28 – 2013-01-01 (×11): 300 mg via ORAL
  Filled 2012-12-28 (×14): qty 1

## 2012-12-28 MED ORDER — POLYETHYLENE GLYCOL 3350 17 G PO PACK
17.0000 g | PACK | Freq: Two times a day (BID) | ORAL | Status: DC
  Administered 2012-12-28 – 2012-12-31 (×3): 17 g via ORAL
  Filled 2012-12-28 (×9): qty 1

## 2012-12-28 MED ORDER — HYDROCODONE-ACETAMINOPHEN 7.5-325 MG/15ML PO SOLN
10.0000 mL | Freq: Four times a day (QID) | ORAL | Status: DC | PRN
  Administered 2012-12-28 – 2013-01-01 (×3): 10 mL via ORAL
  Filled 2012-12-28 (×3): qty 15

## 2012-12-28 MED ORDER — HYDRALAZINE HCL 20 MG/ML IJ SOLN: 10.0000 mg | Freq: Four times a day (QID) | INTRAMUSCULAR | Status: DC | PRN

## 2012-12-28 MED ORDER — LABETALOL HCL 5 MG/ML IV SOLN
5.0000 mg | INTRAVENOUS | Status: DC | PRN
  Administered 2012-12-28 (×4): 5 mg via INTRAVENOUS
  Filled 2012-12-28 (×2): qty 4

## 2012-12-28 NOTE — Progress Notes (Signed)
0015- SMOG enema given- pt allowed on 1/2 of the enema solution to be given- she was not able to retain it and had to get up immediately to the [facility]. She did have a BM but would  not allow me to given her the remainder of the enema.

## 2012-12-28 NOTE — Progress Notes (Signed)
[facility] PROGRESS NOTE  Lisa Leblanc UJW:[id number] DOB: Apr 25, 1960 DOA: 12/27/2012 PCP: Dennison Mascot, MD  Brief narrative: 52 year old female with past medical history of narcotic dependence, lumbosacral neuritis, diabetes, hypertension who presented to [facility] ED 12/27/2012 with complaints of poor oral intake as a result of ongoing nausea and vomiting. Patient has had recent admission within the past one month for constipation and acute renal failure secondary to dehydration. In ED, blood pressure was 148/107, heart rate was 88 - 118, T max 99.62F, oxygen saturation was 99% on room air. CBC revealed hemoglobin of 10.9 and BMP revealed potassium of 2.8. Abdominal x-ray revealed nonobstructive bowel gas pattern.   Assessment/Plan:  Principal Problem:   Abdominal pain, nausea and vomiting - Like due to acute pancreatitis - Lipase level was 68 on the admission. - Patient has received 1 L IV fluids on the admission. Diet now advanced to clear liquids. - continue analgesia with OxyContin 60 mg by mouth twice a day. No IV narcotics due to his history of narcotic abuse - Urine drug screen on admission positive for THC and opiates. Followup alcohol level. Active Problems:   Hypokalemia - Secondary to GI losses - Repleted and potassium is now within normal limits   Constipation - Bowel regimen: Colace and MiraLAX twice daily - Patient had one bowel movement this morning   Hypertension - Continue hydralazine 25 mg by mouth 3 times a day   Lumbosacral neuritis - Continue OxyContin 60 mg by mouth twice a day   Diabetes mellitus - Hemoglobin A1c 6.7 in August 2014 indicating good glycemic control - Continue Levemir 20 units at bedtime as well as sliding scale insuline  Code Status: full code Family Communication: no family at the bedside Disposition Plan: home when stable; PT eval ordered   Manson Passey, MD  [facility] Pager [phone]  If 7PM-7AM, please contact  night-coverage [email or web address] Password [email or web address] 12/28/2012, 10:00 AM   LOS: 1 day   Consultants:  None   Procedures:  None   Antibiotics:  None   HPI/Subjective: No acute overnight events.  Objective: Filed Vitals:   12/27/12 2339 12/28/12 0532 12/28/12 0935 12/28/12 0958  BP: 154/86 152/108 160/110 150/106  Pulse: 88 118    Temp: 97.7 F (36.5 C) 99.5 F (37.5 C)    TempSrc: Oral Oral    Resp: 18 20    Weight: 75.887 kg (167 lb 4.8 oz)     SpO2: 100% 99%      Intake/Output Summary (Last 24 hours) at 12/28/12 1000 Last data filed at 12/28/12 0600  Gross per 24 hour  Intake    525 ml  Output      0 ml  Net    525 ml    Exam:   General:  Pt is awake, moaning but does not answer questions, not in acute distress  Cardiovascular: Regular rate and rhythm, S1/S2, no murmurs, no rubs, no gallops  Respiratory: Clear to auscultation bilaterally, no wheezing, no crackles, no rhonchi  Abdomen: Soft, tender in mid abdomen to deep palpation, non distended, bowel sounds present, no guarding  Extremities: No edema, pulses DP and PT palpable bilaterally  Neuro: Grossly nonfocal  Data Reviewed: Basic Metabolic Panel:  Recent Labs Lab 12/27/12 1541 12/27/12 1940 12/28/12 0558  NA 135 134* 134*  K 2.8* 2.9* 3.5  CL 96 95* 99  CO2 24 24 21   GLUCOSE 95 87 105*  BUN 8 8 7   CREATININE 0.77 0.75 0.69  CALCIUM 8.7 8.7 8.5  Liver Function Tests:  Recent Labs Lab 12/27/12 1541 12/27/12 1940 12/28/12 0558  AST 48* 43* 53*  ALT 17 17 18   ALKPHOS 91 86 88  BILITOT 0.5 0.5 0.3  PROT 6.9 6.6 6.4  ALBUMIN 2.8* 2.6* 2.5*    Recent Labs Lab 12/27/12 1541 12/27/12 1940  LIPASE 68* 68*   No results found for this basename: AMMONIA,  in the last 168 hours CBC:  Recent Labs Lab 12/27/12 1541 12/27/12 1940 12/28/12 0558  WBC 4.9 5.4 7.4  NEUTROABS 3.1 3.4  --   HGB 10.9* 11.0* 11.1*  HCT 32.1* 32.3* 33.1*  MCV 81.5 81.6 81.5  PLT 416* 426* 458*    Cardiac Enzymes: No results found for this basename: CKTOTAL, CKMB, CKMBINDEX, TROPONINI,  in the last 168 hours BNP: No components found with this basename: POCBNP,  CBG:  Recent Labs Lab 12/27/12 2343 12/28/12 0804  GLUCAP 94 111*    No results found for this or any previous visit (from the past 240 hour(s)).   Studies: Dg Abd Acute W/chest 12/27/2012  IMPRESSION:  1.  No acute cardiopulmonary process. 2.  Unremarkable, nonobstructed bowel gas pattern. 3.  Subpleural density along the lateral aspect of the left chest wall is nonspecific and may represent callus around a healing lateral rib fracture, or a subpleural nodule/mass.  If there is no clinical history of prior left-sided rib fracture, consider further evaluation with CT scan of the chest to further evaluate.   Original Report Authenticated By: Malachy Moan, M.D.    Scheduled Meds: . docusate sodium  100 mg Oral BID  . heparin  5,000 Units Subcutaneous Q8H  . hydrALAZINE  25 mg Oral TID  . insulin aspart  0-15 Units Subcutaneous TID WC  . insulin aspart  0-5 Units Subcutaneous QHS  . insulin detemir  20 Units Subcutaneous QHS  . lubiprostone  24 mcg Oral BID WC  . OxyCODONE  60 mg Oral BID  . pantoprazole  40 mg Oral Daily  . polyethylene glycol  17 g Oral BID   Continuous Infusions: . sodium chloride 1,000 mL (12/28/12 0017)

## 2012-12-28 NOTE — ED Provider Notes (Signed)
Medical screening examination/treatment/procedure(s) were performed by non-physician practitioner and as supervising physician I was immediately available for consultation/collaboration.  Bartlett Enke R. Fiorela Pelzer, MD 12/28/12 1432 

## 2012-12-28 NOTE — Progress Notes (Signed)
INITIAL NUTRITION ASSESSMENT  Pt meets criteria for severe MALNUTRITION in the context of acute illness as evidenced by 0% intake x 2 weeks with 5.6% weight loss in the past x 2 weeks per pt report.  DOCUMENTATION CODES Per approved criteria  -Severe malnutrition in the context of acute illness or injury -Obesity Unspecified   INTERVENTION: - Diet advancement per MD - Recommend MD monitor pt's magnesium and phosphorus after diet advanced as pt at refeeding risk r/t inability to eat anything x 2 weeks - Will continue to monitor   NUTRITION DIAGNOSIS: Inadequate oral intake related to clear liquid diet order, nausea as evidenced by diet order, pt report.   Goal: 1. Resolution of nausea/vomiting 2. Advance diet as tolerated to diabetic diet  Monitor:  Weights, labs, diet advancement, nausea/vomiting  Reason for Assessment: Poor intake   52 y.o. female  Admitting Dx: Nausea and vomiting  ASSESSMENT: Pt reports being unable to eat anything for 2 weeks related to frequent vomiting. Reports emesis would occur 4-5 times/day and appear orange and green. States she has lost 10 pounds during this time frame. Reports before then she was eating 2 meals/day. C/o nausea, notified RN.   Height: Ht Readings from Last 1 Encounters:  12/09/12 5\' 1"  (1.549 m)    Weight: Wt Readings from Last 1 Encounters:  12/27/12 167 lb 4.8 oz (75.887 kg)    Ideal Body Weight: 105 lb   % Ideal Body Weight: 159%  Wt Readings from Last 10 Encounters:  12/27/12 167 lb 4.8 oz (75.887 kg)  12/27/12 170 lb 1 oz (77.14 kg)  12/09/12 180 lb (81.647 kg)  12/07/12 180 lb 8 oz (81.874 kg)  12/01/12 194 lb (87.998 kg)  11/30/12 194 lb 9.6 oz (88.27 kg)  11/21/12 185 lb 13.6 oz (84.3 kg)  12/04/11 233 lb (105.688 kg)  11/26/11 218 lb 3.2 oz (98.975 kg)    Usual Body Weight: 177 lb per pt  % Usual Body Weight: 94%  BMI:  Body mass index is 31.63 kg/(m^2). Class I obesity  Estimated Nutritional  Needs: Kcal: 1300-1500 Protein: 50-60g Fluid: 1.3-1.5L/day  Skin: Intact   Diet Order: Clear Liquid  EDUCATION NEEDS: -No education needs identified at this time   Intake/Output Summary (Last 24 hours) at 12/28/12 1411 Last data filed at 12/28/12 0600  Gross per 24 hour  Intake    525 ml  Output      0 ml  Net    525 ml    Last BM: 10/7  Labs:   Recent Labs Lab 12/27/12 1541 12/27/12 1940 12/28/12 0558  NA 135 134* 134*  K 2.8* 2.9* 3.5  CL 96 95* 99  CO2 24 24 21   BUN 8 8 7   CREATININE 0.77 0.75 0.69  CALCIUM 8.7 8.7 8.5  GLUCOSE 95 87 105*    CBG (last 3)   Recent Labs  12/27/12 2343 12/28/12 0804 12/28/12 1138  GLUCAP 94 111* 140*    Scheduled Meds: . docusate sodium  100 mg Oral BID  . heparin  5,000 Units Subcutaneous Q8H  . hydrALAZINE  25 mg Oral TID  . insulin aspart  0-15 Units Subcutaneous TID WC  . insulin aspart  0-5 Units Subcutaneous QHS  . insulin detemir  20 Units Subcutaneous QHS  . lubiprostone  24 mcg Oral BID WC  . OxyCODONE  60 mg Oral BID  . pantoprazole  40 mg Oral Daily  . polyethylene glycol  17 g Oral BID  Continuous Infusions: . sodium chloride 1,000 mL (12/28/12 0017)    Past Medical History  Diagnosis Date  . Obesity   . Hypertension   . Lumbosacral neuritis   . Chronic pain   . Diabetes mellitus   . Narcotic dependence     Past Surgical History  Procedure Laterality Date  . Anterior cruciate ligament repair Left   . Cholecystectomy  [address] [place], [place], [place] [phone] Pager [phone] After Hours Pager

## 2012-12-29 DIAGNOSIS — E43 Unspecified severe protein-calorie malnutrition: Secondary | ICD-10-CM | POA: Insufficient documentation

## 2012-12-29 LAB — URINE CULTURE: Colony Count: 70000

## 2012-12-29 LAB — GLUCOSE, CAPILLARY
Glucose-Capillary: 99 mg/dL (ref 70–99)
Glucose-Capillary: 84 mg/dL (ref 70–99)
Glucose-Capillary: 88 mg/dL (ref 70–99)

## 2012-12-29 MED ORDER — SENNOSIDES-DOCUSATE SODIUM 8.6-50 MG PO TABS
1.0000 | ORAL_TABLET | Freq: Two times a day (BID) | ORAL | Status: DC
  Administered 2012-12-29 – 2013-01-01 (×6): 1 via ORAL
  Filled 2012-12-29 (×8): qty 1

## 2012-12-29 MED ORDER — MILK AND MOLASSES ENEMA
Freq: Once | RECTAL | Status: DC
  Filled 2012-12-29: qty 250

## 2012-12-29 MED ORDER — ONDANSETRON 4 MG PO TBDP
4.0000 mg | ORAL_TABLET | Freq: Three times a day (TID) | ORAL | Status: DC | PRN
  Administered 2012-12-30: 4 mg via ORAL
  Filled 2012-12-29 (×3): qty 1

## 2012-12-29 NOTE — Progress Notes (Signed)
[facility] PROGRESS NOTE  Lisa Leblanc:[id number] DOB: 02/14/1961 DOA: 12/27/2012 PCP: Dennison Mascot, MD  Brief narrative: 52 year old female with past medical history of narcotic dependence, lumbosacral neuritis, diabetes, hypertension who presented to [facility] ED 12/27/2012 with complaints of poor oral intake as a result of ongoing nausea and vomiting. Patient has had recent admission within the past one month for constipation and acute renal failure secondary to dehydration. In ED, blood pressure was 148/107, heart rate was 88 - 118, T max 99.69F, oxygen saturation was 99% on room air. CBC revealed hemoglobin of 10.9 and BMP revealed potassium of 2.8. Abdominal x-ray revealed nonobstructive bowel gas pattern.   Assessment/Plan:    Abdominal pain, nausea and vomiting - Like due to mild acute pancreatitis.  - Lipase level was 68 on the admission. - Patient has received 1 L IV fluids on the admission. Diet now advanced to clear liquids. - continue analgesia with OxyContin 60 mg by mouth twice a day. No IV narcotics due to his history of narcotic abuse. Urine drug screen on admission positive for THC and opiates.   Hypokalemia- Secondary to GI losses   Constipation- Bowel regimen: Colace and MiraLAX twice daily. Enema   Hypertension - Continue hydralazine 25 mg by mouth 3 times a day   Lumbosacral neuritis - Continue OxyContin 60 mg by mouth twice a day   Diabetes mellitus - Hemoglobin A1c 6.7 in August 2014 indicating good glycemic control - Continue Levemir 20 units at bedtime as well as sliding scale insuline  Code Status: full code Family Communication: no family at the bedside Disposition Plan: home when stable  Consultants:  None   Procedures:  None   Antibiotics:  None   HPI/Subjective: No acute overnight events.  Objective: Filed Vitals:   12/28/12 2300 12/29/12 0542 12/29/12 0544 12/29/12 1014  BP:  148/97 148/97 148/98  Pulse:   101   Temp: 99.8 F  (37.7 C)  99.1 F (37.3 C)   TempSrc: Oral  Oral   Resp: 20  20   Height:      Weight:      SpO2:   100%     Intake/Output Summary (Last 24 hours) at 12/29/12 1329 Last data filed at 12/28/12 1427  Gross per 24 hour  Intake 633.75 ml  Output      0 ml  Net 633.75 ml    Exam:  General:  NAD;  Cardiovascular: RRR without MRG  Respiratory: Clear to auscultation bilaterally, no wheezing, no crackles, no rhonchi  Abdomen: Soft, tender in mid abdomen to deep palpation, non distended, bowel sounds present, no guarding  Extremities: No edema, pulses DP and PT palpable bilaterally  Neuro: Grossly nonfocal  Data Reviewed: Basic Metabolic Panel:  Recent Labs Lab 12/27/12 1541 12/27/12 1940 12/28/12 0558  NA 135 134* 134*  K 2.8* 2.9* 3.5  CL 96 95* 99  CO2 24 24 21   GLUCOSE 95 87 105*  BUN 8 8 7   CREATININE 0.77 0.75 0.69  CALCIUM 8.7 8.7 8.5   Liver Function Tests:  Recent Labs Lab 12/27/12 1541 12/27/12 1940 12/28/12 0558  AST 48* 43* 53*  ALT 17 17 18   ALKPHOS 91 86 88  BILITOT 0.5 0.5 0.3  PROT 6.9 6.6 6.4  ALBUMIN 2.8* 2.6* 2.5*    Recent Labs Lab 12/27/12 1541 12/27/12 1940  LIPASE 68* 68*   CBC:  Recent Labs Lab 12/27/12 1541 12/27/12 1940 12/28/12 0558  WBC 4.9 5.4 7.4  NEUTROABS 3.1 3.4  --  HGB 10.9* 11.0* 11.1*  HCT 32.1* 32.3* 33.1*  MCV 81.5 81.6 81.5  PLT 416* 426* 458*   CBG:  Recent Labs Lab 12/28/12 1138 12/28/12 1658 12/28/12 2202 12/29/12 0723 12/29/12 1129  GLUCAP 140* 105* 131* 99 92    Recent Results (from the past 240 hour(s))  URINE CULTURE     Status: None   Collection Time    12/27/12 10:33 PM      Result Value Range Status   Specimen Description URINE, CLEAN CATCH   Final   Special Requests NONE   Final   Culture  Setup Time     Final   Value: 12/28/2012 07:38     Performed at [facility] Count     Final   Value: 70,000 COLONIES/ML     Performed at [facility]    Culture     Final   Value: DIPHTHEROIDS(CORYNEBACTERIUM SPECIES)     Note: Standardized susceptibility testing for this organism is not available.     Performed at [facility]   Report Status 12/29/2012 FINAL   Final     Studies: Dg Abd Acute W/chest 12/27/2012  IMPRESSION:  1.  No acute cardiopulmonary process. 2.  Unremarkable, nonobstructed bowel gas pattern. 3.  Subpleural density along the lateral aspect of the left chest wall is nonspecific and may represent callus around a healing lateral rib fracture, or a subpleural nodule/mass.  If there is no clinical history of prior left-sided rib fracture, consider further evaluation with CT scan of the chest to further evaluate.   Original Report Authenticated By: Malachy Moan, M.D.   Scheduled Meds: . docusate sodium  100 mg Oral BID  . heparin  5,000 Units Subcutaneous Q8H  . hydrALAZINE  25 mg Oral TID  . insulin aspart  0-15 Units Subcutaneous TID WC  . insulin aspart  0-5 Units Subcutaneous QHS  . insulin detemir  20 Units Subcutaneous QHS  . labetalol  300 mg Oral Q8H  . lubiprostone  24 mcg Oral BID WC  . milk and molasses   Rectal Once  . OxyCODONE  60 mg Oral BID  . pantoprazole  40 mg Oral Daily  . polyethylene glycol  17 g Oral BID   Continuous Infusions: . sodium chloride 75 mL/hr at 12/29/12 0344   PRN Meds:.acetaminophen, acetaminophen, hydrALAZINE, HYDROcodone-acetaminophen  Pamella Pert, MD  [facility] Pager [phone]  If 7PM-7AM, please contact night-coverage [email or web address] Password [email or web address]

## 2012-12-29 NOTE — Progress Notes (Signed)
PT Cancellation Note  Patient Details Name: Lisa Leblanc MRN: [id number] DOB: Aug 20, 1960   Cancelled Treatment:    Reason Eval/Treat Not Completed: Patient declined, no reason specified. Wil check back tomorrow. Thanks.    Rebeca Alert, MPT Pager: [phone]

## 2012-12-29 NOTE — Progress Notes (Signed)
Patient adamantly refused enema on multiple occasions.  Benefits were explained to the patient, and she was told that it would make her feel better.  Patient still refused and was not willing to give any particular reason besides that she just didn't want to do it.  Philomena Doheny RN

## 2012-12-29 NOTE — Progress Notes (Signed)
PT Note  Attempted PT eval this am-pt declined to participate. Will check back later today. Thanks.  Rebeca Alert, PT [phone]

## 2012-12-30 LAB — BASIC METABOLIC PANEL
BUN: 11 mg/dL (ref 6–23)
Calcium: 7.7 mg/dL — ABNORMAL LOW (ref 8.4–10.5)
Creatinine, Ser: 0.79 mg/dL (ref 0.50–1.10)
GFR calc Af Amer: >90 mL/min (ref >90)
GFR calc non Af Amer: >90 mL/min (ref >90)

## 2012-12-30 LAB — GLUCOSE, CAPILLARY
Glucose-Capillary: 74 mg/dL (ref 70–99)
Glucose-Capillary: 68 mg/dL — ABNORMAL LOW (ref 70–99)
Glucose-Capillary: 114 mg/dL — ABNORMAL HIGH (ref 70–99)
Glucose-Capillary: 82 mg/dL (ref 70–99)

## 2012-12-30 LAB — CBC
HCT: 26.8 % — ABNORMAL LOW (ref 36.0–46.0)
MCH: 28 pg (ref 26.0–34.0)
MCHC: 34.7 g/dL (ref 30.0–36.0)
MCV: 80.7 fL (ref 78.0–100.0)
Platelets: 375 10*3/uL (ref 150–400)
RBC: 3.32 MIL/uL — ABNORMAL LOW (ref 3.87–5.11)
RDW: 19 % — ABNORMAL HIGH (ref 11.5–15.5)
WBC: 13.6 10*3/uL — ABNORMAL HIGH (ref 4.0–10.5)

## 2012-12-30 MED ORDER — INSULIN DETEMIR 100 UNIT/ML ~~LOC~~ SOLN
15.0000 [IU] | Freq: Every day | SUBCUTANEOUS | Status: DC
  Filled 2012-12-30: qty 0.15

## 2012-12-30 MED ORDER — POTASSIUM CHLORIDE 10 MEQ/100ML IV SOLN
10.0000 meq | INTRAVENOUS | Status: DC
  Administered 2012-12-30: 10 meq via INTRAVENOUS
  Filled 2012-12-30 (×6): qty 100

## 2012-12-30 MED ORDER — POTASSIUM CHLORIDE CRYS ER 20 MEQ PO TBCR
40.0000 meq | EXTENDED_RELEASE_TABLET | ORAL | Status: AC
  Administered 2012-12-30 (×3): 40 meq via ORAL
  Filled 2012-12-30 (×3): qty 2

## 2012-12-30 MED ORDER — MAGNESIUM SULFATE 40 MG/ML IJ SOLN
2.0000 g | Freq: Once | INTRAMUSCULAR | Status: AC
  Administered 2012-12-30: 2 g via INTRAVENOUS
  Filled 2012-12-30: qty 50

## 2012-12-30 MED ORDER — INSULIN DETEMIR 100 UNIT/ML ~~LOC~~ SOLN
10.0000 [IU] | Freq: Every day | SUBCUTANEOUS | Status: DC
  Administered 2012-12-30: 10 [IU] via SUBCUTANEOUS
  Filled 2012-12-30: qty 0.1

## 2012-12-30 NOTE — Progress Notes (Signed)
CRITICAL VALUE ALERT  Critical value received:  K+ 2.6  Date of notification:  10/9  Time of notification:  0639  Critical value read back: yes  Nurse who received alert:  Gennaro Africa  MD notified (1st page):   Craige Cotta  Time of first page:   [phone]  MD notified (2nd page):  Time of second page:  Responding MD:   Time MD responded:

## 2012-12-30 NOTE — Progress Notes (Signed)
Blood glucose remains low, 1/2 amp. D50 given per protocol. MD notified. Will decrease tonight's lantus insulin. Will continue to monitor.

## 2012-12-30 NOTE — Evaluation (Signed)
Physical Therapy Evaluation Patient Details Name: Lisa Leblanc MRN: [id number] DOB: May 10, 1960 Today's Date: 12/30/2012 Time: 1478-2956 PT Time Calculation (min): 29 min  PT Assessment / Plan / Recommendation History of Present Illness  52 yo female admitted with N/V, mild acute pancreatitis  Clinical Impression  On eval, pt required Min assist for mobility-able to ambulate ~20 feet in room while pushing IV pole. MAX encouragement for participation. Pt only agreeable to ambulation in room. Demonstrates general deconditioning. Recommend supervision/assist for mobility at home. Limited eval due to lack of participation    PT Assessment  Patient needs continued PT services    Follow Up Recommendations  Supervision for mobility/OOB    Does the patient have the potential to tolerate intense rehabilitation      Barriers to Discharge        Equipment Recommendations  None recommended by PT    Recommendations for Other Services OT consult   Frequency Min 3X/week    Precautions / Restrictions Precautions Precautions: Fall Restrictions Weight Bearing Restrictions: No   Pertinent Vitals/Pain "Dont feel good"      Mobility  Bed Mobility Bed Mobility: Supine to Sit;Sit to Supine Supine to Sit: HOB elevated;4: Min assist Sit to Supine: 6: Modified independent (Device/Increase time);HOB elevated Transfers Transfers: Sit to Stand;Stand to Sit Sit to Stand: 4: Min assist;From bed Stand to Sit: 4: Min guard;To bed Details for Transfer Assistance: Assist to rise, steady,.  Ambulation/Gait Ambulation/Gait Assistance: 4: Min assist Ambulation Distance (Feet): 20 Feet Assistive device: 1 person hand held assist Ambulation/Gait Assistance Details: Pt only agreeable to ambulation in room. slightly unsteady-relied on IV pole Gait Pattern: Step-through pattern;Decreased stride length    Exercises     PT Diagnosis: Difficulty walking;Generalized weakness  PT Problem List:  Decreased strength;Decreased mobility;Decreased balance;Decreased activity tolerance;Decreased knowledge of use of DME PT Treatment Interventions: DME instruction;Gait training;Functional mobility training;Therapeutic activities;Therapeutic exercise;Patient/family education;Balance training     PT Goals(Current goals can be found in the care plan section) Acute Rehab PT Goals Patient Stated Goal: home by the weekend PT Goal Formulation: With patient Time For Goal Achievement: 01/13/13 Potential to Achieve Goals: Fair  Visit Information  Last PT Received On: 12/30/12 Assistance Needed: +1 History of Present Illness: 52 yo female admitted with N/V, mild acute pancreatitis       Prior Functioning  Home Living Family/patient expects to be discharged to:: Private residence Living Arrangements: Other relatives Type of Home: House Home Access: Stairs to enter Secretary/administrator of Steps: pt would not state Home Layout: One level Home Equipment: None Prior Function Level of Independence: Independent Communication Communication: No difficulties    Cognition  Cognition Arousal/Alertness: Awake/alert Behavior During Therapy: Flat affect Overall Cognitive Status: Difficult to assess    Extremity/Trunk Assessment Upper Extremity Assessment Upper Extremity Assessment: Generalized weakness Lower Extremity Assessment Lower Extremity Assessment: Generalized weakness Cervical / Trunk Assessment Cervical / Trunk Assessment: Normal   Balance    End of Session PT - End of Session Activity Tolerance: Patient limited by fatigue Patient left: in bed;with call bell/phone within reach Nurse Communication: Mobility status  GP     Rebeca Alert, MPT Pager: [phone]

## 2012-12-30 NOTE — Progress Notes (Signed)
NUTRITION FOLLOW UP  Intervention:   - Recommend MD continue to monitor pt's potassium, phosphorus, and magnesium daily as PO intake improves as pt at risk for refeeding syndrome - Anti-emetics per MD - Will continue to monitor   Nutrition Dx:   Inadequate oral intake related to clear liquid diet order, nausea as evidenced by diet order, pt report - ongoing but now just related to nausea/vomiting per pt report  Goal:   1. Resolution of nausea/vomiting - not met 2. Advance diet as tolerated to diabetic diet - advanced to fat modified diet  New goals: 1. Pt to consume >50% of meals.  2. Potassium, magnesium, and phosphorus level WNL  Monitor:   Weights, labs, intake, nausea/vomiting  Assessment:   Met with pt who c/o ongoing nausea states she had 3 episodes of vomiting yesterday. Pt was sitting on edge of bed, bending over, trying not to vomit during visit. Pt at risk of refeeding r/t not being able to eat for 2 weeks. Pt with low potassium and low magnesium. Text paged MD to request order for phosphorus level which was found to be WNL. Pt had not eaten any of breakfast this morning which consisted of clear liquids. Lipase and AST elevated.    Potassium  Date/Time Value Range Status  12/30/2012  5:27 AM 2.6* 3.5 - 5.1 mEq/L Final     DELTA CHECK NOTED     REPEATED TO VERIFY     CRITICAL RESULT CALLED TO, READ BACK BY AND VERIFIED WITH:     TOTTEN,K. RN AT 1478 12/30/12 BARFIELD,T  12/28/2012  5:58 AM 3.5  3.5 - 5.1 mEq/L Final     DELTA CHECK NOTED     REPEATED TO VERIFY     NO VISIBLE HEMOLYSIS  12/27/2012  7:40 PM 2.9* 3.5 - 5.1 mEq/L Final    Phosphorus  Date/Time Value Range Status  12/30/2012  5:27 AM 2.3  2.3 - 4.6 mg/dL Final    Magnesium  Date/Time Value Range Status  12/30/2012  5:27 AM 1.3* 1.5 - 2.5 mg/dL Final  2/95/6213  0:86 AM 2.3  1.5 - 2.5 mg/dL Final  5/78/4696  2:95 PM 2.8* 1.5 - 2.5 mg/dL Final     Height: Ht Readings from Last 1 Encounters:   12/28/12 5\' 3"  (1.6 m)    Weight Status:   Wt Readings from Last 1 Encounters:  12/27/12 167 lb 4.8 oz (75.887 kg)    Re-estimated needs:  Kcal: 1300-1500 Protein: 50-60g Fluid: 1.3-1.5L/day  Skin: Intact  Diet Order: Fat Restricted   Intake/Output Summary (Last 24 hours) at 12/30/12 0954 Last data filed at 12/30/12 0659  Gross per 24 hour  Intake   3040 ml  Output      0 ml  Net   3040 ml    Last BM: 10/7   Labs:   Recent Labs Lab 12/27/12 1940 12/28/12 0558 12/30/12 0527  NA 134* 134* 134*  K 2.9* 3.5 2.6*  CL 95* 99 104  CO2 24 21 21   BUN 8 7 11   CREATININE 0.75 0.69 0.79  CALCIUM 8.7 8.5 7.7*  MG  --   --  1.3*  PHOS  --   --  2.3  GLUCOSE 87 105* 68*    CBG (last 3)   Recent Labs  12/29/12 2128 12/30/12 0756 12/30/12 0938  GLUCAP 88 58* 84    Scheduled Meds: . docusate sodium  100 mg Oral BID  . heparin  5,000 Units Subcutaneous Q8H  .  hydrALAZINE  25 mg Oral TID  . insulin aspart  0-15 Units Subcutaneous TID WC  . insulin aspart  0-5 Units Subcutaneous QHS  . insulin detemir  20 Units Subcutaneous QHS  . labetalol  300 mg Oral Q8H  . lubiprostone  24 mcg Oral BID WC  . magnesium sulfate 1 - 4 g bolus IVPB  2 g Intravenous Once  . milk and molasses   Rectal Once  . OxyCODONE  60 mg Oral BID  . pantoprazole  40 mg Oral Daily  . polyethylene glycol  17 g Oral BID  . potassium chloride  40 mEq Oral Q3H  . senna-docusate  1 tablet Oral BID    Continuous Infusions: . sodium chloride 75 mL/hr at 12/30/12 0536     Levon Hedger MS, RD, LDN [phone] Pager [phone] After Hours Pager

## 2012-12-30 NOTE — Progress Notes (Signed)
[facility] PROGRESS NOTE  RONNETTE RUMP ZOX:[id number] DOB: 12/20/1960 DOA: 12/27/2012 PCP: Dennison Mascot, MD  Assessment/Plan:  Abdominal pain, nausea and vomiting - Like due to mild acute pancreatitis.  - Lipase level was 68 on the admission. - tolerating liquid diet yesterday, wants to try solid food, will advance to fat free.  - continue analgesia with OxyContin 60 mg by mouth twice a day. No IV narcotics due to his history of narcotic abuse. Urine drug screen on admission positive for THC and opiates. Hypokalemia- Secondary to GI losses, replete again today.  Hypomagnesemia - replete Constipation- Bowel regimen: Colace and MiraLAX twice daily. Refuses Enema Hypertension- Continue hydralazine 25 mg by mouth 3 times a day Lumbosacral neuritis- Continue OxyContin 60 mg by mouth twice a day Diabetes mellitus- Hemoglobin A1c 6.7 in August 2014 indicating good glycemic control - hypoglycemic this morning, decrease long acting to 15 U  Code Status: full code Family Communication: no family at the bedside Disposition Plan: home when stable  Consultants:  None   Procedures:  None   Antibiotics:  None   HPI/Subjective: - states she is not feeling good.  Objective: Filed Vitals:   12/29/12 1406 12/29/12 1645 12/29/12 2106 12/30/12 0536  BP: 123/78 129/83 122/80 131/84  Pulse: 62  90 83  Temp: 99.9 F (37.7 C)  99.9 F (37.7 C) 97.9 F (36.6 C)  TempSrc: Oral  Oral Oral  Resp: 20  18 18   Height:      Weight:      SpO2: 98%  97% 96%    Intake/Output Summary (Last 24 hours) at 12/30/12 0757 Last data filed at 12/30/12 0659  Gross per 24 hour  Intake   3040 ml  Output      0 ml  Net   3040 ml    Exam:  General:  NAD  Cardiovascular: RRR without MRG  Respiratory: Clear to auscultation bilaterally, no wheezing, no crackles, no rhonchi  Abdomen: Soft, tender in mid abdomen to deep palpation, non distended, bowel sounds present, no  guarding  Extremities: No edema, pulses DP and PT palpable bilaterally  Neuro: Grossly nonfocal  Data Reviewed: Basic Metabolic Panel:  Recent Labs Lab 12/27/12 1541 12/27/12 1940 12/28/12 0558 12/30/12 0527  NA 135 134* 134* 134*  K 2.8* 2.9* 3.5 2.6*  CL 96 95* 99 104  CO2 24 24 21 21   GLUCOSE 95 87 105* 68*  BUN 8 8 7 11   CREATININE 0.77 0.75 0.69 0.79  CALCIUM 8.7 8.7 8.5 7.7*  MG  --   --   --  1.3*   Liver Function Tests:  Recent Labs Lab 12/27/12 1541 12/27/12 1940 12/28/12 0558  AST 48* 43* 53*  ALT 17 17 18   ALKPHOS 91 86 88  BILITOT 0.5 0.5 0.3  PROT 6.9 6.6 6.4  ALBUMIN 2.8* 2.6* 2.5*    Recent Labs Lab 12/27/12 1541 12/27/12 1940  LIPASE 68* 68*   CBC:  Recent Labs Lab 12/27/12 1541 12/27/12 1940 12/28/12 0558 12/30/12 0527  WBC 4.9 5.4 7.4 13.6*  NEUTROABS 3.1 3.4  --   --   HGB 10.9* 11.0* 11.1* 9.3*  HCT 32.1* 32.3* 33.1* 26.8*  MCV 81.5 81.6 81.5 80.7  PLT 416* 426* 458* 375   CBG:  Recent Labs Lab 12/28/12 2202 12/29/12 0723 12/29/12 1129 12/29/12 1617 12/29/12 2128  GLUCAP 131* 99 92 84 88    Recent Results (from the past 240 hour(s))  URINE CULTURE     Status:  None   Collection Time    12/27/12 10:33 PM      Result Value Range Status   Specimen Description URINE, CLEAN CATCH   Final   Special Requests NONE   Final   Culture  Setup Time     Final   Value: 12/28/2012 07:38     Performed at [facility]   Colony Count     Final   Value: 70,000 COLONIES/ML     Performed at [facility]   Culture     Final   Value: DIPHTHEROIDS(CORYNEBACTERIUM SPECIES)     Note: Standardized susceptibility testing for this organism is not available.     Performed at [facility]   Report Status 12/29/2012 FINAL   Final     Studies: Dg Abd Acute W/chest 12/27/2012  IMPRESSION:  1.  No acute cardiopulmonary process. 2.  Unremarkable, nonobstructed bowel gas pattern. 3.  Subpleural density along the  lateral aspect of the left chest wall is nonspecific and may represent callus around a healing lateral rib fracture, or a subpleural nodule/mass.  If there is no clinical history of prior left-sided rib fracture, consider further evaluation with CT scan of the chest to further evaluate.   Original Report Authenticated By: Malachy Moan, M.D.   Scheduled Meds: . docusate sodium  100 mg Oral BID  . heparin  5,000 Units Subcutaneous Q8H  . hydrALAZINE  25 mg Oral TID  . insulin aspart  0-15 Units Subcutaneous TID WC  . insulin aspart  0-5 Units Subcutaneous QHS  . insulin detemir  20 Units Subcutaneous QHS  . labetalol  300 mg Oral Q8H  . lubiprostone  24 mcg Oral BID WC  . milk and molasses   Rectal Once  . OxyCODONE  60 mg Oral BID  . pantoprazole  40 mg Oral Daily  . polyethylene glycol  17 g Oral BID  . potassium chloride  10 mEq Intravenous Q1 Hr x 6  . senna-docusate  1 tablet Oral BID   Continuous Infusions: . sodium chloride 75 mL/hr at 12/30/12 0536   PRN Meds:.acetaminophen, acetaminophen, hydrALAZINE, HYDROcodone-acetaminophen, ondansetron  Pamella Pert, MD  [facility] Pager [phone]  If 7PM-7AM, please contact night-coverage [email or web address] Password [email or web address]

## 2012-12-30 NOTE — Progress Notes (Signed)
Inpatient Diabetes Program Recommendations  AACE/ADA: New Consensus Statement on Inpatient Glycemic Control (2013)  Target Ranges:  Prepandial:   less than 140 mg/dL      Peak postprandial:   less than 180 mg/dL (1-2 hours)      Critically ill patients:  140 - 180 mg/dL   Results for Lisa Leblanc, Lisa Leblanc (MRN [id number]) as of 12/30/2012 12:11  Ref. Range 12/29/2012 07:23 12/29/2012 11:29 12/29/2012 16:17 12/29/2012 21:28 12/30/2012 07:56 12/30/2012 09:38 12/30/2012 11:48  Glucose-Capillary Latest Range: 70-99 mg/dL 99 92 84 88 58 (L) 84 74    Inpatient Diabetes Program Recommendations Insulin - Basal: Please consider decreasing Levemir to 15 units QHS.  Note: Noted blood glucose of 58 mg/dl this morning at 8:29 am.  Please consider decreasing Levemir to 15 units QHS to prevent further episodes of hypoglycemia.  Will continue to follow while inpatient.  Thanks, Orlando Penner, RN, MSN, CCRN Diabetes Coordinator Inpatient Diabetes Program [phone] (Team Pager) [phone] ([facility] office) [phone] [facility] office)

## 2012-12-31 DIAGNOSIS — R112 Nausea with vomiting, unspecified: Secondary | ICD-10-CM

## 2012-12-31 LAB — GLUCOSE, CAPILLARY
Glucose-Capillary: 61 mg/dL — ABNORMAL LOW (ref 70–99)
Glucose-Capillary: 84 mg/dL (ref 70–99)
Glucose-Capillary: 98 mg/dL (ref 70–99)

## 2012-12-31 LAB — LIPASE, BLOOD: Lipase: 85 U/L — ABNORMAL HIGH (ref 11–59)

## 2012-12-31 LAB — BASIC METABOLIC PANEL
Chloride: 105 mEq/L (ref 96–112)
GFR calc Af Amer: >90 mL/min (ref >90)
GFR calc non Af Amer: >90 mL/min (ref >90)
Potassium: 2.6 mEq/L — CL (ref 3.5–5.1)
Sodium: 133 mEq/L — ABNORMAL LOW (ref 135–145)

## 2012-12-31 LAB — CBC
HCT: 26.2 % — ABNORMAL LOW (ref 36.0–46.0)
Hemoglobin: 9.1 g/dL — ABNORMAL LOW (ref 12.0–15.0)
MCH: 27.8 pg (ref 26.0–34.0)
Platelets: 383 10*3/uL (ref 150–400)
RBC: 3.27 MIL/uL — ABNORMAL LOW (ref 3.87–5.11)
WBC: 12.3 10*3/uL — ABNORMAL HIGH (ref 4.0–10.5)

## 2012-12-31 LAB — MAGNESIUM: Magnesium: 1.7 mg/dL (ref 1.5–2.5)

## 2012-12-31 MED ORDER — ADULT MULTIVITAMIN W/MINERALS CH
1.0000 | ORAL_TABLET | Freq: Every day | ORAL | Status: DC
  Administered 2012-12-31 – 2013-01-01 (×2): 1 via ORAL
  Filled 2012-12-31 (×2): qty 1

## 2012-12-31 MED ORDER — PREDNISONE 50 MG PO TABS
50.0000 mg | ORAL_TABLET | Freq: Four times a day (QID) | ORAL | Status: AC
  Administered 2012-12-31 – 2013-01-01 (×3): 50 mg via ORAL
  Filled 2012-12-31 (×3): qty 1

## 2012-12-31 MED ORDER — ONDANSETRON 4 MG PO TBDP
4.0000 mg | ORAL_TABLET | Freq: Four times a day (QID) | ORAL | Status: DC
  Administered 2012-12-31 – 2013-01-01 (×4): 4 mg via ORAL
  Filled 2012-12-31 (×8): qty 1

## 2012-12-31 MED ORDER — DIPHENHYDRAMINE HCL 50 MG PO CAPS
50.0000 mg | ORAL_CAPSULE | Freq: Once | ORAL | Status: AC
  Administered 2013-01-01: 50 mg via ORAL
  Filled 2012-12-31: qty 1

## 2012-12-31 MED ORDER — BOOST / RESOURCE BREEZE PO LIQD: 1.0000 | ORAL | Status: DC | PRN

## 2012-12-31 MED ORDER — POTASSIUM CHLORIDE CRYS ER 20 MEQ PO TBCR
40.0000 meq | EXTENDED_RELEASE_TABLET | ORAL | Status: AC
  Administered 2012-12-31 (×3): 40 meq via ORAL
  Filled 2012-12-31 (×5): qty 2

## 2012-12-31 MED ORDER — GLUCOSE 40 % PO GEL
ORAL | Status: AC
  Administered 2012-12-31: 37.5 g
  Filled 2012-12-31: qty 1

## 2012-12-31 MED ORDER — MAGNESIUM SULFATE 40 MG/ML IJ SOLN
2.0000 g | Freq: Once | INTRAMUSCULAR | Status: AC
  Administered 2012-12-31: 2 g via INTRAVENOUS
  Filled 2012-12-31: qty 50

## 2012-12-31 MED ORDER — BOOST / RESOURCE BREEZE PO LIQD
1.0000 | Freq: Three times a day (TID) | ORAL | Status: DC
  Administered 2012-12-31 – 2013-01-01 (×3): 1 via ORAL

## 2012-12-31 NOTE — Progress Notes (Signed)
[facility] PROGRESS NOTE  FAE BLOSSOM ZOX:[id number] DOB: 1960/10/02 DOA: 12/27/2012 PCP: Dennison Mascot, MD  Assessment/Plan:  Abdominal pain, nausea and vomiting - her pancreatitis alone is not fully explaining her symptoms especially since they have been going on for a month. Continues to have severe nausea with vomiting and severe hypokalemia.  - continue analgesia with OxyContin 60 mg by mouth twice a day. No IV narcotics due to his history of narcotic abuse. Urine drug screen on admission positive for THC and opiates. - will pursue a CT abdomen pelvis with Prednisone/benadryl prep. Consulted GI, appreciate their help. She was recently evaluated as an outpatient 2-3 weeks ago with a negative EGD. Hypokalemia- Secondary to GI losses, replete again today.  Hypomagnesemia - replete Constipation- Bowel regimen: Colace and MiraLAX twice daily. Refuses Enema Hypertension- Continue hydralazine 25 mg by mouth 3 times a day Lumbosacral neuritis- Continue OxyContin 60 mg by mouth twice a day Diabetes mellitus- Hemoglobin A1c 6.7 in August 2014 indicating good glycemic control - hypoglycemic this morning, stop the long acting insulin altogether. I do suspect that she will become more hyperglycemic shortly given prednisone for IV contrast.  Code Status: full code Family Communication: no family at the bedside Disposition Plan: home when stable  Consultants:  GI  Procedures:  None   Antibiotics:  None   HPI/Subjective: - continues to have nausea and vomiting and unable to have any po intake.   Objective: Filed Vitals:   12/30/12 0536 12/30/12 1436 12/30/12 2200 12/31/12 0600  BP: 131/84 118/82 121/76 122/77  Pulse: 83 87 87 83  Temp: 97.9 F (36.6 C) 98.2 F (36.8 C) 100 F (37.8 C) 99 F (37.2 C)  TempSrc: Oral Oral Oral Oral  Resp: 18 18 18 18   Height:      Weight:      SpO2: 96% 100% 98% 100%    Intake/Output Summary (Last 24 hours) at 12/31/12  1208 Last data filed at 12/31/12 0500  Gross per 24 hour  Intake   2065 ml  Output      0 ml  Net   2065 ml    Exam:  General:  NAD  Cardiovascular: RRR without MRG  Respiratory: Clear to auscultation bilaterally, no wheezing, no crackles, no rhonchi  Abdomen: Soft, tender in mid abdomen to deep palpation, non distended, bowel sounds present, no guarding  Extremities: No edema, pulses DP and PT palpable bilaterally  Neuro: Grossly nonfocal  Data Reviewed: Basic Metabolic Panel:  Recent Labs Lab 12/27/12 1541 12/27/12 1940 12/28/12 0558 12/30/12 0527 12/31/12 0537  NA 135 134* 134* 134* 133*  K 2.8* 2.9* 3.5 2.6* 2.6*  CL 96 95* 99 104 105  CO2 24 24 21 21 20   GLUCOSE 95 87 105* 68* 49*  BUN 8 8 7 11 11   CREATININE 0.77 0.75 0.69 0.79 0.75  CALCIUM 8.7 8.7 8.5 7.7* 7.7*  MG  --   --   --  1.3* 1.7  PHOS  --   --   --  2.3  --    Liver Function Tests:  Recent Labs Lab 12/27/12 1541 12/27/12 1940 12/28/12 0558  AST 48* 43* 53*  ALT 17 17 18   ALKPHOS 91 86 88  BILITOT 0.5 0.5 0.3  PROT 6.9 6.6 6.4  ALBUMIN 2.8* 2.6* 2.5*    Recent Labs Lab 12/27/12 1541 12/27/12 1940 12/31/12 0537  LIPASE 68* 68* 85*   CBC:  Recent Labs Lab 12/27/12 1541 12/27/12 1940 12/28/12 0558 12/30/12 8119  12/31/12 0537  WBC 4.9 5.4 7.4 13.6* 12.3*  NEUTROABS 3.1 3.4  --   --   --   HGB 10.9* 11.0* 11.1* 9.3* 9.1*  HCT 32.1* 32.3* 33.1* 26.8* 26.2*  MCV 81.5 81.6 81.5 80.7 80.1  PLT 416* 426* 458* 375 383   CBG:  Recent Labs Lab 12/30/12 1907 12/30/12 2156 12/31/12 0757 12/31/12 0845 12/31/12 1202  GLUCAP 114* 82 61* 84 80    Recent Results (from the past 240 hour(s))  URINE CULTURE     Status: None   Collection Time    12/27/12 10:33 PM      Result Value Range Status   Specimen Description URINE, CLEAN CATCH   Final   Special Requests NONE   Final   Culture  Setup Time     Final   Value: 12/28/2012 07:38     Performed at [facility]     Final   Value: 70,000 COLONIES/ML     Performed at [facility]   Culture     Final   Value: DIPHTHEROIDS(CORYNEBACTERIUM SPECIES)     Note: Standardized susceptibility testing for this organism is not available.     Performed at [facility]   Report Status 12/29/2012 FINAL   Final     Studies: Dg Abd Acute W/chest 12/27/2012  IMPRESSION:  1.  No acute cardiopulmonary process. 2.  Unremarkable, nonobstructed bowel gas pattern. 3.  Subpleural density along the lateral aspect of the left chest wall is nonspecific and may represent callus around a healing lateral rib fracture, or a subpleural nodule/mass.  If there is no clinical history of prior left-sided rib fracture, consider further evaluation with CT scan of the chest to further evaluate.   Original Report Authenticated By: Malachy Moan, M.D.   Scheduled Meds: . [START ON 01/01/2013] diphenhydrAMINE  50 mg Oral Once  . docusate sodium  100 mg Oral BID  . feeding supplement (RESOURCE BREEZE)  1 Container Oral TID BM  . heparin  5,000 Units Subcutaneous Q8H  . hydrALAZINE  25 mg Oral TID  . insulin aspart  0-15 Units Subcutaneous TID WC  . insulin aspart  0-5 Units Subcutaneous QHS  . labetalol  300 mg Oral Q8H  . lubiprostone  24 mcg Oral BID WC  . magnesium sulfate 1 - 4 g bolus IVPB  2 g Intravenous Once  . milk and molasses   Rectal Once  . multivitamin with minerals  1 tablet Oral Daily  . ondansetron  4 mg Oral Q6H  . OxyCODONE  60 mg Oral BID  . pantoprazole  40 mg Oral Daily  . polyethylene glycol  17 g Oral BID  . potassium chloride  40 mEq Oral Q3H  . predniSONE  50 mg Oral Q6H  . senna-docusate  1 tablet Oral BID   Continuous Infusions: . sodium chloride 75 mL/hr at 12/30/12 0536   PRN Meds:.acetaminophen, acetaminophen, feeding supplement (RESOURCE BREEZE), hydrALAZINE, HYDROcodone-acetaminophen  Pamella Pert, MD  [facility] Pager [phone]  If 7PM-7AM, please  contact night-coverage [email or web address] Password [email or web address]

## 2012-12-31 NOTE — Progress Notes (Signed)
Physical Therapy Treatment Patient Details Name: Lisa Leblanc MRN: [id number] DOB: 1960/09/03 Today's Date: 12/31/2012 Time: 4098-1191 PT Time Calculation (min): 20 min  PT Assessment / Plan / Recommendation  History of Present Illness pt states she does not feel well today   PT Comments   Pt needs much encouragement but she is able to ambulate with RW. Recommend that she be out of bed and ambulate short distances as much as possible.  Follow Up Recommendations  No PT follow up     Does the patient have the potential to tolerate intense rehabilitation     Barriers to Discharge        Equipment Recommendations  Rolling walker with 5" wheels (possibly RW)    Recommendations for Other Services    Frequency     Progress towards PT Goals Progress towards PT goals: Progressing toward goals  Plan Current plan remains appropriate    Precautions / Restrictions Precautions Precautions: Fall Restrictions Weight Bearing Restrictions: No   Pertinent Vitals/Pain Pt c/o feeling sore and weak all over    Mobility  Bed Mobility Bed Mobility: Supine to Sit;Sit to Supine Supine to Sit: HOB elevated;4: Min assist Sit to Supine: 6: Modified independent (Device/Increase time);HOB elevated Details for Bed Mobility Assistance: pt takes excessive time to move  Transfers Transfers: Sit to Stand;Stand to Sit Sit to Stand: From bed;5: Supervision Stand to Sit: To bed;5: Supervision Details for Transfer Assistance: much encouragement needed Ambulation/Gait Ambulation/Gait Assistance: 4: Min assist Ambulation Distance (Feet): 25 Feet Assistive device: 1 person hand held assist;Rolling walker Ambulation/Gait Assistance Details: pt walked a short distance into hall Gait Pattern: Step-through pattern;Decreased stride length Gait velocity: decreased General Gait Details: pt with debilitation, c/o generalized weakness Stairs: No Wheelchair Mobility Wheelchair Mobility: No    Exercises  General Exercises - Lower Extremity Ankle Circles/Pumps: AROM;Both;5 reps;Supine Hip Flexion/Marching: AROM;Both;5 reps;Supine;AAROM   PT Diagnosis:    PT Problem List:   PT Treatment Interventions:     PT Goals (current goals can now be found in the care plan section)    Visit Information  Last PT Received On: 12/31/12 Assistance Needed: +1 History of Present Illness: pt states she does not feel well today    Subjective Data      Cognition  Cognition Arousal/Alertness: Awake/alert Behavior During Therapy: WFL for tasks assessed/performed (at end of treatment.  Pt flat at first) Overall Cognitive Status: Difficult to assess    Balance  Balance Balance Assessed: Yes Static Sitting Balance Static Sitting - Balance Support: No upper extremity supported;Feet supported Static Sitting - Level of Assistance: 7: Independent Static Standing Balance Static Standing - Balance Support: Bilateral upper extremity supported;During functional activity Static Standing - Level of Assistance: 6: Modified independent (Device/Increase time)  End of Session PT - End of Session Activity Tolerance: Patient limited by fatigue Patient left: in bed;with call bell/phone within reach   GP    Teresa K. Manson Passey, [place] [phone] 12/31/2012, 4:21 PM

## 2012-12-31 NOTE — Progress Notes (Signed)
NUTRITION FOLLOW UP  Intervention:   - Recommend MD continue to monitor pt's potassium, phosphorus, and magnesium daily as PO intake improves as pt at risk for refeeding syndrome - Anti-emetics per MD - Resource Breeze TID and PRN to help with low blood sugar  - Multivitamin 1 tablet PO daily - Will continue to monitor   Nutrition Dx:   Inadequate oral intake related to nausea/vomiting as evidenced by pt report - ongoing   Goal:   1. Resolution of nausea/vomiting - not met 2. Pt to consume >50% of meals - not met 3. Potassium, magnesium, and phosphorus level WNL - not met  Monitor:   Weights, labs, intake, nausea/vomiting  Assessment:   Pt reports she was not able to eat anything at all yesterday. Observed untouched breakfast tray in room of fruit/cheese/yogurt. Pt c/o ongoing nausea/vomiting not improved since admission. Noted pt with low blood sugar yesterday and today. Discussed pt's nutrition with MD. Potassium low today, magnesium WNL, and phosphorus from yesterday WNL.    Potassium  Date/Time Value Range Status  12/31/2012  5:37 AM 2.6* 3.5 - 5.1 mEq/L Final     CRITICAL RESULT CALLED TO, READ BACK BY AND VERIFIED WITH:     TRYAN RN AT 0625 ON [id number] BY DLONG  12/30/2012  5:27 AM 2.6* 3.5 - 5.1 mEq/L Final     DELTA CHECK NOTED     REPEATED TO VERIFY     CRITICAL RESULT CALLED TO, READ BACK BY AND VERIFIED WITH:     TOTTEN,K. RN AT 9629 12/30/12 BARFIELD,T  12/28/2012  5:58 AM 3.5  3.5 - 5.1 mEq/L Final     DELTA CHECK NOTED     REPEATED TO VERIFY     NO VISIBLE HEMOLYSIS    Phosphorus  Date/Time Value Range Status  12/30/2012  5:27 AM 2.3  2.3 - 4.6 mg/dL Final    Magnesium  Date/Time Value Range Status  12/31/2012  5:37 AM 1.7  1.5 - 2.5 mg/dL Final  52/10/4130  4:40 AM 1.3* 1.5 - 2.5 mg/dL Final  03/26/7251  6:64 AM 2.3  1.5 - 2.5 mg/dL Final     Height: Ht Readings from Last 1 Encounters:  12/28/12 5\' 3"  (1.6 m)    Weight Status:   Wt Readings from Last  1 Encounters:  12/27/12 167 lb 4.8 oz (75.887 kg)    Re-estimated needs:  Kcal: 1300-1500 Protein: 50-60g Fluid: 1.3-1.5L/day  Skin: Intact  Diet Order: Fat Restricted   Intake/Output Summary (Last 24 hours) at 12/31/12 1018 Last data filed at 12/31/12 0500  Gross per 24 hour  Intake   2065 ml  Output      0 ml  Net   2065 ml    Last BM: 10/7   Labs:   Recent Labs Lab 12/28/12 0558 12/30/12 0527 12/31/12 0537  NA 134* 134* 133*  K 3.5 2.6* 2.6*  CL 99 104 105  CO2 21 21 20   BUN 7 11 11   CREATININE 0.69 0.79 0.75  CALCIUM 8.5 7.7* 7.7*  MG  --  1.3* 1.7  PHOS  --  2.3  --   GLUCOSE 105* 68* 49*    CBG (last 3)   Recent Labs  12/30/12 2156 12/31/12 0757 12/31/12 0845  GLUCAP 82 61* 84    Scheduled Meds: . docusate sodium  100 mg Oral BID  . heparin  5,000 Units Subcutaneous Q8H  . hydrALAZINE  25 mg Oral TID  . insulin aspart  0-15  Units Subcutaneous TID WC  . insulin aspart  0-5 Units Subcutaneous QHS  . labetalol  300 mg Oral Q8H  . lubiprostone  24 mcg Oral BID WC  . magnesium sulfate 1 - 4 g bolus IVPB  2 g Intravenous Once  . milk and molasses   Rectal Once  . OxyCODONE  60 mg Oral BID  . pantoprazole  40 mg Oral Daily  . polyethylene glycol  17 g Oral BID  . potassium chloride  40 mEq Oral Q3H  . senna-docusate  1 tablet Oral BID    Continuous Infusions: . sodium chloride 75 mL/hr at 12/30/12 0536     Levon Hedger MS, RD, LDN [phone] Pager [phone] After Hours Pager

## 2012-12-31 NOTE — Progress Notes (Signed)
CBG 61, glucose gel and cranberry juice give.  Breakfast ordered.  Will recheck CBG in 15 mins.

## 2012-12-31 NOTE — Progress Notes (Signed)
CBG rechecked 84

## 2012-12-31 NOTE — Consult Note (Signed)
Referring Provider: No ref. provider found Primary Care Physician:  Dennison Mascot, MD Primary Gastroenterologist:  Dr. Russella Dar  Reason for Consultation:  Persistent nausea and vomiting  HPI: Lisa Leblanc is a 53 y.o. female with past medical history of narcotic dependence, lumbosacral neuritis, diabetes, and hypertension who presented to [facility] ED 12/27/2012 with complaints of poor oral intake as a result of ongoing nausea and vomiting. Patient has had recent admission within the past one month for constipation, nausea/vomiting, and acute renal failure secondary to dehydration as well.   In ED, blood pressure was 148/107, heart rate was 88 - 118, T max 99.66F, oxygen saturation was 99% on room air. CBC revealed hemoglobin of 10.9 and BMP revealed potassium of 2.8. Abdominal x-ray revealed nonobstructive bowel gas pattern.  Lipase was slightly elevated at 68.  GI was consulted in regards to patient's ongoing symptoms.  She reports that the nausea and vomiting began around the first of September and has been continuous since then.  It is present every day and her symptoms are NOT affected or worsened by eating; she has nausea and vomiting whether she eats or not.  Has no appetite or desire to eat.  Denies any abdominal pain.  Has chronic constipation and while she is here she has been receiving amitiza 24 mcg BID, Miralax BID, and Senokot-S BID.  She had one BM the other day.  Says that there are really no other associated symptoms with her nausea and vomiting.  Never had anything similar to this in the past and was feeling well until this began at the beginning of September.  She is on BID PPI.  Is receiving zofran prn and thinks that it helps slightly.  Potassium continues to be low and need repleted.  There has been no other imaging of her abdomen during this hospital stay.  She had an ultrasound at [facility] in September, which was negative.  She is s/p cholecystectomy.  Lipase is 85 this AM.   EGD 12/09/2012  by Dr. Russella Dar was normal, but due to ongoing symptoms her PPI was increased to BID at that time.   Past Medical History  Diagnosis Date  . Obesity   . Hypertension   . Lumbosacral neuritis   . Chronic pain   . Diabetes mellitus   . Narcotic dependence     Past Surgical History  Procedure Laterality Date  . Anterior cruciate ligament repair Left   . Cholecystectomy  2012    Prior to Admission medications   Medication Sig Start Date End Date Taking? Authorizing Provider  glipiZIDE (GLUCOTROL XL) 5 MG 24 hr tablet Take 5 mg by mouth daily.   Yes Historical Provider, MD  hydrALAZINE (APRESOLINE) 25 MG tablet Take 25 mg by mouth 3 (three) times daily. 11/30/12  Yes Nishant Dhungel, MD  insulin detemir (LEVEMIR) 100 UNIT/ML injection Inject 45 Units into the skin at bedtime.   Yes Historical Provider, MD  omeprazole (PRILOSEC) 20 MG capsule Take 1 capsule (20 mg total) by mouth 2 (two) times daily. 12/07/12  Yes Meryl Dare, MD  oxyCODONE (OXYCONTIN) 20 MG 12 hr tablet Take 60 mg by mouth every 12 (twelve) hours. pain   Yes Historical Provider, MD  senna-docusate (SENOKOT-S) 8.6-50 MG per tablet Take 1 tablet by mouth 2 (two) times daily. 11/24/12  Yes Dorothea Ogle, MD  ondansetron (ZOFRAN) 4 MG tablet Take 1 tablet (4 mg total) by mouth every 8 (eight) hours as needed for nausea. 12/06/12   [place]kra  Aurther Loft, MD    Current Facility-Administered Medications  Medication Dose Route Frequency Provider Last Rate Last Dose  . 0.9 %  sodium chloride infusion   Intravenous Continuous Jerald Kief, MD 75 mL/hr at 12/30/12 0536    . acetaminophen (TYLENOL) tablet 650 mg  650 mg Oral Q6H PRN Jerald Kief, MD   650 mg at 12/29/12 2106   Or  . acetaminophen (TYLENOL) suppository 650 mg  650 mg Rectal Q6H PRN Jerald Kief, MD      . docusate sodium (COLACE) capsule 100 mg  100 mg Oral BID Jerald Kief, MD   100 mg at 12/30/12 2129  . feeding supplement (RESOURCE BREEZE) (RESOURCE BREEZE) liquid 1  Container  1 Container Oral TID BM Lavena Bullion, RD      . feeding supplement (RESOURCE BREEZE) (RESOURCE BREEZE) liquid 1 Container  1 Container Oral PRN Lavena Bullion, RD      . heparin injection 5,000 Units  5,000 Units Subcutaneous Q8H Jerald Kief, MD   5,000 Units at 12/31/12 0603  . hydrALAZINE (APRESOLINE) injection 10 mg  10 mg Intravenous Q6H PRN Alison Murray, MD      . hydrALAZINE (APRESOLINE) tablet 25 mg  25 mg Oral TID Jerald Kief, MD   25 mg at 12/30/12 2129  . HYDROcodone-acetaminophen (HYCET) 7.5-325 mg/15 ml solution 10 mL  10 mL Oral Q6H PRN Alison Murray, MD   10 mL at 12/30/12 0146  . insulin aspart (novoLOG) injection 0-15 Units  0-15 Units Subcutaneous TID WC Jerald Kief, MD   2 Units at 12/28/12 1147  . insulin aspart (novoLOG) injection 0-5 Units  0-5 Units Subcutaneous QHS Jerald Kief, MD      . labetalol (NORMODYNE) tablet 300 mg  300 mg Oral Q8H Alison Murray, MD   300 mg at 12/31/12 1610  . lubiprostone (AMITIZA) capsule 24 mcg  24 mcg Oral BID WC Jerald Kief, MD   24 mcg at 12/30/12 1708  . magnesium sulfate IVPB 2 g 50 mL  2 g Intravenous Once Pamella Pert, MD      . milk and molasses enema   Rectal Once Pamella Pert, MD      . multivitamin with minerals tablet 1 tablet  1 tablet Oral Daily Lavena Bullion, RD      . ondansetron (ZOFRAN-ODT) disintegrating tablet 4 mg  4 mg Oral Q8H PRN Pamella Pert, MD   4 mg at 12/30/12 1332  . oxyCODONE (OXYCONTIN) 12 hr tablet 60 mg  60 mg Oral BID Jerald Kief, MD   60 mg at 12/30/12 2129  . pantoprazole (PROTONIX) EC tablet 40 mg  40 mg Oral Daily Jerald Kief, MD   40 mg at 12/30/12 0939  . polyethylene glycol (MIRALAX / GLYCOLAX) packet 17 g  17 g Oral BID Alison Murray, MD   17 g at 12/30/12 2130  . potassium chloride SA (K-DUR,KLOR-CON) CR tablet 40 mEq  40 mEq Oral Q3H Leda Gauze, NP   40 mEq at 12/31/12 0646  . senna-docusate (Senokot-S) tablet 1 tablet  1 tablet Oral BID Pamella Pert, MD   1 tablet at 12/30/12 2129    Allergies as of 12/27/2012 - Review Complete 12/27/2012  Allergen Reaction Noted  . Ivp dye [iodinated diagnostic agents]  11/26/2011    Family History  Problem Relation Age of Onset  . Diabetes type II Brother   .  Diabetes Mother   . Hypertension Mother   . Diabetes Brother   . Colon cancer Sister   . Colon cancer Maternal Aunt     x 3    History   Social History  . Marital Status: Single    Spouse Name: N/A    Number of Children: 1  . Years of Education: N/A   Occupational History  . 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 Smoking status: Current Every Day Smoker -- 0.50 packs/day for 30 years    Types: Cigarettes  . Smokeless tobacco: Never Used     Comment: SMOKES LESS THAN 1 PK DAILY. Marland KitchenSMOKED 1 PK FOR 2--30 YRS   . Alcohol Use: No  . Drug Use: No  . Sexual Activity: No   Other Topics Concern  . Not on file   Social History Narrative   Lives with her husband and her son.  Works at [employer] center.      Review of Systems: Ten point ROS is O/W negative except as mentioned in HPI.  Physical Exam: Vital signs in last 24 hours: Temp:  [98.2 F (36.8 C)-100 F (37.8 C)] 99 F (37.2 C) (10/10 0600) Pulse Rate:  [83-87] 83 (10/10 0600) Resp:  [18] 18 (10/10 0600) BP: (118-122)/(76-82) 122/77 mmHg (10/10 0600) SpO2:  [98 %-100 %] 100 % (10/10 0600) Last BM Date: 12/28/12 General:   Alert, Well-developed, well-nourished, cooperative in NAD; appears uncomfortable and tired. Head:  Normocephalic and atraumatic. Eyes:  Sclera clear, no icterus.  Conjunctiva pink. Ears:  Normal auditory acuity. Mouth:  No deformity or lesions.   Lungs:  Clear throughout to auscultation.  No wheezes, crackles, or rhonchi.  Heart:  Regular rate and rhythm; no murmurs, clicks, rubs, or gallops. Abdomen:  Soft, obese, non-distended.  BS present.  TTP in the mid-abdomen.  A reducible umbilical  hernia is present Rectal:  Deferred  Msk:  Symmetrical without gross deformities. Pulses:  Normal pulses noted. Extremities:  Without clubbing or edema. Neurologic:  Alert and  oriented x4;  grossly normal neurologically. Skin:  Intact without significant lesions or rashes. Psych:  Alert and cooperative. Depressed mood.  Intake/Output from previous day: 10/09 0701 - 10/10 0700 In: 2065 [P.O.:360; I.V.:1705] Out: -   Lab Results:  Recent Labs  12/30/12 0527 12/31/12 0537  WBC 13.6* 12.3*  HGB 9.3* 9.1*  HCT 26.8* 26.2*  PLT 375 383   BMET  Recent Labs  12/30/12 0527 12/31/12 0537  NA 134* 133*  K 2.6* 2.6*  CL 104 105  CO2 21 20  GLUCOSE 68* 49*  BUN 11 11  CREATININE 0.79 0.75  CALCIUM 7.7* 7.7*   IMPRESSION:  -Persistent nausea and vomiting:  Also, patient does not complain of abdominal pain, but expresses tenderness on exam.  Lipase was minimally elevated, which can occur as an effect of nausea and vomiting.  Not convinced that she has pancreatitis.  She is diabetic and may have gastroparesis, however, she states that nausea and vomiting is continuous, regardless whether she eats or not. -Constipation:  Chronic and likely secondary to narcotics.  Is getting amitiza 24 mcg BID, Miralax BID, and Senokot-S BID. -Persistent hypokalemia:  Secondary to GI losses and poor PO intake. -DM -Narcotic dependence -Anemia:  Normocytic with no evidence of GIB.  Hgb down 2 grams since admission.   PLAN: -Start with CT scan of the abdomen and pelvis with contrast. -If  CT is negative then consider GES, but patient needs to be able to eat and tolerate the contents for the study. -Made zofran 4 mg ODT ATC instead of prn. -Replete K+ prn. -Agree with bowel regimen. -Monitor Hgb and for any signs of GIB.  *Will need eventual outpatient screening colonoscopy once acute issues have resolved.  ZEHR, JESSICA D.  12/31/2012, 10:46 AM  Pager number [phone]  Chart was reviewed  and patient was examined. X-rays and lab were reviewed.    I agree with management and plans.  Symptoms of chronic nausea with vomiting are suggestive of a medication effect.  Patient is on chronic narcotics which potentially could be a culprit.  Gastroparesis is a possibility although she is sure to have delayed gastric emptying because of narcotics.  No microcytic anemia.   Agree with plans for CT scan.  If negative I would obtain a gastric emptying scan.  Check stool Hemoccults  Bonner Larue D. Arlyce Dice, M.D., [facility] Cell [phone]

## 2013-01-01 ENCOUNTER — Inpatient Hospital Stay (HOSPITAL_COMMUNITY): Payer: BC Managed Care – PPO

## 2013-01-01 DIAGNOSIS — E871 Hypo-osmolality and hyponatremia: Secondary | ICD-10-CM

## 2013-01-01 DIAGNOSIS — R933 Abnormal findings on diagnostic imaging of other parts of digestive tract: Secondary | ICD-10-CM

## 2013-01-01 DIAGNOSIS — K5289 Other specified noninfective gastroenteritis and colitis: Principal | ICD-10-CM

## 2013-01-01 LAB — CORTISOL: Cortisol, Plasma: 27.3 ug/dL

## 2013-01-01 LAB — BASIC METABOLIC PANEL
BUN: 13 mg/dL (ref 6–23)
CO2: 16 mEq/L — ABNORMAL LOW (ref 19–32)
Calcium: 8.5 mg/dL (ref 8.4–10.5)
Chloride: 101 mEq/L (ref 96–112)
Creatinine, Ser: 0.91 mg/dL (ref 0.50–1.10)
GFR calc Af Amer: 82 mL/min — ABNORMAL LOW (ref >90)
GFR calc non Af Amer: 71 mL/min — ABNORMAL LOW (ref >90)
Glucose, Bld: 176 mg/dL — ABNORMAL HIGH (ref 70–99)
Potassium: 4.1 mEq/L (ref 3.5–5.1)
Sodium: 127 mEq/L — ABNORMAL LOW (ref 135–145)

## 2013-01-01 LAB — CBC
Hemoglobin: 9.4 g/dL — ABNORMAL LOW (ref 12.0–15.0)
RBC: 3.46 MIL/uL — ABNORMAL LOW (ref 3.87–5.11)
WBC: 10.7 10*3/uL — ABNORMAL HIGH (ref 4.0–10.5)

## 2013-01-01 LAB — GLUCOSE, CAPILLARY
Glucose-Capillary: 189 mg/dL — ABNORMAL HIGH (ref 70–99)
Glucose-Capillary: 182 mg/dL — ABNORMAL HIGH (ref 70–99)

## 2013-01-01 MED ORDER — INSULIN DETEMIR 100 UNIT/ML ~~LOC~~ SOLN: 15.0000 [IU] | Freq: Every day | SUBCUTANEOUS | Status: DC

## 2013-01-01 MED ORDER — IOHEXOL 300 MG/ML  SOLN
100.0000 mL | Freq: Once | INTRAMUSCULAR | Status: AC | PRN
  Administered 2013-01-01: 100 mL via INTRAVENOUS

## 2013-01-01 MED ORDER — IOHEXOL 300 MG/ML  SOLN
50.0000 mL | Freq: Once | INTRAMUSCULAR | Status: AC | PRN
  Administered 2013-01-01: 50 mL via ORAL

## 2013-01-01 MED ORDER — PREDNISONE 10 MG PO TABS: 10.0000 mg | ORAL_TABLET | Freq: Every day | ORAL | Status: DC

## 2013-01-01 MED ORDER — ONDANSETRON HCL 4 MG PO TABS: 4.0000 mg | ORAL_TABLET | Freq: Three times a day (TID) | ORAL | Status: DC | PRN

## 2013-01-01 NOTE — Discharge Summary (Signed)
Physician Discharge Summary  Lisa Leblanc:[id number] DOB: May 04, 1960 DOA: 12/27/2012  PCP: Dennison Mascot, MD  Admit date: 12/27/2012 Discharge date: 01/02/2013  Time spent: 45 minutes  Recommendations for Outpatient Follow-up:  1. Follow up with your PCP early next week 2. Follow up with GI in 1 week   Recommendations for primary care physician for things to follow:  - repeat BMP  Discharge Diagnoses:  Principal Problem:   Nausea and vomiting Active Problems:   Hypertension   Lumbosacral neuritis   Diabetes mellitus   Narcotic dependence   Hypokalemia   Constipation   Pancreatitis, acute   Protein-calorie malnutrition, severe   Nonspecific (abnormal) findings on radiological and other examination of gastrointestinal tract   Hyponatremia   Other and unspecified noninfectious gastroenteritis and colitis(558.9)  Discharge Condition: stable  Diet recommendation: heart healthy, diabetic  Filed Weights   12/27/12 2339  Weight: 75.887 kg (167 lb 4.8 oz)   History of present illness:  52 y.o. female With a hx of narcotic dependence who presents to the ED with decreased PO intake associated with n/v. Was recently admitted 1 mos ago for ARF associated with dehydration and constipation. In the ED, pt was unable to tolerate PO trial at the bedside. Hospitalist was consulted for admission. On further questioning, pt reports no BM x 1 week.  Hospital Course:  Abdominal pain, nausea and vomiting - her pancreatitis alone was not fully explaining her symptoms especially since they have been going on for a month and patient continued to have nausea and vomiting while hospitalized. Gastroenterology has been consulted on 10/10. Patient underwent a CT scan; in preparation for the CT scan she underwent pre-medication with Prednisone and Benadryl given her allergy to the IV contrast. Her abdominal pain, nausea and vomiting significantly improved with steroids. An am cortisol level was  able to be added on to the previous blood collection prior to administration of prednisone, and it showed a value of 27.3 at 10:30 am on 12/28/2012 thus making adrenal insufficiency less likely. Her etiology does point to a non specified GI inflammatory condition. Patient insisted that she is discharged today since she is feeling much better and able to eat and drink well without any symptoms. Her birthday is tomorrow. She was given a steroids taper and was advised to have close follow up with her PCP and to call his office on Monday morning. She expressed understanding. She will also have close follow up with Gastroenterology for further evaluation of her CT findings of colitis (see below) Hypokalemia- Secondary to GI losses, resolved once patient was able to have adequate po intake.  Hyponatremia - advised to follow up with her PCP office on Monday for repeat blood work.  Constipation- Bowel regimen. Continue previous regimen on discharge.  Hypertension- Continue previous regimen on discharge. Advised PCP follow up.  Lumbosacral neuritis- Continue OxyContin 60 mg by mouth twice a day  Diabetes mellitus- Hemoglobin A1c 6.7 in August 2014 indicating good glycemic control   Procedures:  none   Consultations:  GI  Discharge Exam: Filed Vitals:   12/31/12 0600 12/31/12 1408 12/31/12 2235 01/01/13 0550  BP: 122/77 101/64 123/82 141/100  Pulse: 83 85 87 85  Temp: 99 F (37.2 C) 99.8 F (37.7 C) 98.8 F (37.1 C) 98.7 F (37.1 C)  TempSrc: Oral Oral Oral Oral  Resp: 18 16 17 18   Height:      Weight:      SpO2: 100% 100% 96% 94%   General: NAD Cardiovascular:  RRR Respiratory: CTA biL  Discharge Instructions       Future Appointments Provider Department Dept Phone   02/14/2013 11:00 AM [facility] Previsit [facility] [phone]   02/28/2013 11:00 AM Meryl Dare, MD [facility] [phone]       Medication List          glipiZIDE 5 MG 24 hr tablet  Commonly known as:  GLUCOTROL XL  Take 5 mg by mouth daily.     hydrALAZINE 25 MG tablet  Commonly known as:  APRESOLINE  Take 25 mg by mouth 3 (three) times daily.     insulin detemir 100 UNIT/ML injection  Commonly known as:  LEVEMIR  Inject 0.15 mLs (15 Units total) into the skin at bedtime.     omeprazole 20 MG capsule  Commonly known as:  PRILOSEC  Take 1 capsule (20 mg total) by mouth 2 (two) times daily.     ondansetron 4 MG tablet  Commonly known as:  ZOFRAN  Take 1 tablet (4 mg total) by mouth every 8 (eight) hours as needed for nausea.     oxyCODONE 20 MG 12 hr tablet  Commonly known as:  OXYCONTIN  Take 60 mg by mouth every 12 (twelve) hours. pain     predniSONE 10 MG tablet  Commonly known as:  DELTASONE  Take 1 tablet (10 mg total) by mouth daily. 4 tablets daily for 2 days then 3 tablets daily for 2 days then 2 tablets daily for 2 days then 1 tablet daily for 2 days then stop.     senna-docusate 8.6-50 MG per tablet  Commonly known as:  Senokot-S  Take 1 tablet by mouth 2 (two) times daily.       Follow-up Information   Follow up with MORRISEY,LEMONT, MD. Schedule an appointment as soon as possible for a visit in 3 days.   Specialty:  Family Medicine   Contact information:   [facility] [address] [place] [address] [phone]       Follow up with Judie Petit T. Russella Dar, MD. Schedule an appointment as soon as possible for a visit in 1 week.   Specialty:  Gastroenterology   Contact information:   [address] [place] [address] [phone]       The results of significant diagnostics from this hospitalization (including imaging, microbiology, ancillary and laboratory) are listed below for reference.    Significant Diagnostic Studies: Ct Abdomen Pelvis W Contrast  01/01/2013   CLINICAL DATA:  Persistent nausea. History of significant contrast allergy, pre-medicated for today's  examination.  EXAM: CT ABDOMEN AND PELVIS WITH CONTRAST  TECHNIQUE: Multidetector CT imaging of the abdomen and pelvis was performed using the standard protocol following bolus administration of intravenous contrast.  CONTRAST:  50mL OMNIPAQUE IOHEXOL 300 MG/ML SOLN, OMNIPAQUE IOHEXOL 300 MG/ML SOLN  COMPARISON:  Radiographs dated 12/27/2012.  FINDINGS: Mildly enlarged heart. Diffuse low density of the liver relative to the spleen. Diffuse wall thickening involving the descending colon and less pronounced diffuse wall thickening involving the sigmoid colon and rectum. No wall thickening involving the ascending or transverse colon. Mild pericolonic soft tissue stranding in the areas of wall thickening. Scattered diverticula in the region. No extraluminal gas and no fluid collections.  Umbilical hernia containing a herniated loop of small bowel without obstruction or wall thickening. Normal appearing appendix. Minimal free peritoneal fluid.  Normal appearing spleen, pancreas, adrenal glands, kidneys, urinary bladder, uterus and  ovaries. No enlarged lymph nodes. Surgically absent gallbladder.  IMPRESSION: 1. Descending and sigmoid colon colitis and proctitis. This is most likely infectious or inflammatory in nature. Ischemia is less likely. 2. Mild colonic diverticulosis. 3. Diffuse hepatic steatosis. 4. Minimal free peritoneal fluid. 5. Umbilical hernia containing a herniated loop of small bowel without obstruction.   Electronically Signed   By: Gordan Payment M.D.   On: 01/01/2013 02:29   Dg Abd Acute W/chest  12/27/2012   *RADIOLOGY REPORT*  Clinical Data: Abdominal pain  ACUTE ABDOMEN SERIES (ABDOMEN 2 VIEW & CHEST 1 VIEW)  Comparison: Prior abdominal radiographs 11/20/2012  Findings: Stable cardiomegaly.  There is trace atherosclerotic calcification of the transverse aorta.  Interval development of a subpleural density along the lateral wall of the left thorax which measures approximately 2 cm in length.   Mild central bronchitic changes and prominence of interstitial markings similar to previous studies. No pleural effusion or pneumothorax.  Unremarkable, nonobstructed bowel gas pattern.  Gas and stool noted throughout the colon to the level of the rectum.  No dilatation. No large free air.  Healed right superior and inferior pubic rami fractures.  No acute osseous abnormality.  IMPRESSION:  1.  No acute cardiopulmonary process. 2.  Unremarkable, nonobstructed bowel gas pattern. 3.  Subpleural density along the lateral aspect of the left chest wall is nonspecific and may represent callus around a healing lateral rib fracture, or a subpleural nodule/mass.  If there is no clinical history of prior left-sided rib fracture, consider further evaluation with CT scan of the chest to further evaluate.   Original Report Authenticated By: Malachy Moan, M.D.    Microbiology: Recent Results (from the past 240 hour(s))  URINE CULTURE     Status: None   Collection Time    12/27/12 10:33 PM      Result Value Range Status   Specimen Description URINE, CLEAN CATCH   Final   Special Requests NONE   Final   Culture  Setup Time     Final   Value: 12/28/2012 07:38     Performed at [facility] Count     Final   Value: 70,000 COLONIES/ML     Performed at [facility]   Culture     Final   Value: DIPHTHEROIDS(CORYNEBACTERIUM SPECIES)     Note: Standardized susceptibility testing for this organism is not available.     Performed at [facility]   Report Status 12/29/2012 FINAL   Final     Labs: Basic Metabolic Panel:  Recent Labs Lab 12/27/12 1940 12/28/12 0558 12/30/12 0527 12/31/12 0537 12/31/12 1951 01/01/13 0612  NA 134* 134* 134* 133*  --  127*  K 2.9* 3.5 2.6* 2.6* 3.8 4.1  CL 95* 99 104 105  --  101  CO2 24 21 21 20   --  16*  GLUCOSE 87 105* 68* 49*  --  176*  BUN 8 7 11 11   --  13  CREATININE 0.75 0.69 0.79 0.75  --  0.91  CALCIUM 8.7 8.5 7.7* 7.7*  --   8.5  MG  --   --  1.3* 1.7  --   --   PHOS  --   --  2.3  --   --   --    Liver Function Tests:  Recent Labs Lab 12/27/12 1541 12/27/12 1940 12/28/12 0558  AST 48* 43* 53*  ALT 17 17 18   ALKPHOS 91 86 88  BILITOT  0.5 0.5 0.3  PROT 6.9 6.6 6.4  ALBUMIN 2.8* 2.6* 2.5*    Recent Labs Lab 12/27/12 1541 12/27/12 1940 12/31/12 0537  LIPASE 68* 68* 85*   CBC:  Recent Labs Lab 12/27/12 1541 12/27/12 1940 12/28/12 0558 12/30/12 0527 12/31/12 0537 01/01/13 0612  WBC 4.9 5.4 7.4 13.6* 12.3* 10.7*  NEUTROABS 3.1 3.4  --   --   --   --   HGB 10.9* 11.0* 11.1* 9.3* 9.1* 9.4*  HCT 32.1* 32.3* 33.1* 26.8* 26.2* 27.8*  MCV 81.5 81.6 81.5 80.7 80.1 80.3  PLT 416* 426* 458* 375 383 417*   CBG:  Recent Labs Lab 12/31/12 1202 12/31/12 1619 12/31/12 2106 01/01/13 0716 01/01/13 1130  GLUCAP 80 98 159* 189* 182*   Signed: Zaydn Gutridge  [facility] 01/02/2013, 11:24 AM

## 2013-01-01 NOTE — Progress Notes (Signed)
Patient discharged home in stable condition.  No change from morning assessment.  Discharge instructions and scripts provided to pt with verbal feedback and understanding.  No questions at this time.  MyChart declined.

## 2013-01-01 NOTE — Progress Notes (Signed)
Progress Note   Subjective  *Since receiving prednisone yesterday nausea and vomiting have entirely subsided.  **   Objective  Vital signs in last 24 hours: Temp:  [98.7 F (37.1 C)-99.8 F (37.7 C)] 98.7 F (37.1 C) (10/11 0550) Pulse Rate:  [85-87] 85 (10/11 0550) Resp:  [16-18] 18 (10/11 0550) BP: (101-141)/(64-100) 141/100 mmHg (10/11 0550) SpO2:  [94 %-100 %] 94 % (10/11 0550) Last BM Date: 12/31/12 (per patient) General:   Alert,  Well-developed,  white female in NAD   Intake/Output from previous day: 10/10 0701 - 10/11 0700 In: 1725 [I.V.:1725] Out: -  Intake/Output this shift:    Lab Results:  Recent Labs  12/30/12 0527 12/31/12 0537 01/01/13 0612  WBC 13.6* 12.3* 10.7*  HGB 9.3* 9.1* 9.4*  HCT 26.8* 26.2* 27.8*  PLT 375 383 417*   BMET  Recent Labs  12/30/12 0527 12/31/12 0537 12/31/12 1951 01/01/13 0612  NA 134* 133*  --  127*  K 2.6* 2.6* 3.8 4.1  CL 104 105  --  101  CO2 21 20  --  16*  GLUCOSE 68* 49*  --  176*  BUN 11 11  --  13  CREATININE 0.79 0.75  --  0.91  CALCIUM 7.7* 7.7*  --  8.5   LFT No results found for this basename: PROT, ALBUMIN, AST, ALT, ALKPHOS, BILITOT, BILIDIR, IBILI,  in the last 72 hours PT/INR No results found for this basename: LABPROT, INR,  in the last 72 hours Hepatitis Panel No results found for this basename: HEPBSAG, HCVAB, HEPAIGM, HEPBIGM,  in the last 72 hours  Studies/Results: Ct Abdomen Pelvis W Contrast  01/01/2013   CLINICAL DATA:  Persistent nausea. History of significant contrast allergy, pre-medicated for today's examination.  EXAM: CT ABDOMEN AND PELVIS WITH CONTRAST  TECHNIQUE: Multidetector CT imaging of the abdomen and pelvis was performed using the standard protocol following bolus administration of intravenous contrast.  CONTRAST:  50mL OMNIPAQUE IOHEXOL 300 MG/ML SOLN, OMNIPAQUE IOHEXOL 300 MG/ML SOLN  COMPARISON:  Radiographs dated 12/27/2012.  FINDINGS: Mildly enlarged heart.  Diffuse low density of the liver relative to the spleen. Diffuse wall thickening involving the descending colon and less pronounced diffuse wall thickening involving the sigmoid colon and rectum. No wall thickening involving the ascending or transverse colon. Mild pericolonic soft tissue stranding in the areas of wall thickening. Scattered diverticula in the region. No extraluminal gas and no fluid collections.  Umbilical hernia containing a herniated loop of small bowel without obstruction or wall thickening. Normal appearing appendix. Minimal free peritoneal fluid.  Normal appearing spleen, pancreas, adrenal glands, kidneys, urinary bladder, uterus and ovaries. No enlarged lymph nodes. Surgically absent gallbladder.  IMPRESSION: 1. Descending and sigmoid colon colitis and proctitis. This is most likely infectious or inflammatory in nature. Ischemia is less likely. 2. Mild colonic diverticulosis. 3. Diffuse hepatic steatosis. 4. Minimal free peritoneal fluid. 5. Umbilical hernia containing a herniated loop of small bowel without obstruction.   Electronically Signed   By: Gordan Payment M.D.   On: 01/01/2013 02:29      Assessment & Plan  Pt clearly is markedly improved since starting steroids.  I'm skeptical of the CT findings of colitis since pt has no symptoms referable to her colon.  This may simply be an underdistended colon.  Nonetheless, she will need a colonoscopy which will be arranged as an outpt.  OK to d/c on tapering dose of prednisone. ** Principal Problem:   Nausea and  vomiting Active Problems:   Hypertension   Lumbosacral neuritis   Diabetes mellitus   Narcotic dependence   Hypokalemia   Constipation   Pancreatitis, acute   Protein-calorie malnutrition, severe     LOS: 5 days   Melvia Heaps  01/01/2013, 9:39 AM

## 2013-01-03 ENCOUNTER — Telehealth: Payer: Self-pay

## 2013-01-03 NOTE — Telephone Encounter (Signed)
Message copied by Annett Fabian on Mon Jan 03, 2013 11:50 AM ------      Message from: Melvia Heaps D      Created: Sat Jan 01, 2013  9:42 AM       Lisa Leblanc,       This pt was seen by Dr. Russella Dar in the hospital.  She needs a GI followup .  CT scan showed left sided colitis (admitted for nausea and vomiting) though there is no clinical correlation.  She will need colonoscopy.            RK ------

## 2013-01-03 NOTE — Telephone Encounter (Signed)
Patient is already scheduled for colonoscopy for 02/28/13

## 2013-01-03 NOTE — Telephone Encounter (Signed)
No answer/machine I will continue to try and reach the patient  

## 2013-02-28 ENCOUNTER — Encounter: Payer: BC Managed Care – PPO | Admitting: Gastroenterology

## 2013-03-30 ENCOUNTER — Ambulatory Visit: Payer: Self-pay | Admitting: Family Medicine

## 2013-04-14 ENCOUNTER — Telehealth: Payer: Self-pay | Admitting: *Deleted

## 2013-04-14 NOTE — Telephone Encounter (Signed)
Patient no show for previsit appointment today. LM for patient to call and reschedule previsit before 5 pm today in order to keep colonoscopy appt scheduled for 04/26/13. If appt not rescheduled by 5pm today then appointment for colonoscopy will be cancelled and both previsit and colonoscopy will need to be rescheduled.

## 2013-04-20 ENCOUNTER — Ambulatory Visit (AMBULATORY_SURGERY_CENTER): Payer: BC Managed Care – PPO | Admitting: *Deleted

## 2013-04-20 ENCOUNTER — Telehealth: Payer: Self-pay | Admitting: *Deleted

## 2013-04-20 ENCOUNTER — Ambulatory Visit: Payer: Self-pay | Admitting: Chiropractor

## 2013-04-20 VITALS — Ht 61.0 in | Wt 180.0 lb

## 2013-04-20 DIAGNOSIS — Z1211 Encounter for screening for malignant neoplasm of colon: Secondary | ICD-10-CM

## 2013-04-20 DIAGNOSIS — Z800 Family history of malignant neoplasm of digestive organs: Secondary | ICD-10-CM

## 2013-04-20 MED ORDER — PEG-KCL-NACL-NASULF-NA ASC-C 100 G PO SOLR: ORAL | Status: DC

## 2013-04-20 NOTE — Telephone Encounter (Signed)
I spoke with Lisa Leblanc and instructed her re: Dr. Ardell Isaacs recommendations.  Understanding voiced

## 2013-04-20 NOTE — Telephone Encounter (Signed)
Yes a double prep. Also use an OTC laxative daily for 3 days prior to starting prep.

## 2013-04-20 NOTE — Progress Notes (Signed)
Pt states that it is "normal for me not to have a BM for 3 weeks."  She states she has always been that way. I put her on a double prep and sent a note to Dr. Russella Dar to make him aware and see if there was anything else he wanted done.  No egg or soy allergy

## 2013-04-20 NOTE — Telephone Encounter (Signed)
Dr. Russella Dar, This pt has her PV today for her colonoscopy on 04-26-13.  She states it is normal for her not have a BM for 3 weeks and I noted you mentioned her chronic constipation with her OV last September.  I put her on a double prep for her procedure.  Is there anything else you want her to do for her prep?  Thanks, [employer]

## 2013-04-25 ENCOUNTER — Telehealth: Payer: Self-pay | Admitting: Gastroenterology

## 2013-04-25 ENCOUNTER — Encounter: Payer: Self-pay | Admitting: Gastroenterology

## 2013-04-25 NOTE — Telephone Encounter (Signed)
Spoke with patient, then called pharmacy. Marga Melnick pharmacist the free coupon information over the phone. He states he will fill this. Patient instructed to pick up Moviprep today and to take this as per instructions. Patient states she did not take the miralax yesterday,she has already 'taken the dulcolax pills and they are working". Encouraged patient to call us back if the Moviprep does not work, she understands.

## 2013-04-26 ENCOUNTER — Encounter: Payer: Self-pay | Admitting: Gastroenterology

## 2013-04-26 ENCOUNTER — Ambulatory Visit: Payer: BC Managed Care – PPO | Admitting: Gastroenterology

## 2013-04-26 VITALS — BP 166/121 | HR 97 | Temp 96.7°F | Ht 61.0 in | Wt 180.0 lb

## 2013-04-26 LAB — GLUCOSE, CAPILLARY: GLUCOSE-CAPILLARY: 109 mg/dL — AB (ref 70–99)

## 2013-04-26 MED ORDER — SODIUM CHLORIDE 0.9 % IV SOLN: 500.0000 mL | INTRAVENOUS | Status: DC

## 2013-04-26 NOTE — Progress Notes (Signed)
Alphonzo Dublin, RN relayed elevated blood pressure on pt 174/123 and 166/121 to Cathlyn Parsons, CRNA.  He said he will review this. Maw

## 2013-04-26 NOTE — Progress Notes (Signed)
Per Dr. Russella Dar, pt is to be rescheduled with a more extensive prep.  I rescheduled the pt to 06-16-13 at 8:00 and gave me more extensive more instructions. See letters for instructions  I also told her that if she does start taking her diabetic meds, please call us to give her instructions  I stressed the importance of following prep instructions exactly

## 2013-04-29 ENCOUNTER — Emergency Department: Payer: Self-pay | Admitting: Emergency Medicine

## 2013-04-29 LAB — COMPREHENSIVE METABOLIC PANEL
AST: 29 U/L (ref 15–37)
Albumin: 3.6 g/dL (ref 3.4–5.0)
Alkaline Phosphatase: 112 U/L
Anion Gap: 2 — ABNORMAL LOW (ref 7–16)
BILIRUBIN TOTAL: 0.4 mg/dL (ref 0.2–1.0)
BUN: 20 mg/dL — AB (ref 7–18)
CALCIUM: 8.9 mg/dL (ref 8.5–10.1)
Chloride: 107 mmol/L (ref 98–107)
Co2: 30 mmol/L (ref 21–32)
Creatinine: 1.21 mg/dL (ref 0.60–1.30)
GFR CALC AF AMER: 59 — AB
GFR CALC NON AF AMER: 51 — AB
Glucose: 93 mg/dL (ref 65–99)
OSMOLALITY: 280 (ref 275–301)
POTASSIUM: 3.5 mmol/L (ref 3.5–5.1)
SGPT (ALT): 27 U/L (ref 12–78)
Sodium: 139 mmol/L (ref 136–145)
Total Protein: 7.5 g/dL (ref 6.4–8.2)

## 2013-04-29 LAB — URINALYSIS, COMPLETE
Bilirubin,UR: NEGATIVE
Blood: NEGATIVE
GLUCOSE, UR: NEGATIVE mg/dL (ref 0–75)
KETONE: NEGATIVE
Leukocyte Esterase: NEGATIVE
NITRITE: NEGATIVE
PROTEIN: NEGATIVE
Ph: 6 (ref 4.5–8.0)
SPECIFIC GRAVITY: 1.015 (ref 1.003–1.030)

## 2013-04-29 LAB — TROPONIN I: Troponin-I: 0.05 ng/mL

## 2013-04-29 LAB — CBC
HCT: 35.2 % (ref 35.0–47.0)
HGB: 11.4 g/dL — AB (ref 12.0–16.0)
MCH: 29.2 pg (ref 26.0–34.0)
MCHC: 32.4 g/dL (ref 32.0–36.0)
MCV: 90 fL (ref 80–100)
PLATELETS: 378 10*3/uL (ref 150–440)
RBC: 3.9 10*6/uL (ref 3.80–5.20)
RDW: 16.1 % — ABNORMAL HIGH (ref 11.5–14.5)
WBC: 10.3 10*3/uL (ref 3.6–11.0)

## 2013-04-29 LAB — CK-MB: CK-MB: 3.8 ng/mL — ABNORMAL HIGH (ref 0.5–3.6)

## 2013-04-29 LAB — WET PREP, GENITAL

## 2013-04-30 LAB — GC/CHLAMYDIA PROBE AMP

## 2013-05-05 ENCOUNTER — Ambulatory Visit: Payer: Self-pay | Admitting: Orthopedic Surgery

## 2013-05-18 LAB — PATHOLOGY REPORT

## 2013-05-29 ENCOUNTER — Inpatient Hospital Stay: Payer: Self-pay | Admitting: Internal Medicine

## 2013-05-29 LAB — CK TOTAL AND CKMB (NOT AT ARMC)
CK, Total: 99 U/L
CK, Total: 87 U/L
CK-MB: 3.7 ng/mL — AB (ref 0.5–3.6)
CK-MB: 2.9 ng/mL (ref 0.5–3.6)

## 2013-05-29 LAB — BASIC METABOLIC PANEL
Anion Gap: 4 — ABNORMAL LOW (ref 7–16)
BUN: 16 mg/dL (ref 7–18)
CO2: 28 mmol/L (ref 21–32)
Calcium, Total: 8.6 mg/dL (ref 8.5–10.1)
Chloride: 106 mmol/L (ref 98–107)
Creatinine: 1.24 mg/dL (ref 0.60–1.30)
EGFR (African American): 57 — ABNORMAL LOW
GFR CALC NON AF AMER: 49 — AB
Glucose: 97 mg/dL (ref 65–99)
Osmolality: 277 (ref 275–301)
POTASSIUM: 3.4 mmol/L — AB (ref 3.5–5.1)
Sodium: 138 mmol/L (ref 136–145)

## 2013-05-29 LAB — TROPONIN I
TROPONIN-I: 0.1 ng/mL — AB
Troponin-I: 0.08 ng/mL — ABNORMAL HIGH
Troponin-I: 0.1 ng/mL — ABNORMAL HIGH

## 2013-05-29 LAB — CBC
HCT: 32.9 % — AB (ref 35.0–47.0)
HGB: 10.3 g/dL — AB (ref 12.0–16.0)
MCH: 27.6 pg (ref 26.0–34.0)
MCHC: 31.3 g/dL — ABNORMAL LOW (ref 32.0–36.0)
MCV: 88 fL (ref 80–100)
Platelet: 397 10*3/uL (ref 150–440)
RBC: 3.72 10*6/uL — ABNORMAL LOW (ref 3.80–5.20)
RDW: 16.9 % — AB (ref 11.5–14.5)
WBC: 10.9 10*3/uL (ref 3.6–11.0)

## 2013-05-29 LAB — CK-MB: CK-MB: 3.2 ng/mL (ref 0.5–3.6)

## 2013-05-29 LAB — PRO B NATRIURETIC PEPTIDE: B-Type Natriuretic Peptide: 17647 pg/mL — ABNORMAL HIGH (ref 0–125)

## 2013-05-30 DIAGNOSIS — I059 Rheumatic mitral valve disease, unspecified: Secondary | ICD-10-CM

## 2013-05-30 LAB — CBC WITH DIFFERENTIAL/PLATELET
BASOS PCT: 0.2 %
Basophil #: 0 10*3/uL (ref 0.0–0.1)
Eosinophil #: 0 10*3/uL (ref 0.0–0.7)
Eosinophil %: 0.3 %
HCT: 32.4 % — ABNORMAL LOW (ref 35.0–47.0)
HGB: 11 g/dL — ABNORMAL LOW (ref 12.0–16.0)
LYMPHS ABS: 1 10*3/uL (ref 1.0–3.6)
LYMPHS PCT: 10 %
MCH: 29.7 pg (ref 26.0–34.0)
MCHC: 33.9 g/dL (ref 32.0–36.0)
MCV: 88 fL (ref 80–100)
Monocyte #: 0.7 x10 3/mm (ref 0.2–0.9)
Monocyte %: 6.9 %
Neutrophil #: 8.1 10*3/uL — ABNORMAL HIGH (ref 1.4–6.5)
Neutrophil %: 82.6 %
PLATELETS: 382 10*3/uL (ref 150–440)
RBC: 3.69 10*6/uL — ABNORMAL LOW (ref 3.80–5.20)
RDW: 16.8 % — ABNORMAL HIGH (ref 11.5–14.5)
WBC: 9.8 10*3/uL (ref 3.6–11.0)

## 2013-05-30 LAB — BASIC METABOLIC PANEL
ANION GAP: 4 — AB (ref 7–16)
BUN: 19 mg/dL — ABNORMAL HIGH (ref 7–18)
CALCIUM: 8.8 mg/dL (ref 8.5–10.1)
CHLORIDE: 101 mmol/L (ref 98–107)
CO2: 32 mmol/L (ref 21–32)
CREATININE: 1.57 mg/dL — AB (ref 0.60–1.30)
EGFR (Non-African Amer.): 37 — ABNORMAL LOW
GFR CALC AF AMER: 43 — AB
GLUCOSE: 144 mg/dL — AB (ref 65–99)
Osmolality: 279 (ref 275–301)
Potassium: 3.4 mmol/L — ABNORMAL LOW (ref 3.5–5.1)
Sodium: 137 mmol/L (ref 136–145)

## 2013-05-30 LAB — MAGNESIUM: Magnesium: 1.9 mg/dL

## 2013-06-02 ENCOUNTER — Telehealth: Payer: Self-pay

## 2013-06-02 NOTE — Telephone Encounter (Signed)
Attempted to contact pt regarding discharge from [facility] 05/30/13. Home and mobile #s, mailbox full and not accepting messages. Work # disconnected. Mailed letter to pt.

## 2013-06-07 ENCOUNTER — Encounter: Payer: BC Managed Care – PPO | Admitting: Cardiovascular Disease

## 2013-06-07 ENCOUNTER — Telehealth: Payer: Self-pay

## 2013-06-07 NOTE — Telephone Encounter (Signed)
Patient contacted regarding discharge from [facility] on 05/30/13.  Patient understands to follow up with Dr. Mariah Milling on 06/10/13 at 9:15 at [facility]. Patient understands discharge instructions? yes Patient understands medications and regiment? yes Patient understands to bring all medications to this visit? yes  Pt states that her legs are continuing to swell.  Reports that she does not take her lasix every day, does not weigh herself daily and has not been wearing her compression hose. Advised pt to follow the instructions on her discharge instructions and reviewed these w/ pt.  Discussed w/ pt the importance of taking her lasix, daily wts and to wear her compression hose as advised. She verbalizes understanding and is looking forward to her appt w/ Dr. Mariah Milling on 3/20.

## 2013-06-10 ENCOUNTER — Encounter: Payer: Self-pay | Admitting: Cardiovascular Disease

## 2013-06-10 ENCOUNTER — Ambulatory Visit (INDEPENDENT_AMBULATORY_CARE_PROVIDER_SITE_OTHER): Payer: BC Managed Care – PPO | Admitting: Cardiovascular Disease

## 2013-06-10 VITALS — BP 160/123 | HR 99 | Ht 64.0 in | Wt 180.8 lb

## 2013-06-10 DIAGNOSIS — R0789 Other chest pain: Secondary | ICD-10-CM

## 2013-06-10 DIAGNOSIS — I10 Essential (primary) hypertension: Secondary | ICD-10-CM

## 2013-06-10 DIAGNOSIS — M549 Dorsalgia, unspecified: Secondary | ICD-10-CM

## 2013-06-10 DIAGNOSIS — R0602 Shortness of breath: Secondary | ICD-10-CM

## 2013-06-10 DIAGNOSIS — E119 Type 2 diabetes mellitus without complications: Secondary | ICD-10-CM

## 2013-06-10 MED ORDER — HYDRALAZINE HCL 100 MG PO TABS: 100.0000 mg | ORAL_TABLET | Freq: Three times a day (TID) | ORAL | Status: DC

## 2013-06-10 MED ORDER — LABETALOL HCL 300 MG PO TABS: 300.0000 mg | ORAL_TABLET | Freq: Three times a day (TID) | ORAL | Status: DC

## 2013-06-10 MED ORDER — FUROSEMIDE 40 MG PO TABS: 40.0000 mg | ORAL_TABLET | Freq: Two times a day (BID) | ORAL | Status: DC | PRN

## 2013-06-10 MED ORDER — POTASSIUM CHLORIDE ER 10 MEQ PO TBCR: 10.0000 meq | EXTENDED_RELEASE_TABLET | Freq: Two times a day (BID) | ORAL | Status: DC | PRN

## 2013-06-10 NOTE — Patient Instructions (Addendum)
Please increase the hydralazine to 100 mg three times a day Please increase the labetolol to 300 mg three times a day Ok to cut the pills in 1/2 for low pressures or dizziness  Please call us if you have new issues that need to be addressed before your next appt.  Your physician wants you to follow-up in: 1 month.

## 2013-06-10 NOTE — Assessment & Plan Note (Addendum)
Blood pressure continues to be very elevated both in the hospital recently and on today's visit. She reports not feeling well since her discharge. In an effort to control her blood pressure, we have suggested she increase her hydralazine from 25 mg 3 times a day up to 50 mg 3 times a day, even 100 mg 3 times a day as needed for blood pressure control. She does have a blood pressure cuff at home. Heart rate is also elevated and we'll increase her labetalol up to 300 mg 3 times a day up from 100 mg 3 times a day. She will closely monitor her blood pressure and call our office with numbers. If blood pressure continues to run high, we would start isosorbide mononitrate. Uncertain if bidil would be expensive for her but this would be another optionl

## 2013-06-10 NOTE — Assessment & Plan Note (Signed)
She reports having severe spasm he back pain radiating around to her front on today's visit. Difficulty moving from chair to table. She will try hot pack, NSAIDs, reports that she has a pain pill

## 2013-06-10 NOTE — Assessment & Plan Note (Signed)
We have encouraged continued exercise, careful diet management in an effort to lose weight. 

## 2013-06-10 NOTE — Assessment & Plan Note (Signed)
Likely multifactorial including poorly controlled blood pressure, tachycardia, long history of smoking and COPD, obesity and deconditioning

## 2013-06-10 NOTE — Progress Notes (Signed)
Patient ID: Lisa Leblanc, female    DOB: 31-Mar-1960, 53 y.o.   MRN: [id number]  HPI Comments: Lisa Leblanc is a 53 year old woman, patient of Dr. Charlette CaffeyMorrissey, long history of smoking, malignant hypertension, on chronic opioid management for severe back pain, type 2 diabetes, presenting for followup after recent hospitalization 05/30/2011 with discharge 05/30/2013. She was admitted with new onset acute systolic heart failure, uncontrolled hypertension, diet-controlled diabetes, elevated troponin from demand ischemia.  Notes indicate she presented with respiratory distress, placed on BiPAP, had elevated BNP, chest x-ray consistent with heart failure. She was given aggressive diuresis with several liters of fluid removed. Creatinine climbed and losartan was held.  She reports having prior stress test and was told that this was normal. She states this was done in the past 6 months In followup today she reports that her blood pressure continues to run high. She has severe back pain which is chronic issue. Now with muscle cramping in her back radiating around her rib cage particularly on the left to her front. Difficulty with moving from a chair to the table and with walking. She reports she was doing well when she got home from the hospital but now feels that her fluid is returning  She reports that she was given oxygen in the past but does not have this anymore. At the time she did not feel like she needed this. Previous blood pressure pill Norvasc caused severe leg swelling  Echocardiogram 05/30/2013 shows ejection fraction 30-35%, moderate LVH, normal right ventricular systolic pressures Stress test in June 2014, pharmacologic Myoview, showing no significant ischemia, ejection fraction 51%  EKG today shows normal sinus rhythm with rate 99 beats per minute, no significant ST or T wave changes    Outpatient Encounter Prescriptions as of 06/10/2013  Medication Sig  . Biotin 10 MG CAPS Take by  mouth daily.  . Cholecalciferol (VITAMIN D-3 PO) Take 1,000 mg by mouth daily.  . cyclobenzaprine (FLEXERIL) 10 MG tablet Take 10 mg by mouth at bedtime.   . furosemide (LASIX) 40 MG tablet Take 40 mg by mouth daily.   . hydrALAZINE (APRESOLINE) 25 MG tablet Take 25 mg by mouth 3 (three) times daily.  Marland Kitchen. labetalol (NORMODYNE) 100 MG tablet Take 100 mg by mouth daily.   Marland Kitchen. oxyCODONE (OXYCONTIN) 20 MG 12 hr tablet Take 60 mg by mouth every 12 (twelve) hours. pain  . predniSONE (DELTASONE) 10 MG tablet Take 10 mg by mouth taper from 4 doses each day to 1 dose and stop.   Marland Kitchen. senna-docusate (SENOKOT-S) 8.6-50 MG per tablet Take 1 tablet by mouth 2 (two) times daily.  Marland Kitchen. azithromycin (ZITHROMAX) 250 MG tablet Take 250 mg by mouth daily.   . benzonatate (TESSALON) 100 MG capsule Take 100 mg by mouth.     Review of Systems  Constitutional: Negative.   HENT: Negative.   Eyes: Negative.   Respiratory: Positive for shortness of breath.   Cardiovascular: Positive for leg swelling.  Gastrointestinal: Negative.   Endocrine: Negative.   Musculoskeletal: Positive for back pain.  Skin: Negative.   Allergic/Immunologic: Negative.   Neurological: Negative.   Hematological: Negative.   Psychiatric/Behavioral: Negative.   All other systems reviewed and are negative.    BP 160/123  Pulse 99  Ht 5\' 4"  (1.626 m)  Wt 180 lb 12 oz (81.988 kg)  BMI 31.01 kg/m2  SpO2 1%  Physical Exam  Nursing note and vitals reviewed. Constitutional: She is oriented to person, place, and time. She  appears well-developed and well-nourished.  Obese  HENT:  Head: Normocephalic.  Nose: Nose normal.  Mouth/Throat: Oropharynx is clear and moist.  Eyes: Conjunctivae are normal. Pupils are equal, round, and reactive to light.  Neck: Normal range of motion. Neck supple. No JVD present.  Cardiovascular: Normal rate, regular rhythm, S1 normal, S2 normal, normal heart sounds and intact distal pulses.  Exam reveals no gallop and  no friction rub.   No murmur heard. Pulmonary/Chest: Effort normal. No respiratory distress. She has decreased breath sounds. She has no wheezes. She has no rales. She exhibits no tenderness.  Abdominal: Soft. Bowel sounds are normal. She exhibits no distension. There is no tenderness.  Musculoskeletal: Normal range of motion. She exhibits no edema and no tenderness.  Lymphadenopathy:    She has no cervical adenopathy.  Neurological: She is alert and oriented to person, place, and time. Coordination normal.  Skin: Skin is warm and dry. No rash noted. No erythema.  Psychiatric: She has a normal mood and affect. Her behavior is normal. Judgment and thought content normal.    Assessment and Plan

## 2013-06-15 ENCOUNTER — Telehealth: Payer: Self-pay

## 2013-06-15 NOTE — Telephone Encounter (Signed)
Anesthesia has reviewed her chart for colonoscopy scheduled tomorrow.  Patient EF 30% and recent new cardiac issues.  Per Dr. Alene Mires cancel colonoscopy. She will need to have clearance from her cardiologist prior to rescheduling.  If cleared for colonoscopy she will need to have performed at the hospital.  I have been unable to reach the patient, but I did speak to her mother and explained the reason that it needed to be cancelled.  She will have the patient call back for any questions.

## 2013-06-16 ENCOUNTER — Encounter: Payer: BC Managed Care – PPO | Admitting: Gastroenterology

## 2013-06-23 ENCOUNTER — Ambulatory Visit: Payer: Self-pay | Admitting: Orthopedic Surgery

## 2013-06-30 ENCOUNTER — Inpatient Hospital Stay: Payer: Self-pay | Admitting: Student

## 2013-06-30 DIAGNOSIS — I519 Heart disease, unspecified: Secondary | ICD-10-CM

## 2013-06-30 DIAGNOSIS — I5022 Chronic systolic (congestive) heart failure: Secondary | ICD-10-CM

## 2013-06-30 DIAGNOSIS — I10 Essential (primary) hypertension: Secondary | ICD-10-CM

## 2013-06-30 DIAGNOSIS — I369 Nonrheumatic tricuspid valve disorder, unspecified: Secondary | ICD-10-CM

## 2013-06-30 LAB — COMPREHENSIVE METABOLIC PANEL
ALT: 31 U/L (ref 12–78)
ANION GAP: 4 — AB (ref 7–16)
Albumin: 3.2 g/dL — ABNORMAL LOW (ref 3.4–5.0)
Alkaline Phosphatase: 139 U/L — ABNORMAL HIGH
BUN: 15 mg/dL (ref 7–18)
Bilirubin,Total: 0.4 mg/dL (ref 0.2–1.0)
CALCIUM: 8.3 mg/dL — AB (ref 8.5–10.1)
CREATININE: 1.26 mg/dL (ref 0.60–1.30)
Chloride: 105 mmol/L (ref 98–107)
Co2: 29 mmol/L (ref 21–32)
EGFR (African American): 56 — ABNORMAL LOW
GFR CALC NON AF AMER: 48 — AB
Glucose: 111 mg/dL — ABNORMAL HIGH (ref 65–99)
Osmolality: 277 (ref 275–301)
Potassium: 3.1 mmol/L — ABNORMAL LOW (ref 3.5–5.1)
SGOT(AST): 45 U/L — ABNORMAL HIGH (ref 15–37)
Sodium: 138 mmol/L (ref 136–145)
Total Protein: 6.6 g/dL (ref 6.4–8.2)

## 2013-06-30 LAB — DRUG SCREEN, URINE
Amphetamines, Ur Screen: NEGATIVE (ref ?–1000)
BENZODIAZEPINE, UR SCRN: POSITIVE (ref ?–200)
Barbiturates, Ur Screen: NEGATIVE (ref ?–200)
CANNABINOID 50 NG, UR ~~LOC~~: POSITIVE (ref ?–50)
Cocaine Metabolite,Ur ~~LOC~~: NEGATIVE (ref ?–300)
MDMA (Ecstasy)Ur Screen: NEGATIVE (ref ?–500)
METHADONE, UR SCREEN: NEGATIVE (ref ?–300)
Opiate, Ur Screen: POSITIVE (ref ?–300)
PHENCYCLIDINE (PCP) UR S: NEGATIVE (ref ?–25)
Tricyclic, Ur Screen: POSITIVE (ref ?–1000)

## 2013-06-30 LAB — TROPONIN I
TROPONIN-I: 0.09 ng/mL — AB
Troponin-I: 0.06 ng/mL — ABNORMAL HIGH
Troponin-I: 0.04 ng/mL

## 2013-06-30 LAB — CBC WITH DIFFERENTIAL/PLATELET
BASOS ABS: 0 10*3/uL (ref 0.0–0.1)
Basophil %: 0.3 %
EOS ABS: 0 10*3/uL (ref 0.0–0.7)
EOS PCT: 0 %
HCT: 32.9 % — AB (ref 35.0–47.0)
HGB: 10.7 g/dL — AB (ref 12.0–16.0)
LYMPHS ABS: 0.4 10*3/uL — AB (ref 1.0–3.6)
LYMPHS PCT: 2.7 %
MCH: 28.2 pg (ref 26.0–34.0)
MCHC: 32.6 g/dL (ref 32.0–36.0)
MCV: 87 fL (ref 80–100)
MONOS PCT: 1.6 %
Monocyte #: 0.3 x10 3/mm (ref 0.2–0.9)
NEUTROS PCT: 95.4 %
Neutrophil #: 15 10*3/uL — ABNORMAL HIGH (ref 1.4–6.5)
PLATELETS: 338 10*3/uL (ref 150–440)
RBC: 3.79 10*6/uL — ABNORMAL LOW (ref 3.80–5.20)
RDW: 16.7 % — ABNORMAL HIGH (ref 11.5–14.5)
WBC: 15.8 10*3/uL — ABNORMAL HIGH (ref 3.6–11.0)

## 2013-06-30 LAB — PROTIME-INR
INR: 1
PROTHROMBIN TIME: 13.5 s (ref 11.5–14.7)

## 2013-06-30 LAB — APTT: Activated PTT: 24.7 secs (ref 23.6–35.9)

## 2013-06-30 LAB — POTASSIUM: Potassium: 3.2 mmol/L — ABNORMAL LOW (ref 3.5–5.1)

## 2013-06-30 LAB — MAGNESIUM: Magnesium: 1.8 mg/dL

## 2013-07-01 LAB — MAGNESIUM: Magnesium: 2.4 mg/dL

## 2013-07-01 LAB — BASIC METABOLIC PANEL
ANION GAP: 7 (ref 7–16)
BUN: 21 mg/dL — AB (ref 7–18)
CREATININE: 1.56 mg/dL — AB (ref 0.60–1.30)
Calcium, Total: 8.3 mg/dL — ABNORMAL LOW (ref 8.5–10.1)
Chloride: 105 mmol/L (ref 98–107)
Co2: 26 mmol/L (ref 21–32)
EGFR (African American): 43 — ABNORMAL LOW
EGFR (Non-African Amer.): 37 — ABNORMAL LOW
GLUCOSE: 103 mg/dL — AB (ref 65–99)
Osmolality: 279 (ref 275–301)
Potassium: 4.1 mmol/L (ref 3.5–5.1)
Sodium: 138 mmol/L (ref 136–145)

## 2013-07-01 LAB — CBC WITH DIFFERENTIAL/PLATELET
BASOS ABS: 0.1 10*3/uL (ref 0.0–0.1)
Basophil %: 1.1 %
Eosinophil #: 0 10*3/uL (ref 0.0–0.7)
Eosinophil %: 0 %
HCT: 31 % — AB (ref 35.0–47.0)
HGB: 10.2 g/dL — AB (ref 12.0–16.0)
LYMPHS ABS: 0.8 10*3/uL — AB (ref 1.0–3.6)
Lymphocyte %: 9.5 %
MCH: 28.3 pg (ref 26.0–34.0)
MCHC: 32.8 g/dL (ref 32.0–36.0)
MCV: 86 fL (ref 80–100)
MONO ABS: 0.5 x10 3/mm (ref 0.2–0.9)
MONOS PCT: 5.4 %
Neutrophil #: 7.1 10*3/uL — ABNORMAL HIGH (ref 1.4–6.5)
Neutrophil %: 84 %
Platelet: 319 10*3/uL (ref 150–440)
RBC: 3.59 10*6/uL — ABNORMAL LOW (ref 3.80–5.20)
RDW: 16.5 % — ABNORMAL HIGH (ref 11.5–14.5)
WBC: 8.5 10*3/uL (ref 3.6–11.0)

## 2013-07-01 LAB — TRIGLYCERIDES: Triglycerides: 192 mg/dL (ref 0–200)

## 2013-07-02 DIAGNOSIS — I10 Essential (primary) hypertension: Secondary | ICD-10-CM

## 2013-07-02 LAB — BASIC METABOLIC PANEL
Anion Gap: 3 — ABNORMAL LOW (ref 7–16)
BUN: 14 mg/dL (ref 7–18)
CALCIUM: 8 mg/dL — AB (ref 8.5–10.1)
CO2: 28 mmol/L (ref 21–32)
Chloride: 108 mmol/L — ABNORMAL HIGH (ref 98–107)
Creatinine: 0.96 mg/dL (ref 0.60–1.30)
EGFR (African American): >60
EGFR (Non-African Amer.): >60
Glucose: 104 mg/dL — ABNORMAL HIGH (ref 65–99)
OSMOLALITY: 278 (ref 275–301)
Potassium: 2.9 mmol/L — ABNORMAL LOW (ref 3.5–5.1)
Sodium: 139 mmol/L (ref 136–145)

## 2013-07-02 LAB — PATHOLOGY REPORT

## 2013-07-03 LAB — BASIC METABOLIC PANEL
Anion Gap: 5 — ABNORMAL LOW (ref 7–16)
BUN: 18 mg/dL (ref 7–18)
CHLORIDE: 107 mmol/L (ref 98–107)
Calcium, Total: 8.7 mg/dL (ref 8.5–10.1)
Co2: 27 mmol/L (ref 21–32)
Creatinine: 1.34 mg/dL — ABNORMAL HIGH (ref 0.60–1.30)
GFR CALC AF AMER: 52 — AB
GFR CALC NON AF AMER: 45 — AB
GLUCOSE: 112 mg/dL — AB (ref 65–99)
Osmolality: 280 (ref 275–301)
Potassium: 4.2 mmol/L (ref 3.5–5.1)
Sodium: 139 mmol/L (ref 136–145)

## 2013-07-03 LAB — HEMOGLOBIN: HGB: 10.7 g/dL — ABNORMAL LOW (ref 12.0–16.0)

## 2013-07-04 ENCOUNTER — Ambulatory Visit: Payer: Self-pay | Admitting: Internal Medicine

## 2013-07-04 DIAGNOSIS — I5023 Acute on chronic systolic (congestive) heart failure: Secondary | ICD-10-CM

## 2013-07-04 LAB — BASIC METABOLIC PANEL
Anion Gap: 6 — ABNORMAL LOW (ref 7–16)
BUN: 33 mg/dL — AB (ref 7–18)
CREATININE: 1.72 mg/dL — AB (ref 0.60–1.30)
Calcium, Total: 8.7 mg/dL (ref 8.5–10.1)
Chloride: 106 mmol/L (ref 98–107)
Co2: 26 mmol/L (ref 21–32)
EGFR (Non-African Amer.): 33 — ABNORMAL LOW
GFR CALC AF AMER: 38 — AB
GLUCOSE: 95 mg/dL (ref 65–99)
Osmolality: 283 (ref 275–301)
Potassium: 4.4 mmol/L (ref 3.5–5.1)
Sodium: 138 mmol/L (ref 136–145)

## 2013-07-05 LAB — BASIC METABOLIC PANEL
ANION GAP: 5 — AB (ref 7–16)
BUN: 34 mg/dL — ABNORMAL HIGH (ref 7–18)
Calcium, Total: 8.5 mg/dL (ref 8.5–10.1)
Chloride: 106 mmol/L (ref 98–107)
Co2: 27 mmol/L (ref 21–32)
Creatinine: 1.66 mg/dL — ABNORMAL HIGH (ref 0.60–1.30)
EGFR (African American): 40 — ABNORMAL LOW
EGFR (Non-African Amer.): 35 — ABNORMAL LOW
Glucose: 82 mg/dL (ref 65–99)
Osmolality: 282 (ref 275–301)
Potassium: 4.1 mmol/L (ref 3.5–5.1)
SODIUM: 138 mmol/L (ref 136–145)

## 2013-07-06 LAB — CBC WITH DIFFERENTIAL/PLATELET
Basophil #: 0 10*3/uL (ref 0.0–0.1)
Basophil %: 0.6 %
EOS ABS: 0 10*3/uL (ref 0.0–0.7)
Eosinophil %: 0.7 %
HCT: 30.3 % — AB (ref 35.0–47.0)
HGB: 9.8 g/dL — ABNORMAL LOW (ref 12.0–16.0)
Lymphocyte #: 1.1 10*3/uL (ref 1.0–3.6)
Lymphocyte %: 17.3 %
MCH: 28.2 pg (ref 26.0–34.0)
MCHC: 32.4 g/dL (ref 32.0–36.0)
MCV: 87 fL (ref 80–100)
MONOS PCT: 12 %
Monocyte #: 0.7 x10 3/mm (ref 0.2–0.9)
Neutrophil #: 4.3 10*3/uL (ref 1.4–6.5)
Neutrophil %: 69.4 %
Platelet: 344 10*3/uL (ref 150–440)
RBC: 3.48 10*6/uL — AB (ref 3.80–5.20)
RDW: 16.9 % — ABNORMAL HIGH (ref 11.5–14.5)
WBC: 6.1 10*3/uL (ref 3.6–11.0)

## 2013-07-06 LAB — BASIC METABOLIC PANEL
Anion Gap: 6 — ABNORMAL LOW (ref 7–16)
BUN: 24 mg/dL — ABNORMAL HIGH (ref 7–18)
CHLORIDE: 108 mmol/L — AB (ref 98–107)
CO2: 25 mmol/L (ref 21–32)
CREATININE: 1.18 mg/dL (ref 0.60–1.30)
Calcium, Total: 8.1 mg/dL — ABNORMAL LOW (ref 8.5–10.1)
EGFR (African American): >60
EGFR (Non-African Amer.): 52 — ABNORMAL LOW
GLUCOSE: 77 mg/dL (ref 65–99)
Osmolality: 280 (ref 275–301)
Potassium: 4 mmol/L (ref 3.5–5.1)
Sodium: 139 mmol/L (ref 136–145)

## 2013-07-07 ENCOUNTER — Telehealth: Payer: Self-pay

## 2013-07-07 NOTE — Telephone Encounter (Signed)
Attempted to contact pt regarding discharge from [facility] on 07/06/13. No answer, no machine at number provided.

## 2013-07-11 ENCOUNTER — Ambulatory Visit (INDEPENDENT_AMBULATORY_CARE_PROVIDER_SITE_OTHER): Payer: BC Managed Care – PPO | Admitting: Cardiovascular Disease

## 2013-07-11 ENCOUNTER — Encounter: Payer: Self-pay | Admitting: Cardiovascular Disease

## 2013-07-11 VITALS — BP 132/100 | HR 93 | Ht 64.0 in | Wt 164.0 lb

## 2013-07-11 DIAGNOSIS — F1120 Opioid dependence, uncomplicated: Secondary | ICD-10-CM

## 2013-07-11 DIAGNOSIS — R079 Chest pain, unspecified: Secondary | ICD-10-CM

## 2013-07-11 DIAGNOSIS — M549 Dorsalgia, unspecified: Secondary | ICD-10-CM

## 2013-07-11 DIAGNOSIS — I10 Essential (primary) hypertension: Secondary | ICD-10-CM

## 2013-07-11 DIAGNOSIS — R0789 Other chest pain: Secondary | ICD-10-CM | POA: Insufficient documentation

## 2013-07-11 DIAGNOSIS — F1920 Other psychoactive substance dependence, uncomplicated: Secondary | ICD-10-CM

## 2013-07-11 DIAGNOSIS — R0602 Shortness of breath: Secondary | ICD-10-CM

## 2013-07-11 DIAGNOSIS — E119 Type 2 diabetes mellitus without complications: Secondary | ICD-10-CM

## 2013-07-11 MED ORDER — LISINOPRIL 10 MG PO TABS: 10.0000 mg | ORAL_TABLET | Freq: Every day | ORAL | Status: DC

## 2013-07-11 MED ORDER — LISINOPRIL 10 MG PO TABS: 5.0000 mg | ORAL_TABLET | Freq: Every day | ORAL | Status: DC

## 2013-07-11 MED ORDER — POTASSIUM CHLORIDE ER 10 MEQ PO TBCR: 10.0000 meq | EXTENDED_RELEASE_TABLET | Freq: Every day | ORAL | Status: DC

## 2013-07-11 NOTE — Assessment & Plan Note (Signed)
She is scheduled to followup with Dr. Rosita Kea. She reports severe discomfort and is willing to proceed with reattempt at surgery. No further testing needed but suggested she talk with him about alternative way to proceed with anesthesia. Uncertain if she would be a candidate for epidural

## 2013-07-11 NOTE — Assessment & Plan Note (Signed)
Likely has residual pain from CPR. This will likely get better over thenext several weeks

## 2013-07-11 NOTE — Assessment & Plan Note (Signed)
Several questions about her pain medication today. Suggested she discuss this with Dr. Charlette Caffey or Dr. Rosita Kea as I am less familiar with these medications

## 2013-07-11 NOTE — Assessment & Plan Note (Signed)
Blood pressure continues to run high, diastolic in particular. We'll increase her lisinopril slowly up to 10 mg daily. If blood pressure continues to run high we will increase the lisinopril to 20 mg daily

## 2013-07-11 NOTE — Assessment & Plan Note (Signed)
We have encouraged continued careful diet management in an effort to lose weight.  

## 2013-07-11 NOTE — Progress Notes (Signed)
Patient ID: Lisa EngGloria H Leblanc, female    DOB: 05/10/1960, 53 y.o.   MRN: [id number]  HPI Comments: Lisa Leblanc is a 53 year old woman, patient of Dr. Charlette CaffeyMorrissey, long history of smoking, malignant hypertension, on chronic opioid management for severe back pain, type 2 diabetes, presenting for followup after recent hospitalization 05/29/2013 with discharge 05/30/2013. She was admitted with new onset acute systolic heart failure, uncontrolled hypertension, diet-controlled diabetes, elevated troponin from demand ischemia.   she presented with respiratory distress, placed on BiPAP, had elevated BNP, chest x-ray consistent with heart failure. She was given aggressive diuresis with several liters of fluid removed. Creatinine climbed and losartan was held.  She reports having prior stress test and was told that this was normal. She states this was done in the past 6 months On her last clinic visit, blood pressure was running very high. Medications were adjusted. At that time she was having severe back pain. We are unaware but she was evaluated by orthopedics and scheduled for kyphoplasty. attempted kyphoplasty was scheduled 2 weeks ago but she had cardiac arrest requiring short period of CPR. The surgery was aborted. She was extubated on 07/01/2013 and was kept in the hospital for several days with chronic pain of her sternum and back Blood pressure medications were titrated while she was in the hospital  In followup today she reports having general malaise. She does not check her blood pressure at home and she does not have a blood pressure cuff. She has questions about her pain medications. She denies any leg edema, shortness of breath. She does have some pain in her sternum, reproducible with palpation and movement  Previous blood pressure pill Norvasc caused severe leg swelling  Echocardiogram 05/30/2013 shows ejection fraction 30-35%, moderate LVH, normal right ventricular systolic pressures Stress  test in June 2014, pharmacologic Myoview, showing no significant ischemia, ejection fraction 51%  EKG today shows normal sinus rhythm with rate 93 beats per minute, no significant ST or T wave changes  Outpatient Encounter Prescriptions as of 07/11/2013  Medication Sig  . albuterol (PROVENTIL HFA;VENTOLIN HFA) 108 (90 BASE) MCG/ACT inhaler Inhale 2 puffs into the lungs every 6 (six) hours as needed for wheezing or shortness of breath.  . Biotin 10 MG CAPS Take by mouth daily.  . carvedilol (COREG) 25 MG tablet Take 25 mg by mouth 2 (two) times daily with a meal.   . Cholecalciferol (VITAMIN D-3 PO) Take 1,000 mg by mouth daily.  . cyclobenzaprine (FLEXERIL) 10 MG tablet Take 10 mg by mouth 3 times/day as needed-between meals & bedtime.   . furosemide (LASIX) 40 MG tablet Take 1 tablet (40 mg total) by mouth 2 (two) times daily as needed.  Marland Kitchen. lisinopril (PRINIVIL,ZESTRIL) 5 MG tablet Take 5 mg by mouth daily.   . OxyCODONE (OXYCONTIN) 10 mg T12A 12 hr tablet Take 10 mg by mouth every 12 (twelve) hours.  . senna-docusate (SENOKOT-S) 8.6-50 MG per tablet Take 1 tablet by mouth 2 (two) times daily.   Review of Systems  Constitutional: Negative.   HENT: Negative.   Eyes: Negative.   Cardiovascular: Positive for chest pain.  Gastrointestinal: Negative.   Endocrine: Negative.   Musculoskeletal: Positive for back pain.  Skin: Negative.   Allergic/Immunologic: Negative.   Neurological: Negative.   Hematological: Negative.   Psychiatric/Behavioral: Negative.   All other systems reviewed and are negative.   BP 132/100  Pulse 93  Ht 5\' 4"  (1.626 m)  Wt 164 lb (74.39 kg)  BMI 28.14  kg/m2  Physical Exam  Nursing note and vitals reviewed. Constitutional: She is oriented to person, place, and time. She appears well-developed and well-nourished.  Obese  HENT:  Head: Normocephalic.  Nose: Nose normal.  Mouth/Throat: Oropharynx is clear and moist.  Eyes: Conjunctivae are normal. Pupils are  equal, round, and reactive to light.  Neck: Normal range of motion. Neck supple. No JVD present.  Cardiovascular: Normal rate, regular rhythm, S1 normal, S2 normal, normal heart sounds and intact distal pulses.  Exam reveals no gallop and no friction rub.   No murmur heard. Pulmonary/Chest: Effort normal. No respiratory distress. She has decreased breath sounds. She has no wheezes. She has no rales. She exhibits no tenderness.  Abdominal: Soft. Bowel sounds are normal. She exhibits no distension. There is no tenderness.  Musculoskeletal: Normal range of motion. She exhibits no edema and no tenderness.  Lymphadenopathy:    She has no cervical adenopathy.  Neurological: She is alert and oriented to person, place, and time. Coordination normal.  Skin: Skin is warm and dry. No rash noted. No erythema.  Psychiatric: She has a normal mood and affect. Her behavior is normal. Judgment and thought content normal.    Assessment and Plan

## 2013-07-11 NOTE — Telephone Encounter (Signed)
Second attempt to contact patient for TCM call. No answer. No voicemail.

## 2013-07-11 NOTE — Patient Instructions (Addendum)
Ok to take naproxen (aleve) for pain, twice a day Take with the pain pill  Take lasix as needed for ankle swelling  Take with potassium pill (or banana)  Please increase the lisinopril to 10 mg daily  Please call us if you have new heart issues that need to be addressed before your next appt.  Your physician wants you to follow-up in: 3 months.

## 2013-08-08 ENCOUNTER — Telehealth: Payer: Self-pay

## 2013-08-08 NOTE — Telephone Encounter (Signed)
Could not LMOM because voice mail is full, but the automated system has our number to contact the office back.

## 2013-08-08 NOTE — Telephone Encounter (Signed)
Pt called, would like someone to call her regarding her surgical clearance needed by Monday 08/15/2013

## 2013-08-25 ENCOUNTER — Ambulatory Visit: Payer: BC Managed Care – PPO | Admitting: Cardiovascular Disease

## 2013-09-29 ENCOUNTER — Encounter: Payer: Self-pay | Admitting: Nurse Practitioner

## 2013-09-29 ENCOUNTER — Telehealth: Payer: Self-pay | Admitting: *Deleted

## 2013-09-29 ENCOUNTER — Ambulatory Visit (INDEPENDENT_AMBULATORY_CARE_PROVIDER_SITE_OTHER): Payer: BC Managed Care – PPO | Admitting: Nurse Practitioner

## 2013-09-29 VITALS — BP 160/92 | HR 84 | Ht 64.0 in | Wt 168.5 lb

## 2013-09-29 DIAGNOSIS — F17200 Nicotine dependence, unspecified, uncomplicated: Secondary | ICD-10-CM

## 2013-09-29 DIAGNOSIS — I509 Heart failure, unspecified: Secondary | ICD-10-CM

## 2013-09-29 DIAGNOSIS — I10 Essential (primary) hypertension: Secondary | ICD-10-CM

## 2013-09-29 DIAGNOSIS — I5022 Chronic systolic (congestive) heart failure: Secondary | ICD-10-CM | POA: Insufficient documentation

## 2013-09-29 DIAGNOSIS — R0789 Other chest pain: Secondary | ICD-10-CM

## 2013-09-29 DIAGNOSIS — I428 Other cardiomyopathies: Secondary | ICD-10-CM | POA: Insufficient documentation

## 2013-09-29 DIAGNOSIS — I5023 Acute on chronic systolic (congestive) heart failure: Secondary | ICD-10-CM

## 2013-09-29 DIAGNOSIS — Z720 Tobacco use: Secondary | ICD-10-CM

## 2013-09-29 MED ORDER — HYDRALAZINE HCL 100 MG PO TABS: 100.0000 mg | ORAL_TABLET | Freq: Three times a day (TID) | ORAL | Status: DC

## 2013-09-29 MED ORDER — FUROSEMIDE 80 MG PO TABS: 80.0000 mg | ORAL_TABLET | Freq: Two times a day (BID) | ORAL | Status: DC
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
## 2013-09-29 NOTE — Telephone Encounter (Signed)
Spoke w/ pt.  She reports that her legs are swelling, getting worse.  Was at pain clinic w/ her husband, doctor advised her to see cardiologist today or end up at the hospital.  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  Reports she has been taking her lasix daily and trying to keep her feel elevated.  Reports swelling around her bra strap at the end of the day and worsening SOB. Pt sched to see Ward Givens, NP today at 2:45.

## 2013-09-29 NOTE — Patient Instructions (Addendum)
Please continue your hydralazine 100mg  three times a day. Please increase your labetolol to 300mg  three times a day. Please increase your lasix to 80mg  2 times a day.  We will draw labs on you today: BMET  Your physician recommends that you schedule a follow-up appointment on Mon or Tues with Dr. Mariah Milling or Eula Listen, PA

## 2013-09-29 NOTE — Telephone Encounter (Signed)
Patient called and stated that her feet/ankles and legs up to knees are swollen for a week now and not sure what to do, please call.

## 2013-10-03 ENCOUNTER — Ambulatory Visit (INDEPENDENT_AMBULATORY_CARE_PROVIDER_SITE_OTHER): Payer: BC Managed Care – PPO | Admitting: Cardiovascular Disease

## 2013-10-03 ENCOUNTER — Encounter: Payer: Self-pay | Admitting: Cardiovascular Disease

## 2013-10-03 VITALS — BP 110/82 | HR 81 | Ht 64.0 in | Wt 160.0 lb

## 2013-10-03 DIAGNOSIS — E118 Type 2 diabetes mellitus with unspecified complications: Secondary | ICD-10-CM

## 2013-10-03 DIAGNOSIS — R0789 Other chest pain: Secondary | ICD-10-CM

## 2013-10-03 DIAGNOSIS — R0602 Shortness of breath: Secondary | ICD-10-CM

## 2013-10-03 DIAGNOSIS — I10 Essential (primary) hypertension: Secondary | ICD-10-CM

## 2013-10-03 DIAGNOSIS — I5022 Chronic systolic (congestive) heart failure: Secondary | ICD-10-CM

## 2013-10-03 DIAGNOSIS — E669 Obesity, unspecified: Secondary | ICD-10-CM

## 2013-10-03 DIAGNOSIS — I509 Heart failure, unspecified: Secondary | ICD-10-CM

## 2013-10-03 NOTE — Patient Instructions (Addendum)
You are doing well. No medication changes were made.  We will schedule a stress test for chest pain, shortness of breath No smoking for 24 hours before the test,   Please call us if you have new issues that need to be addressed before your next appt.  Your physician wants you to follow-up in: 3 months.  You will receive a reminder letter in the mail two months in advance. If you don't receive a letter, please call our office to schedule the follow-up appointment.  [facility] MYOVIEW  Your caregiver has ordered a Stress Test with nuclear imaging. The purpose of this test is to evaluate the blood supply to your heart muscle. This procedure is referred to as a "Non-Invasive Stress Test." This is because other than having an IV started in your vein, nothing is inserted or "invades" your body. Cardiac stress tests are done to find areas of poor blood flow to the heart by determining the extent of coronary artery disease (CAD). Some patients exercise on a treadmill, which naturally increases the blood flow to your heart, while others who are  unable to walk on a treadmill due to physical limitations have a pharmacologic/chemical stress agent called Lexiscan . This medicine will mimic walking on a treadmill by temporarily increasing your coronary blood flow.   Please note: these test may take anywhere between 2-4 hours to complete  PLEASE REPORT TO [facility]TR[facility]  THE VOLUNTEERS AT THE FIRST DESK WILL DIRECT YOU WHERE TO GO  Date of Procedure:_____Tuesday, July 14______  Arrival Time for Procedure:______7:45am_______  Instructions regarding medication:   _X__:  Hold medications as follows:_____Labetolol & hydralazine____________________  PLEASE NOTIFY THE OFFICE AT LEAST 24 HOURS IN ADVANCE IF YOU ARE UNABLE TO KEEP YOUR APPOINTMENT.  [phone] AND  PLEASE NOTIFY NUCLEAR MEDICINE AT [facility] AT LEAST 24 HOURS IN ADVANCE IF YOU ARE UNABLE TO KEEP YOUR APPOINTMENT. [phone]  How to  prepare for your Myoview test:  1. Do not eat or drink after midnight 2. No caffeine for 24 hours prior to test 3. No smoking 24 hours prior to test. 4. Your medication may be taken with water.  If your doctor stopped a medication because of this test, do not take that medication. 5. Ladies, please do not wear dresses.  Skirts or pants are appropriate. Please wear a short sleeve shirt. 6. No perfume, cologne or lotion. 7. Wear comfortable walking shoes. No heels!

## 2013-10-03 NOTE — Assessment & Plan Note (Addendum)
We have recommended that she continue on Lasix 80 mg twice a day, holding the Lasix when lower extremity edema resolved. She has trace edema on today's visit. Weight is down 8 pounds

## 2013-10-03 NOTE — Assessment & Plan Note (Signed)
We have encouraged continued exercise, careful diet management in an effort to lose weight. 

## 2013-10-03 NOTE — Assessment & Plan Note (Addendum)
Recommended diet modification in an effort to lose weight

## 2013-10-03 NOTE — Assessment & Plan Note (Signed)
Blood pressure much improved from prior clinic visits. Continue current medication regimen

## 2013-10-03 NOTE — Assessment & Plan Note (Signed)
She is high risk for coronary arterial disease. She has chest tightness concerning for angina. We have ordered a pharmacologic Myoview. She is unable to treadmill secondary to her severe back pain. If Myoview showed no ischemia, potentially she could proceed with back surgery. Blood pressure has significantly improved.

## 2013-10-03 NOTE — Progress Notes (Signed)
Patient ID: Lisa Leblanc, female    DOB: [birth year], 53 y.o.   MRN: [id number]  HPI Comments: Lisa Leblanc is a 53 year old woman, patient of Dr. Charlette CaffeyMorrissey, long history of smoking, malignant hypertension, on chronic opioid management for severe back pain, type 2 diabetes,  recent hospitalization 05/29/2013 with discharge 05/30/2013. She was admitted with new onset acute systolic heart failure, uncontrolled hypertension, diet-controlled diabetes, elevated troponin from demand ischemia. Cardiac arrest during back surgery April 2015 requiring CPR, surgery aborted, extubated 07/01/2013. Severe hypertension at the time.   In followup today, she reports that her weight is down 8 pounds from her recent office visit. Recently seen by cardiology and Lasix increased up to 80 mg twice a day, labetalol increased up to 3 times a day, hydralazine up to 3 times a day for severe hypertension. She states that her blood pressure is much better. At home systolic pressure 120. It seems that amlodipine caused worsening leg swelling and she has stopped this. He continues to have mild leg edema that seems to come and go but much improved. She continues to have a tightness around her chest described as a rubber band, difficulty breathing, worse in the afternoons, evenings and mornings. Sometimes has symptoms with exertion. She denies having any sleep apnea. She reports that she does not have COPD but she has smoked for most of her life. She states that she has had lung studies (PFTS?) And she did not have COPD He continues to have severe back pain. He states that she would like to have surgery but has been declined for uncertain reasons, possibly for cardiac issues  Recent hospital admission in March 2015, she presented with respiratory distress, placed on BiPAP, had elevated BNP, chest x-ray consistent with heart failure. She was given aggressive diuresis with several liters of fluid removed. Creatinine climbed and  losartan was held.  She reports having prior stress test and was told that this was normal. In 2014  In followup today she reports having general malaise. She does not check her blood pressure at home and she does not have a blood pressure cuff. She has questions about her pain medications. She denies any leg edema, shortness of breath. She does have some pain in her sternum, reproducible with palpation and movement  Echocardiogram 05/30/2013 shows ejection fraction 30-35%, moderate LVH, normal right ventricular systolic pressures Stress test in June 2014, pharmacologic Myoview, showing no significant ischemia, ejection fraction 51%  EKG today shows normal sinus rhythm with rate 81 beats per minute, no significant ST or T wave changes  Outpatient Encounter Prescriptions as of 10/03/2013  Medication Sig  . albuterol (PROVENTIL HFA;VENTOLIN HFA) 108 (90 BASE) MCG/ACT inhaler Inhale 2 puffs into the lungs every 6 (six) hours as needed for wheezing or shortness of breath.  Marland Kitchen. amLODipine (NORVASC) 5 MG tablet Take 5 mg by mouth daily.  . Biotin 10 MG CAPS Take by mouth daily.  . Cholecalciferol (VITAMIN D-3 PO) Take 1,000 mg by mouth daily.  . cyclobenzaprine (FLEXERIL) 10 MG tablet Take 10 mg by mouth 3 times/day as needed-between meals & bedtime.   . furosemide (LASIX) 80 MG tablet Take 1 tablet (80 mg total) by mouth 2 (two) times daily.  . hydrALAZINE (APRESOLINE) 100 MG tablet Take 1 tablet (100 mg total) by mouth 3 (three) times daily.  Marland Kitchen. labetalol (NORMODYNE) 300 MG tablet Take 300 mg by mouth 3 (three) times daily.  . Linaclotide (LINZESS) 145 MCG CAPS capsule Take 145 mcg by  mouth daily.  Marland Kitchen lisinopril (PRINIVIL,ZESTRIL) 10 MG tablet Take 0.5 mg by mouth daily.  . mometasone-formoterol (DULERA) 100-5 MCG/ACT AERO Inhale 2 puffs into the lungs as needed for wheezing.  . Oxycodone HCl 20 MG TABS Take by mouth as needed.  . potassium chloride (K-DUR) 10 MEQ tablet Take 1 tablet (10 mEq total)  by mouth daily.  Marland Kitchen senna-docusate (SENOKOT-S) 8.6-50 MG per tablet Take 1 tablet by mouth 2 (two) times daily.    Review of Systems  Constitutional: Negative.   HENT: Negative.   Eyes: Negative.   Respiratory: Positive for chest tightness and shortness of breath.   Cardiovascular: Positive for leg swelling.  Gastrointestinal: Negative.   Endocrine: Negative.   Musculoskeletal: Positive for back pain.  Skin: Negative.   Allergic/Immunologic: Negative.   Neurological: Negative.   Hematological: Negative.   Psychiatric/Behavioral: Negative.   All other systems reviewed and are negative.   BP 110/82  Pulse 81  Ht 5\' 4"  (1.626 m)  Wt 160 lb (72.576 kg)  BMI 27.45 kg/m2  Physical Exam  Nursing note and vitals reviewed. Constitutional: She is oriented to person, place, and time. She appears well-developed and well-nourished.  Obese  HENT:  Head: Normocephalic.  Nose: Nose normal.  Mouth/Throat: Oropharynx is clear and moist.  Eyes: Conjunctivae are normal. Pupils are equal, round, and reactive to light.  Neck: Normal range of motion. Neck supple. No JVD present.  Cardiovascular: Normal rate, regular rhythm, S1 normal, S2 normal, normal heart sounds and intact distal pulses.  Exam reveals no gallop and no friction rub.   No murmur heard. Pulmonary/Chest: Effort normal. No respiratory distress. She has decreased breath sounds. She has no wheezes. She has no rales. She exhibits no tenderness.  Abdominal: Soft. Bowel sounds are normal. She exhibits no distension. There is no tenderness.  Musculoskeletal: Normal range of motion. She exhibits no edema and no tenderness.  Lymphadenopathy:    She has no cervical adenopathy.  Neurological: She is alert and oriented to person, place, and time. Coordination normal.  Skin: Skin is warm and dry. No rash noted. No erythema.  Psychiatric: She has a normal mood and affect. Her behavior is normal. Judgment and thought content normal.     Assessment and Plan

## 2013-10-04 ENCOUNTER — Ambulatory Visit: Payer: Self-pay | Admitting: Cardiovascular Disease

## 2013-10-04 DIAGNOSIS — R079 Chest pain, unspecified: Secondary | ICD-10-CM

## 2013-10-04 DIAGNOSIS — R0602 Shortness of breath: Secondary | ICD-10-CM

## 2013-10-05 ENCOUNTER — Encounter: Payer: Self-pay | Admitting: Cardiovascular Disease

## 2013-10-07 ENCOUNTER — Telehealth: Payer: Self-pay | Admitting: *Deleted

## 2013-10-07 NOTE — Telephone Encounter (Signed)
Patient called wanting stress test results. 

## 2013-10-07 NOTE — Telephone Encounter (Signed)
There is a problem having [facility] send results to [facility].  Have printed results for Dr. Mariah Milling to review.

## 2013-11-10 ENCOUNTER — Ambulatory Visit: Payer: Self-pay | Admitting: Orthopedic Surgery

## 2013-11-16 ENCOUNTER — Ambulatory Visit: Payer: Self-pay | Admitting: Orthopedic Surgery

## 2013-11-16 LAB — BASIC METABOLIC PANEL
Anion Gap: 8 (ref 7–16)
BUN: 18 mg/dL (ref 7–18)
CALCIUM: 8.3 mg/dL — AB (ref 8.5–10.1)
CHLORIDE: 105 mmol/L (ref 98–107)
CO2: 29 mmol/L (ref 21–32)
Creatinine: 1.37 mg/dL — ABNORMAL HIGH (ref 0.60–1.30)
EGFR (African American): 51 — ABNORMAL LOW
EGFR (Non-African Amer.): 44 — ABNORMAL LOW
Glucose: 109 mg/dL — ABNORMAL HIGH (ref 65–99)
Osmolality: 286 (ref 275–301)
Potassium: 3.4 mmol/L — ABNORMAL LOW (ref 3.5–5.1)
Sodium: 142 mmol/L (ref 136–145)

## 2013-11-16 LAB — URINALYSIS, COMPLETE
BACTERIA: NONE SEEN
BLOOD: NEGATIVE
Bilirubin,UR: NEGATIVE
GLUCOSE, UR: NEGATIVE mg/dL (ref 0–75)
Ketone: NEGATIVE
Leukocyte Esterase: NEGATIVE
Nitrite: NEGATIVE
Ph: 6 (ref 4.5–8.0)
Protein: NEGATIVE
Specific Gravity: 1.017 (ref 1.003–1.030)
WBC UR: 3 /HPF (ref 0–5)

## 2013-11-16 LAB — CBC
HCT: 31.3 % — ABNORMAL LOW (ref 35.0–47.0)
HGB: 10.1 g/dL — ABNORMAL LOW (ref 12.0–16.0)
MCH: 28.4 pg (ref 26.0–34.0)
MCHC: 32.3 g/dL (ref 32.0–36.0)
MCV: 88 fL (ref 80–100)
Platelet: 346 10*3/uL (ref 150–440)
RBC: 3.56 10*6/uL — ABNORMAL LOW (ref 3.80–5.20)
RDW: 17.1 % — ABNORMAL HIGH (ref 11.5–14.5)
WBC: 8.3 10*3/uL (ref 3.6–11.0)

## 2013-11-16 LAB — PROTIME-INR
INR: 1
PROTHROMBIN TIME: 13.4 s (ref 11.5–14.7)

## 2013-11-16 LAB — MRSA PCR SCREENING

## 2013-11-16 LAB — APTT: Activated PTT: 28.8 secs (ref 23.6–35.9)

## 2013-11-16 LAB — SEDIMENTATION RATE: ERYTHROCYTE SED RATE: 36 mm/h — AB (ref 0–30)

## 2013-11-17 ENCOUNTER — Telehealth: Payer: Self-pay

## 2013-11-17 NOTE — Telephone Encounter (Signed)
Spoke w/ Rodney Booze.  She states that she received last office note, but Dr. Thana Ates advises that she sched another f/u to obtain cardiac clearance for surgery.  Advised her that we have not received request for cardiac clearance for surgery.  She states that [facility] is trying to set this up and that will call them and advise them to send paperwork to [place].  She will call back if she needs any assistance w/ this.

## 2013-11-17 NOTE — Telephone Encounter (Signed)
Dr. Thana Ates nurse called wants medical clearance note/stress test results. Please call.

## 2013-11-18 LAB — URINE CULTURE

## 2013-11-24 ENCOUNTER — Ambulatory Visit: Payer: Self-pay | Admitting: Orthopedic Surgery

## 2013-11-25 LAB — PATHOLOGY REPORT

## 2014-01-31 ENCOUNTER — Emergency Department (HOSPITAL_COMMUNITY): Payer: BC Managed Care – PPO

## 2014-01-31 ENCOUNTER — Inpatient Hospital Stay (HOSPITAL_COMMUNITY)
Admission: EM | Admit: 2014-01-31 | Discharge: 2014-02-08 | DRG: 300 | Disposition: A | Discharge status: Home / Self Care | Payer: BC Managed Care – PPO | Attending: Thoracic Surgery (Cardiothoracic Vascular Surgery) | Admitting: Thoracic Surgery (Cardiothoracic Vascular Surgery)

## 2014-01-31 ENCOUNTER — Encounter (HOSPITAL_COMMUNITY): Payer: Self-pay | Admitting: Emergency Medicine

## 2014-01-31 ENCOUNTER — Emergency Department: Payer: Self-pay | Admitting: Internal Medicine

## 2014-01-31 DIAGNOSIS — R0681 Apnea, not elsewhere classified: Secondary | ICD-10-CM | POA: Diagnosis present

## 2014-01-31 DIAGNOSIS — E119 Type 2 diabetes mellitus without complications: Secondary | ICD-10-CM | POA: Diagnosis present

## 2014-01-31 DIAGNOSIS — Z794 Long term (current) use of insulin: Secondary | ICD-10-CM

## 2014-01-31 DIAGNOSIS — N179 Acute kidney failure, unspecified: Secondary | ICD-10-CM | POA: Diagnosis not present

## 2014-01-31 DIAGNOSIS — Z8249 Family history of ischemic heart disease and other diseases of the circulatory system: Secondary | ICD-10-CM

## 2014-01-31 DIAGNOSIS — E669 Obesity, unspecified: Secondary | ICD-10-CM | POA: Diagnosis present

## 2014-01-31 DIAGNOSIS — E876 Hypokalemia: Secondary | ICD-10-CM | POA: Diagnosis present

## 2014-01-31 DIAGNOSIS — I5042 Chronic combined systolic (congestive) and diastolic (congestive) heart failure: Secondary | ICD-10-CM | POA: Diagnosis present

## 2014-01-31 DIAGNOSIS — Z79891 Long term (current) use of opiate analgesic: Secondary | ICD-10-CM

## 2014-01-31 DIAGNOSIS — A599 Trichomoniasis, unspecified: Secondary | ICD-10-CM | POA: Diagnosis present

## 2014-01-31 DIAGNOSIS — E780 Pure hypercholesterolemia: Secondary | ICD-10-CM | POA: Diagnosis present

## 2014-01-31 DIAGNOSIS — F1120 Opioid dependence, uncomplicated: Secondary | ICD-10-CM | POA: Diagnosis present

## 2014-01-31 DIAGNOSIS — T501X5A Adverse effect of loop [high-ceiling] diuretics, initial encounter: Secondary | ICD-10-CM | POA: Diagnosis present

## 2014-01-31 DIAGNOSIS — F17210 Nicotine dependence, cigarettes, uncomplicated: Secondary | ICD-10-CM | POA: Diagnosis present

## 2014-01-31 DIAGNOSIS — I7100 Dissection of unspecified site of aorta: Secondary | ICD-10-CM | POA: Diagnosis present

## 2014-01-31 DIAGNOSIS — I10 Essential (primary) hypertension: Secondary | ICD-10-CM | POA: Diagnosis present

## 2014-01-31 DIAGNOSIS — I429 Cardiomyopathy, unspecified: Secondary | ICD-10-CM | POA: Diagnosis present

## 2014-01-31 DIAGNOSIS — E875 Hyperkalemia: Secondary | ICD-10-CM | POA: Diagnosis present

## 2014-01-31 DIAGNOSIS — Z8674 Personal history of sudden cardiac arrest: Secondary | ICD-10-CM | POA: Diagnosis not present

## 2014-01-31 DIAGNOSIS — D649 Anemia, unspecified: Secondary | ICD-10-CM | POA: Diagnosis present

## 2014-01-31 DIAGNOSIS — R5383 Other fatigue: Secondary | ICD-10-CM | POA: Diagnosis present

## 2014-01-31 DIAGNOSIS — M549 Dorsalgia, unspecified: Secondary | ICD-10-CM | POA: Diagnosis present

## 2014-01-31 DIAGNOSIS — I71019 Dissection of thoracic aorta, unspecified: Secondary | ICD-10-CM | POA: Diagnosis present

## 2014-01-31 DIAGNOSIS — I7101 Dissection of thoracic aorta: Principal | ICD-10-CM | POA: Diagnosis present

## 2014-01-31 DIAGNOSIS — G8929 Other chronic pain: Secondary | ICD-10-CM | POA: Diagnosis present

## 2014-01-31 DIAGNOSIS — R4182 Altered mental status, unspecified: Secondary | ICD-10-CM

## 2014-01-31 DIAGNOSIS — Z6825 Body mass index (BMI) 25.0-25.9, adult: Secondary | ICD-10-CM | POA: Diagnosis not present

## 2014-01-31 DIAGNOSIS — R451 Restlessness and agitation: Secondary | ICD-10-CM | POA: Diagnosis not present

## 2014-01-31 DIAGNOSIS — I71012 Dissection of descending thoracic aorta: Secondary | ICD-10-CM

## 2014-01-31 DIAGNOSIS — Z91041 Radiographic dye allergy status: Secondary | ICD-10-CM | POA: Diagnosis not present

## 2014-01-31 DIAGNOSIS — R410 Disorientation, unspecified: Secondary | ICD-10-CM | POA: Diagnosis present

## 2014-01-31 DIAGNOSIS — I7102 Dissection of abdominal aorta: Secondary | ICD-10-CM

## 2014-01-31 LAB — TROPONIN I
TROPONIN-I: 0.08 ng/mL — AB
TROPONIN-I: 0.09 ng/mL — AB
Troponin I: <0.3 ng/mL (ref <0.30)

## 2014-01-31 LAB — CBC WITH DIFFERENTIAL/PLATELET
Basophils Absolute: 0 10*3/uL (ref 0.0–0.1)
Basophils Relative: 0 % (ref 0–1)
Eosinophils Absolute: 0 10*3/uL (ref 0.0–0.7)
Eosinophils Relative: 0 % (ref 0–5)
HEMATOCRIT: 32 % — AB (ref 36.0–46.0)
HEMOGLOBIN: 10.2 g/dL — AB (ref 12.0–15.0)
LYMPHS ABS: 0.4 10*3/uL — AB (ref 0.7–4.0)
LYMPHS PCT: 4 % — AB (ref 12–46)
MCH: 26.8 pg (ref 26.0–34.0)
MCHC: 31.9 g/dL (ref 30.0–36.0)
MCV: 84 fL (ref 78.0–100.0)
MONO ABS: 0.2 10*3/uL (ref 0.1–1.0)
Monocytes Relative: 2 % — ABNORMAL LOW (ref 3–12)
Neutro Abs: 9.3 10*3/uL — ABNORMAL HIGH (ref 1.7–7.7)
Neutrophils Relative %: 94 % — ABNORMAL HIGH (ref 43–77)
Platelets: 277 10*3/uL (ref 150–400)
RBC: 3.81 MIL/uL — AB (ref 3.87–5.11)
RDW: 15.3 % (ref 11.5–15.5)
WBC: 10 10*3/uL (ref 4.0–10.5)

## 2014-01-31 LAB — COMPREHENSIVE METABOLIC PANEL
ALBUMIN: 3.7 g/dL (ref 3.4–5.0)
ALK PHOS: 80 U/L
ALT: 13 U/L (ref 0–35)
AST: 19 U/L (ref 0–37)
Albumin: 3.1 g/dL — ABNORMAL LOW (ref 3.5–5.2)
Alkaline Phosphatase: 65 U/L (ref 39–117)
Anion Gap: 8 (ref 7–16)
Anion gap: 13 (ref 5–15)
BILIRUBIN TOTAL: 0.4 mg/dL (ref 0.3–1.2)
BUN: 20 mg/dL — ABNORMAL HIGH (ref 7–18)
BUN: 19 mg/dL (ref 6–23)
Bilirubin,Total: 0.5 mg/dL (ref 0.2–1.0)
CALCIUM: 8.6 mg/dL (ref 8.5–10.1)
CALCIUM: 8.4 mg/dL (ref 8.4–10.5)
CHLORIDE: 99 meq/L (ref 96–112)
CO2: 30 mmol/L (ref 21–32)
CO2: 27 meq/L (ref 19–32)
CREATININE: 1.09 mg/dL (ref 0.50–1.10)
Chloride: 100 mmol/L (ref 98–107)
Creatinine: 1.33 mg/dL — ABNORMAL HIGH (ref 0.60–1.30)
EGFR (African American): 53 — ABNORMAL LOW
EGFR (Non-African Amer.): 44 — ABNORMAL LOW
GFR calc non Af Amer: 56 mL/min — ABNORMAL LOW (ref >90)
GFR, EST AFRICAN AMERICAN: 65 mL/min — AB (ref >90)
Glucose, Bld: 142 mg/dL — ABNORMAL HIGH (ref 70–99)
Glucose: 106 mg/dL — ABNORMAL HIGH (ref 65–99)
Osmolality: 279 (ref 275–301)
Potassium: 3 mmol/L — ABNORMAL LOW (ref 3.5–5.1)
Potassium: 3.8 mEq/L (ref 3.7–5.3)
SGOT(AST): 16 U/L (ref 15–37)
SGPT (ALT): 20 U/L
Sodium: 138 mmol/L (ref 136–145)
Sodium: 139 mEq/L (ref 137–147)
Total Protein: 7.1 g/dL (ref 6.4–8.2)
Total Protein: 6.3 g/dL (ref 6.0–8.3)

## 2014-01-31 LAB — URINALYSIS, COMPLETE
BACTERIA: NONE SEEN
BLOOD: NEGATIVE
Bilirubin,UR: NEGATIVE
Glucose,UR: NEGATIVE mg/dL (ref 0–75)
KETONE: NEGATIVE
Leukocyte Esterase: NEGATIVE
Nitrite: NEGATIVE
PROTEIN: NEGATIVE
Ph: 7 (ref 4.5–8.0)
RBC,UR: 1 /HPF (ref 0–5)
SPECIFIC GRAVITY: 1.012 (ref 1.003–1.030)
Squamous Epithelial: 1
WBC UR: 4 /HPF (ref 0–5)

## 2014-01-31 LAB — CBC
HCT: 34 % — ABNORMAL LOW (ref 35.0–47.0)
HGB: 10.9 g/dL — ABNORMAL LOW (ref 12.0–16.0)
MCH: 27 pg (ref 26.0–34.0)
MCHC: 32 g/dL (ref 32.0–36.0)
MCV: 84 fL (ref 80–100)
Platelet: 346 10*3/uL (ref 150–440)
RBC: 4.03 10*6/uL (ref 3.80–5.20)
RDW: 15.8 % — ABNORMAL HIGH (ref 11.5–14.5)
WBC: 12.1 10*3/uL — ABNORMAL HIGH (ref 3.6–11.0)

## 2014-01-31 LAB — GLUCOSE, CAPILLARY: Glucose-Capillary: 132 mg/dL — ABNORMAL HIGH (ref 70–99)

## 2014-01-31 LAB — PRO B NATRIURETIC PEPTIDE: B-TYPE NATIURETIC PEPTID: 1673 pg/mL — AB (ref 0–125)

## 2014-01-31 LAB — D-DIMER(ARMC)

## 2014-01-31 MED ORDER — MOMETASONE FURO-FORMOTEROL FUM 100-5 MCG/ACT IN AERO
2.0000 | INHALATION_SPRAY | Freq: Two times a day (BID) | RESPIRATORY_TRACT | Status: DC
  Administered 2014-01-31 – 2014-02-08 (×14): 2 via RESPIRATORY_TRACT
  Filled 2014-01-31 (×2): qty 8.8

## 2014-01-31 MED ORDER — SODIUM CHLORIDE 0.9 % IV SOLN: INTRAVENOUS | Status: DC | PRN

## 2014-01-31 MED ORDER — LEVALBUTEROL HCL 0.63 MG/3ML IN NEBU
0.6300 mg | INHALATION_SOLUTION | Freq: Four times a day (QID) | RESPIRATORY_TRACT | Status: DC | PRN
Start: 2014-01-31 — End: 2014-02-08

## 2014-01-31 MED ORDER — ADULT MULTIVITAMIN W/MINERALS CH
1.0000 | ORAL_TABLET | Freq: Every day | ORAL | Status: DC
Start: 2014-01-31 — End: 2014-02-08
  Administered 2014-02-01 – 2014-02-08 (×8): 1 via ORAL
  Filled 2014-01-31 (×9): qty 1

## 2014-01-31 MED ORDER — HYDROMORPHONE HCL 1 MG/ML IJ SOLN
2.0000 mg | INTRAMUSCULAR | Status: DC | PRN
  Administered 2014-02-06: 2 mg via INTRAVENOUS
  Filled 2014-01-31: qty 2

## 2014-01-31 MED ORDER — CYCLOBENZAPRINE HCL 10 MG PO TABS
10.0000 mg | ORAL_TABLET | Freq: Every day | ORAL | Status: DC | PRN
  Administered 2014-02-06: 10 mg via ORAL
  Filled 2014-01-31 (×2): qty 1

## 2014-01-31 MED ORDER — FUROSEMIDE 80 MG PO TABS
80.0000 mg | ORAL_TABLET | Freq: Two times a day (BID) | ORAL | Status: DC
  Administered 2014-02-01 – 2014-02-02 (×2): 80 mg via ORAL
  Filled 2014-01-31 (×5): qty 1

## 2014-01-31 MED ORDER — SODIUM CHLORIDE 0.9 % IV SOLN
INTRAVENOUS | Status: DC
  Administered 2014-01-31: 17:00:00 via INTRAVENOUS

## 2014-01-31 MED ORDER — KCL IN DEXTROSE-NACL 10-5-0.45 MEQ/L-%-% IV SOLN
INTRAVENOUS | Status: DC
  Administered 2014-01-31: 1 mL via INTRAVENOUS
  Administered 2014-02-01: 75 mL/h via INTRAVENOUS
  Administered 2014-02-02: 1 mL via INTRAVENOUS
  Filled 2014-01-31 (×6): qty 1000

## 2014-01-31 MED ORDER — AMLODIPINE BESYLATE 5 MG PO TABS
5.0000 mg | ORAL_TABLET | Freq: Every day | ORAL | Status: DC
  Administered 2014-02-01 – 2014-02-04 (×4): 5 mg via ORAL
  Filled 2014-01-31 (×6): qty 1

## 2014-01-31 MED ORDER — HYDRALAZINE HCL 50 MG PO TABS
100.0000 mg | ORAL_TABLET | Freq: Three times a day (TID) | ORAL | Status: DC
  Administered 2014-01-31 – 2014-02-08 (×20): 100 mg via ORAL
  Filled 2014-01-31 (×25): qty 2

## 2014-01-31 MED ORDER — LABETALOL HCL 5 MG/ML IV SOLN
0.5000 mg/min | INTRAVENOUS | Status: DC
  Administered 2014-01-31: 0.5 mg/min via INTRAVENOUS
  Administered 2014-02-01: 5 mg/min via INTRAVENOUS
  Filled 2014-01-31 (×2): qty 100

## 2014-01-31 MED ORDER — POTASSIUM CHLORIDE ER 10 MEQ PO TBCR
10.0000 meq | EXTENDED_RELEASE_TABLET | Freq: Every day | ORAL | Status: DC
  Filled 2014-01-31: qty 1

## 2014-01-31 MED ORDER — HYDROCODONE-ACETAMINOPHEN 5-325 MG PO TABS
1.0000 | ORAL_TABLET | Freq: Four times a day (QID) | ORAL | Status: DC | PRN
  Administered 2014-02-02 – 2014-02-06 (×5): 2 via ORAL
  Filled 2014-01-31 (×3): qty 2
  Filled 2014-01-31: qty 1
  Filled 2014-01-31 (×2): qty 2

## 2014-01-31 MED ORDER — SENNOSIDES-DOCUSATE SODIUM 8.6-50 MG PO TABS
1.0000 | ORAL_TABLET | Freq: Two times a day (BID) | ORAL | Status: DC
  Administered 2014-01-31 – 2014-02-08 (×15): 1 via ORAL
  Filled 2014-01-31 (×18): qty 1

## 2014-01-31 MED ORDER — ONDANSETRON HCL 4 MG PO TABS
8.0000 mg | ORAL_TABLET | Freq: Three times a day (TID) | ORAL | Status: DC | PRN
  Administered 2014-02-02 – 2014-02-05 (×2): 8 mg via ORAL
  Filled 2014-01-31 (×2): qty 2

## 2014-01-31 MED ORDER — FAMOTIDINE 20 MG PO TABS
20.0000 mg | ORAL_TABLET | Freq: Every day | ORAL | Status: DC
  Administered 2014-01-31 – 2014-02-08 (×8): 20 mg via ORAL
  Filled 2014-01-31 (×9): qty 1

## 2014-01-31 MED ORDER — CARVEDILOL 25 MG PO TABS
25.0000 mg | ORAL_TABLET | Freq: Two times a day (BID) | ORAL | Status: DC
  Administered 2014-02-02 – 2014-02-04 (×4): 25 mg via ORAL
  Filled 2014-01-31 (×9): qty 1

## 2014-01-31 MED ORDER — OXYCODONE HCL 5 MG PO TABS
10.0000 mg | ORAL_TABLET | ORAL | Status: DC | PRN
  Administered 2014-02-02 – 2014-02-08 (×18): 10 mg via ORAL
  Filled 2014-01-31 (×19): qty 2

## 2014-01-31 MED ORDER — MONTELUKAST SODIUM 10 MG PO TABS
10.0000 mg | ORAL_TABLET | Freq: Every day | ORAL | Status: DC
  Administered 2014-01-31 – 2014-02-07 (×8): 10 mg via ORAL
  Filled 2014-01-31 (×9): qty 1

## 2014-01-31 NOTE — ED Notes (Signed)
Patient transported to MRI 
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
## 2014-01-31 NOTE — ED Notes (Signed)
Cardiothoracic at [facility].

## 2014-01-31 NOTE — ED Notes (Signed)
Pt in MRI.
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
## 2014-01-31 NOTE — ED Notes (Signed)
BP checked on both sides. Right upper arm: 115/69. Left lower arm (due to IV placement): 116/72.

## 2014-01-31 NOTE — ED Notes (Signed)
Pt here from [place] with symptoms of aortic aneurysm. Pt originally present to [place] with CP this AM and was combative, writhing in pain. Pt was given 1 of dilaudid and 1 of ativan. Pt then stopped breathing and was given narcan. Pt then began writhing in pain again and pulled out 2 IVs. Pt was given 4 of morphine and 1 ativan. Pt is not lethargic, but arousible and responsive to painful stimuli. Pt has hx of HTN, initial BP at  was 220/120. Pt wqaws given a 40 labetolol bolus and is currently on 7.5 continuous. Pt is snoring and drooling at this time. VSS.

## 2014-01-31 NOTE — ED Notes (Signed)
Attempted to give report 

## 2014-02-01 ENCOUNTER — Inpatient Hospital Stay (HOSPITAL_COMMUNITY): Payer: BC Managed Care – PPO

## 2014-02-01 DIAGNOSIS — I7101 Dissection of thoracic aorta: Principal | ICD-10-CM

## 2014-02-01 DIAGNOSIS — I429 Cardiomyopathy, unspecified: Secondary | ICD-10-CM

## 2014-02-01 DIAGNOSIS — I10 Essential (primary) hypertension: Secondary | ICD-10-CM

## 2014-02-01 LAB — CBC
HCT: 29.2 % — ABNORMAL LOW (ref 36.0–46.0)
Hemoglobin: 9.5 g/dL — ABNORMAL LOW (ref 12.0–15.0)
MCH: 27.1 pg (ref 26.0–34.0)
MCHC: 32.5 g/dL (ref 30.0–36.0)
MCV: 83.4 fL (ref 78.0–100.0)
PLATELETS: 268 10*3/uL (ref 150–400)
RBC: 3.5 MIL/uL — AB (ref 3.87–5.11)
RDW: 15.2 % (ref 11.5–15.5)
WBC: 11.6 10*3/uL — AB (ref 4.0–10.5)

## 2014-02-01 LAB — GLUCOSE, CAPILLARY
Glucose-Capillary: 131 mg/dL — ABNORMAL HIGH (ref 70–99)
Glucose-Capillary: 117 mg/dL — ABNORMAL HIGH (ref 70–99)
Glucose-Capillary: 125 mg/dL — ABNORMAL HIGH (ref 70–99)
Glucose-Capillary: 121 mg/dL — ABNORMAL HIGH (ref 70–99)

## 2014-02-01 LAB — COMPREHENSIVE METABOLIC PANEL
ALT: 9 U/L (ref 0–35)
AST: 14 U/L (ref 0–37)
Albumin: 2.7 g/dL — ABNORMAL LOW (ref 3.5–5.2)
Alkaline Phosphatase: 56 U/L (ref 39–117)
Anion gap: 12 (ref 5–15)
BILIRUBIN TOTAL: 0.5 mg/dL (ref 0.3–1.2)
BUN: 26 mg/dL — ABNORMAL HIGH (ref 6–23)
CHLORIDE: 97 meq/L (ref 96–112)
CO2: 28 meq/L (ref 19–32)
Calcium: 8.1 mg/dL — ABNORMAL LOW (ref 8.4–10.5)
Creatinine, Ser: 1.29 mg/dL — ABNORMAL HIGH (ref 0.50–1.10)
GFR calc Af Amer: 53 mL/min — ABNORMAL LOW (ref >90)
GFR calc non Af Amer: 46 mL/min — ABNORMAL LOW (ref >90)
Glucose, Bld: 133 mg/dL — ABNORMAL HIGH (ref 70–99)
Potassium: 3.4 mEq/L — ABNORMAL LOW (ref 3.7–5.3)
SODIUM: 137 meq/L (ref 137–147)
Total Protein: 5.6 g/dL — ABNORMAL LOW (ref 6.0–8.3)

## 2014-02-01 LAB — MRSA PCR SCREENING: MRSA BY PCR: NEGATIVE

## 2014-02-01 MED ORDER — INSULIN ASPART 100 UNIT/ML ~~LOC~~ SOLN
0.0000 [IU] | Freq: Three times a day (TID) | SUBCUTANEOUS | Status: DC
Start: 2014-02-01 — End: 2014-02-08
  Administered 2014-02-01: 2 [IU] via SUBCUTANEOUS

## 2014-02-01 MED ORDER — POTASSIUM CHLORIDE CRYS ER 20 MEQ PO TBCR
20.0000 meq | EXTENDED_RELEASE_TABLET | Freq: Once | ORAL | Status: AC
  Administered 2014-02-01: 20 meq via ORAL
  Filled 2014-02-01: qty 1

## 2014-02-01 MED ORDER — POTASSIUM CHLORIDE ER 10 MEQ PO TBCR
40.0000 meq | EXTENDED_RELEASE_TABLET | Freq: Every day | ORAL | Status: DC
  Administered 2014-02-01: 40 meq via ORAL
  Filled 2014-02-01 (×2): qty 4

## 2014-02-01 MED ORDER — CETYLPYRIDINIUM CHLORIDE 0.05 % MT LIQD
7.0000 mL | Freq: Two times a day (BID) | OROMUCOSAL | Status: DC
  Administered 2014-02-01 – 2014-02-04 (×5): 7 mL via OROMUCOSAL

## 2014-02-01 MED ORDER — LABETALOL HCL 5 MG/ML IV SOLN
0.5000 mg/min | INTRAVENOUS | Status: DC
  Administered 2014-02-01: 5 mg/min via INTRAVENOUS
  Administered 2014-02-03: 4 mg/min via INTRAVENOUS
  Filled 2014-02-01 (×6): qty 200

## 2014-02-01 NOTE — Progress Notes (Signed)
CT surgery p.m. Rounds  Resting comfortably, blood pressure well controlled Head CT scan today without evidence of stroke Making adequate urine Continue current care

## 2014-02-01 NOTE — Procedures (Signed)
Arterial Catheter Insertion Procedure Note Lisa Leblanc [id number] [birth year]  Procedure: Insertion of Arterial Catheter  Indications: Blood pressure monitoring and Frequent blood sampling  Procedure Details Consent: Risks of procedure as well as the alternatives and risks of each were explained to the (patient/caregiver).  Consent for procedure obtained. Time Out: Verified patient identification, verified procedure, site/side was marked, verified correct patient position, special equipment/implants available, medications/allergies/relevent history reviewed, required imaging and test results available.  Performed  Maximum sterile technique was used including antiseptics, cap, gloves, gown, hand hygiene, mask and sheet. Skin prep: Chlorhexidine; local anesthetic administered 20 gauge catheter was inserted into right radial artery using the Seldinger technique.  Evaluation Blood flow good; BP tracing good. Complications: No apparent complications.   Loma Boston 02/01/2014

## 2014-02-01 NOTE — Progress Notes (Signed)
  Subjective: Lethargic this AM Per RN she was more alert and neuro intact earlier She denies pain at present  Objective: Vital signs in last 24 hours: Temp:  [98.9 F (37.2 C)-100.4 F (38 C)] 99.9 F (37.7 C) (11/11 0715) Pulse Rate:  [83-101] 83 (11/11 0700) Cardiac Rhythm:  [-] Normal sinus rhythm (11/11 0600) Resp:  [16-26] 19 (11/11 0700) BP: (92-157)/(55-95) 92/55 mmHg (11/11 0700) SpO2:  [87 %-100 %] 96 % (11/11 0700) Arterial Line BP: (97-165)/(54-74) 97/55 mmHg (11/11 0700) Weight:  [149 lb 8 oz (67.813 kg)] 149 lb 8 oz (67.813 kg) (11/11 0000)  Hemodynamic parameters for last 24 hours:    Intake/Output from previous day: 11/10 0701 - 11/11 0700 In: 1004.6 [I.V.:1004.6] Out: 300 [Urine:300] Intake/Output this shift:    General appearance: cooperative Neurologic: lethargic with no focal deficit Heart: regular rate and rhythm Lungs: coarse BS bilaterally 2+ pulses throughout  Lab Results:  Recent Labs  01/31/14 1617 02/01/14 0345  WBC 10.0 11.6*  HGB 10.2* 9.5*  HCT 32.0* 29.2*  PLT 277 268   BMET:  Recent Labs  01/31/14 1617 02/01/14 0345  NA 139 137  K 3.8 3.4*  CL 99 97  CO2 27 28  GLUCOSE 142* 133*  BUN 19 26*  CREATININE 1.09 1.29*  CALCIUM 8.4 8.1*    PT/INR: No results for input(s): LABPROT, INR in the last 72 hours. ABG    Component Value Date/Time   TCO2 35 11/20/2012 1908   CBG (last 3)   Recent Labs  01/31/14 2345  GLUCAP 132*    Assessment/Plan: S/P   Type III dissection  BP management- on labetalol drip, BP currently a little low with systolic in mid 90s- goal 100- 120. Hold PO meds, titrate labetalol CBG / SSI AC and HS SCD for DVT prophylaxis- NO lovenox/ heparin in setting of acute dissection Advance diet Mobilize Creatinine up slightly- may be BP related- follow   LOS: 1 day    Cashius Grandstaff C 02/01/2014

## 2014-02-01 NOTE — Progress Notes (Signed)
She remains lethargic  BP well controlled  Her exam is nonfocal when awakened but she falls back asleep almost instantly  Will get non contrast CT of head to rule out stroke, which is a distinct possibility given her hypertensive crisis yesterday

## 2014-02-01 NOTE — Progress Notes (Signed)
eLink Physician-Brief Progress Note Patient Name: Lisa Leblanc DOB: April 02, 1960 MRN: [id number]   Date of Service  02/01/2014  HPI/Events of Note  53 yo woman w HTN, chronic pain and narcs, NICM. Admitted with Type III aortic dissection. Starting labetalol for BP control. Appears to be sleeping comfortably currently. Being evaluated for thoracic surgery.   eICU Interventions  No intervention at this time.      Intervention Category Evaluation Type: New Patient Evaluation  Laurielle Selmon S. 02/01/2014, 12:03 AM

## 2014-02-01 NOTE — Consult Note (Signed)
CARDIOLOGY CONSULT NOTE  Patient ID: Camelia EngGloria H Pickrel MRN: [id number] DOB/AGE: [birth year] 55 y.o.  Admit date: 01/31/2014 Primary Physician: Charlette CaffeyMorrissey Primary Cardiologist: Mariah MillingGollan Reason for Consultation: BP control, Type B dissection  HPI: 53 yo with history of resistant HTN, presumed nonischemic cardiomyopathy (thought to be related to HTN), and smoking presented to ER at [facility] with severe back pain.  She has had a history of chronic back pain on narcotics.  She was initially treated in the ER with narcotics and ativan and became apneic.  She ended up getting Narcan.  She then had a noncontrast CT of her chest that was concerning for possible aortic dissection.  [facility] and [facility] M[facility] chest showing Type B aortic dissection originating just beyond the left subclavian.  She was started on labetalol gtt for BP control.  BP is actually well-controlled at this point with SBP in 110s, she is off labetalol gtt and has started her po meds.   This morning, patient is sedated.  She answers yes/no questions but I cannot get much else from her. She denies back or chest pain currently.  No dyspnea.   Review of systems complete and found to be negative unless listed above in HPI  Past Medical History: 1. HTN: Poor control historically.  2. Chronic low back pain. 3. Active smoker 4. Type II diabetes 5. Cardiomyopathy: Presumed nonischemic, due to HTN.  - Myoview 6/14 with EF 51%, no ischemia.  - Echo (3/15) with EF 30-35%, moderate LV hypertrophy. - Cardiolite (7/15) [facility] with EF 44%, no ischemia.  6. Type B aortic dissection 11/15. 7. Anemia  Family History  Problem Relation Age of Onset  . Diabetes type II Brother   . Diabetes Mother   . Hypertension Mother   . Diabetes Brother   . Colon cancer Sister   . Colon cancer Maternal Aunt     x 3  . Esophageal cancer Neg Hx   . Rectal cancer Neg Hx   . Stomach cancer Neg Hx   . Hypertension Father     History   Social  History  . Marital Status: Married    Spouse Name: N/A    Number of Children: 1  . Years of Education: N/A   Occupational History  . 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 Smoking status: Current Every Day Smoker -- 0.50 packs/day for 30 years    Types: Cigarettes  . Smokeless tobacco: Never Used     Comment: SMOKES LESS THAN 1 PK DAILY. Marland Kitchen.SMOKED 1 PK FOR 2--30 YRS   . Alcohol Use: No  . Drug Use: No  . Sexual Activity: No   Other Topics Concern  . Not on file   Social History Narrative   Lives with her husband and her son.  Works at [employer].       Prescriptions prior to admission  Medication Sig Dispense Refill Last Dose  . amLODipine (NORVASC) 5 MG tablet Take 5 mg by mouth daily.   01/31/2014 at Unknown time  . Biotin 10 MG CAPS Take by mouth daily.   01/31/2014 at unknown  . carvedilol (COREG) 25 MG tablet Take 25 mg by mouth 2 (two) times daily with a meal. Also take labetalol  per husband   01/31/2014 at 0800  . Cholecalciferol (VITAMIN D-3 PO) Take 1,000 mg by mouth daily.   01/31/2014 at Unknown time  .  cyclobenzaprine (FLEXERIL) 10 MG tablet Take 10 mg by mouth daily as needed for muscle spasms.    Past Week at Unknown time  . furosemide (LASIX) 80 MG tablet Take 1 tablet (80 mg total) by mouth 2 (two) times daily. 60 tablet 6 01/31/2014 at Unknown time  . hydrALAZINE (APRESOLINE) 100 MG tablet Take 1 tablet (100 mg total) by mouth 3 (three) times daily. (Patient taking differently: Take 100 mg by mouth 4 (four) times daily. ) 90 tablet 6 01/31/2014 at Unknown time  . HYDROcodone-acetaminophen (NORCO/VICODIN) 5-325 MG per tablet Take 1-2 tablets by mouth every 6 (six) hours as needed for moderate pain.   01/31/2014 at Unknown time  . labetalol (NORMODYNE) 300 MG tablet Take 300 mg by mouth 3 (three) times daily.   01/31/2014 at 0800  . Linaclotide (LINZESS) 145 MCG CAPS capsule Take 145 mcg by mouth daily.   01/31/2014  at Unknown time  . lisinopril-hydrochlorothiazide (PRINZIDE,ZESTORETIC) 20-12.5 MG per tablet Take 1 tablet by mouth daily.   01/31/2014 at Unknown time  . mometasone-formoterol (DULERA) 100-5 MCG/ACT AERO Inhale 2 puffs into the lungs as needed for wheezing.   unknown at unknown  . montelukast (SINGULAIR) 10 MG tablet Take 10 mg by mouth at bedtime.   01/30/2014 at Unknown time  . Multiple Vitamin (MULTIVITAMIN WITH MINERALS) TABS tablet Take 1 tablet by mouth daily.   01/31/2014 at Unknown time  . ondansetron (ZOFRAN) 8 MG tablet Take 8 mg by mouth every 8 (eight) hours as needed for nausea or vomiting.   unknown at Unknown time  . Oxycodone HCl 20 MG TABS Take by mouth as needed.   unknown at unknown  . potassium chloride (K-DUR) 10 MEQ tablet Take 1 tablet (10 mEq total) by mouth daily. 30 tablet 6 01/31/2014 at Unknown time  . ranitidine (ZANTAC) 75 MG tablet Take 75 mg by mouth daily.   01/31/2014 at Unknown time  . senna-docusate (SENOKOT-S) 8.6-50 MG per tablet Take 1 tablet by mouth 2 (two) times daily. 60 tablet 1 unknown at unknown  . albuterol (PROVENTIL HFA;VENTOLIN HFA) 108 (90 BASE) MCG/ACT inhaler Inhale 2 puffs into the lungs every 6 (six) hours as needed for wheezing or shortness of breath.   unknown at unknown  . lisinopril (PRINIVIL,ZESTRIL) 10 MG tablet Take 0.5 mg by mouth daily.   Taking   Current Scheduled Meds: . amLODipine  5 mg Oral Daily  . antiseptic oral rinse  7 mL Mouth Rinse BID  . carvedilol  25 mg Oral BID WC  . famotidine  20 mg Oral Daily  . furosemide  80 mg Oral BID  . hydrALAZINE  100 mg Oral TID  . insulin aspart  0-15 Units Subcutaneous TID WC  . mometasone-formoterol  2 puff Inhalation BID  . montelukast  10 mg Oral QHS  . multivitamin with minerals  1 tablet Oral Daily  . potassium chloride  40 mEq Oral Daily  . potassium chloride  20 mEq Oral Once  . senna-docusate  1 tablet Oral BID   Continuous Infusions: . dextrose 5 % and 0.45 % NaCl with  KCl 10 mEq/L 75 mL/hr at 02/01/14 0800  . labetalol (NORMODYNE) infusion Stopped (02/01/14 0900)   PRN Meds:.Place/Maintain arterial line **AND** sodium chloride, cyclobenzaprine, HYDROcodone-acetaminophen, HYDROmorphone (DILAUDID) injection, levalbuterol, ondansetron, oxyCODONE   Physical exam Blood pressure 97/60, pulse 84, temperature 99.9 F (37.7 C), temperature source Oral, resp. rate 22, height 5\' 4"  (1.626 m), weight 149 lb 8 oz (67.813 kg),  SpO2 99 %. General: NAD but very sedated.   Neck: No JVD, no thyromegaly or thyroid nodule.  Lungs: Clear to auscultation bilaterally with normal respiratory effort. CV: Nondisplaced PMI.  Heart regular S1/S2, no S3/S4, no murmur.  No peripheral edema.  No carotid bruit.  Normal pedal pulses.  Abdomen: Soft, nontender, no hepatosplenomegaly, no distention.  Skin: Intact without lesions or rashes.  Neurologic: Sedated but awakens and will answer yes/no questions and follow commands.  Extremities: No clubbing or cyanosis.  HEENT: Normal.   Labs:   Lab Results  Component Value Date   WBC 11.6* 02/01/2014   HGB 9.5* 02/01/2014   HCT 29.2* 02/01/2014   MCV 83.4 02/01/2014   PLT 268 02/01/2014    Recent Labs Lab 02/01/14 0345  NA 137  K 3.4*  CL 97  CO2 28  BUN 26*  CREATININE 1.29*  CALCIUM 8.1*  PROT 5.6*  BILITOT 0.5  ALKPHOS 56  ALT 9  AST 14  GLUCOSE 133*   Lab Results  Component Value Date   TROPONINI <0.30 01/31/2014   Radiology: - MRA chest: Type B dissection starting just beyond the origin of the left subclavian artery.  - CXR: peri-hilar congestion.   EKG: Not done yet  ASSESSMENT AND PLAN: 53 yo with history of resistant HTN, presumed nonischemic cardiomyopathy (thought to be related to HTN), and smoking presented to ER at [facility] with severe back pain.  She was found to have Type B aortic dissection.  1. Type B aortic dissection: Medical management at this time.  Needs excellent BP control.  Suspect  poorly-controlled BP is the etiology of her dissection.  - Now off labetalol gtt.  - She is on po Coreg, hydralazine, and amlodipine.  Hopefully this regimen will control her pressure.  - Additional imaging to assess for involvement of renal arteries may be helpful.  2. HTN: Resistant, likely cause of dissection.  Eventually would benefit from additional imaging of abdominal aorta to assess for involvement of renal arteries.  Continue current regimen as BP is now controlled.  3. Cardiomyopathy: Presumed nonischemic cardiomyopathy.  Will get echo today.  Last echo with EF 30-35%.  Continue Coreg, hydralazine/Imdur.  If creatinine stabilizes, would eventually consider ACEI.  She is not volume overloaded. She can continue her home Lasix for now but may need to hold if creatinine rises further.  4. AKI: Mild rise in creatinine above baseline.  As above, would consider imaging to assess origins of the renal arteries eventually.  Follow closely, may need to back off on Lasix if creatinine rises further.  5. Neuro: Very sedated.  She had narcotics last night but nothing so far today.   Marca Ancona 02/01/2014 10:15 AM

## 2014-02-02 ENCOUNTER — Inpatient Hospital Stay (HOSPITAL_COMMUNITY): Payer: BC Managed Care – PPO

## 2014-02-02 DIAGNOSIS — I517 Cardiomegaly: Secondary | ICD-10-CM

## 2014-02-02 LAB — GLUCOSE, CAPILLARY
Glucose-Capillary: 117 mg/dL — ABNORMAL HIGH (ref 70–99)
Glucose-Capillary: 108 mg/dL — ABNORMAL HIGH (ref 70–99)
Glucose-Capillary: 110 mg/dL — ABNORMAL HIGH (ref 70–99)
Glucose-Capillary: 92 mg/dL (ref 70–99)

## 2014-02-02 LAB — BASIC METABOLIC PANEL
ANION GAP: 13 (ref 5–15)
BUN: 25 mg/dL — ABNORMAL HIGH (ref 6–23)
CHLORIDE: 97 meq/L (ref 96–112)
CO2: 25 meq/L (ref 19–32)
Calcium: 8 mg/dL — ABNORMAL LOW (ref 8.4–10.5)
Creatinine, Ser: 1.4 mg/dL — ABNORMAL HIGH (ref 0.50–1.10)
GFR calc Af Amer: 48 mL/min — ABNORMAL LOW (ref >90)
GFR calc non Af Amer: 41 mL/min — ABNORMAL LOW (ref >90)
Glucose, Bld: 116 mg/dL — ABNORMAL HIGH (ref 70–99)
Potassium: 3.3 mEq/L — ABNORMAL LOW (ref 3.7–5.3)
Sodium: 135 mEq/L — ABNORMAL LOW (ref 137–147)

## 2014-02-02 LAB — URINALYSIS, ROUTINE W REFLEX MICROSCOPIC
BILIRUBIN URINE: NEGATIVE
GLUCOSE, UA: NEGATIVE mg/dL
Hgb urine dipstick: NEGATIVE
Ketones, ur: NEGATIVE mg/dL
Nitrite: NEGATIVE
Protein, ur: NEGATIVE mg/dL
Specific Gravity, Urine: 1.01 (ref 1.005–1.030)
Urobilinogen, UA: 1 mg/dL (ref 0.0–1.0)
pH: 6.5 (ref 5.0–8.0)

## 2014-02-02 LAB — CBC
HCT: 28.1 % — ABNORMAL LOW (ref 36.0–46.0)
Hemoglobin: 9.1 g/dL — ABNORMAL LOW (ref 12.0–15.0)
MCH: 26.8 pg (ref 26.0–34.0)
MCHC: 32.4 g/dL (ref 30.0–36.0)
MCV: 82.9 fL (ref 78.0–100.0)
PLATELETS: 267 10*3/uL (ref 150–400)
RBC: 3.39 MIL/uL — ABNORMAL LOW (ref 3.87–5.11)
RDW: 15.3 % (ref 11.5–15.5)
WBC: 10.8 10*3/uL — AB (ref 4.0–10.5)

## 2014-02-02 LAB — URINE MICROSCOPIC-ADD ON

## 2014-02-02 MED ORDER — FUROSEMIDE 40 MG PO TABS: 40.0000 mg | ORAL_TABLET | Freq: Two times a day (BID) | ORAL | Status: DC

## 2014-02-02 MED ORDER — SODIUM CHLORIDE 0.9 % IV SOLN
INTRAVENOUS | Status: DC | PRN
Start: 2014-02-02 — End: 2014-02-04

## 2014-02-02 MED ORDER — POTASSIUM CHLORIDE ER 10 MEQ PO TBCR
40.0000 meq | EXTENDED_RELEASE_TABLET | Freq: Two times a day (BID) | ORAL | Status: DC
  Administered 2014-02-02 – 2014-02-06 (×10): 40 meq via ORAL
  Filled 2014-02-02 (×12): qty 4

## 2014-02-02 MED ORDER — ACETAMINOPHEN 325 MG PO TABS
650.0000 mg | ORAL_TABLET | ORAL | Status: DC | PRN
  Administered 2014-02-02 – 2014-02-07 (×2): 650 mg via ORAL
  Filled 2014-02-02 (×2): qty 2

## 2014-02-02 MED ORDER — FUROSEMIDE 40 MG PO TABS
40.0000 mg | ORAL_TABLET | Freq: Every day | ORAL | Status: DC
  Administered 2014-02-03 – 2014-02-08 (×6): 40 mg via ORAL
  Filled 2014-02-02 (×6): qty 1

## 2014-02-02 NOTE — Progress Notes (Signed)
CTSP for fever of 102.8  She continues to have back pain. Says it is similar to the pain she has in her back chronically predating the dissection.  She rates pain 8/10 but does not show objective signs of severe pain. She just received 10 mg of oxycodone.  BP 83/43 mmHg  Pulse 87  Temp(Src) 102.6 F (39.2 C) (Oral)  Resp 25  Ht 5\' 4"  (1.626 m)  Wt 149 lb 8 oz (67.813 kg)  BMI 25.65 kg/m2  SpO2 98%  Her exam is unchanged. No source of fever is apparent  Will culture adn give tylenol. Will hold off on antibiotics unless a source becomes apparent

## 2014-02-02 NOTE — Progress Notes (Signed)
     SUBJECTIVE: Pt somnolent but answers questions. Still having chest pain. No SOB  BP 110/69 mmHg  Pulse 85  Temp(Src) 100.4 F (38 C) (Oral)  Resp 24  Ht 5\' 4"  (1.626 m)  Wt 149 lb 8 oz (67.813 kg)  BMI 25.65 kg/m2  SpO2 96%  Intake/Output Summary (Last 24 hours) at 02/02/14 0816 Last data filed at 02/02/14 0600  Gross per 24 hour  Intake 1916.25 ml  Output   1850 ml  Net  66.25 ml   PHYSICAL EXAM General: Well developed, well nourished, in no acute distress. Alert and oriented x 3.  Psych:  Flat affect, responds appropriately Neck: No JVD. No masses noted.  Lungs: Clear bilaterally with no wheezes or rhonci noted.  Heart: RRR with no murmurs noted. Abdomen: Bowel sounds are present. Soft, non-tender.  Extremities: No lower extremity edema.   LABS: Basic Metabolic Panel:  Recent Labs  36/64/40 0345 02/02/14 0400  NA 137 135*  K 3.4* 3.3*  CL 97 97  CO2 28 25  GLUCOSE 133* 116*  BUN 26* 25*  CREATININE 1.29* 1.40*  CALCIUM 8.1* 8.0*   CBC:  Recent Labs  01/31/14 1617 02/01/14 0345 02/02/14 0400  WBC 10.0 11.6* 10.8*  NEUTROABS 9.3*  --   --   HGB 10.2* 9.5* 9.1*  HCT 32.0* 29.2* 28.1*  MCV 84.0 83.4 82.9  PLT 277 268 267   Cardiac Enzymes:  Recent Labs  01/31/14 1617  TROPONINI <0.30    Current Meds: . amLODipine  5 mg Oral Daily  . antiseptic oral rinse  7 mL Mouth Rinse BID  . carvedilol  25 mg Oral BID WC  . famotidine  20 mg Oral Daily  . furosemide  40 mg Oral BID  . hydrALAZINE  100 mg Oral TID  . insulin aspart  0-15 Units Subcutaneous TID WC  . mometasone-formoterol  2 puff Inhalation BID  . montelukast  10 mg Oral QHS  . multivitamin with minerals  1 tablet Oral Daily  . potassium chloride  40 mEq Oral BID  . senna-docusate  1 tablet Oral BID    ASSESSMENT AND PLAN: 53 yo female with history of resistant HTN, presumed nonischemic cardiomyopathy and smoking presented to ER at [facility] with severe back pain and was found to  have Type B aortic dissection, transferred to CT surgery service at [facility].   1. Type B aortic dissection: Medical management at this time. BP is well controlled on current regimen. She is on po Coreg, Hydralazine, and Amlodipine. Additional imaging to assess for involvement of renal arteries may be helpful.   2. HTN, resistant: Now controlled. Likely cause of dissection. Eventually would benefit from additional imaging of abdominal aorta to assess for involvement of renal arteries. Continue current regimen.   3. Cardiomyopathy: Presumed nonischemic cardiomyopathy. Echo is pending. Last echo with EF 30-35%. Continue Coreg, Hydralazine and Imdur.   4. Acute renal insufficiency: Follow closely. Would lower Lasix to 40 mg once daily as she does not appear to be volume overloaded. Consider imaging to assess origins of the renal arteries eventually.   5. Hypokalemia: Replace today   MCALHANY,CHRISTOPHER  11/12/20158:16 AM

## 2014-02-02 NOTE — Progress Notes (Signed)
Echocardiogram 2D Echocardiogram has been performed.  Riddhi Grether 02/02/2014, 10:50 AM

## 2014-02-02 NOTE — Progress Notes (Signed)
  Subjective: Feels better today Still c/o pain primarily in back 8/10 subjectively Denies abdominal pain/ leg pain  Objective: Vital signs in last 24 hours: Temp:  [99.3 F (37.4 C)-100.9 F (38.3 C)] 100.4 F (38 C) (11/12 0731) Pulse Rate:  [84-93] 85 (11/12 0800) Cardiac Rhythm:  [-] Normal sinus rhythm (11/12 0600) Resp:  [19-28] 24 (11/12 0800) BP: (94-119)/(47-71) 110/69 mmHg (11/12 0800) SpO2:  [93 %-100 %] 96 % (11/12 0800) Arterial Line BP: (100-143)/(45-66) 124/58 mmHg (11/12 0800)  Hemodynamic parameters for last 24 hours:    Intake/Output from previous day: 11/11 0701 - 11/12 0700 In: 2036.3 [I.V.:2036.3] Out: 1850 [Urine:1850] Intake/Output this shift:    General appearance: alert and cooperative Neurologic: intact Heart: regular rate and rhythm Lungs: clear to auscultation bilaterally Abdomen: distended, nontender, +BS Extremities: well perfused  Lab Results:  Recent Labs  02/01/14 0345 02/02/14 0400  WBC 11.6* 10.8*  HGB 9.5* 9.1*  HCT 29.2* 28.1*  PLT 268 267   BMET:  Recent Labs  02/01/14 0345 02/02/14 0400  NA 137 135*  K 3.4* 3.3*  CL 97 97  CO2 28 25  GLUCOSE 133* 116*  BUN 26* 25*  CREATININE 1.29* 1.40*  CALCIUM 8.1* 8.0*    PT/INR: No results for input(s): LABPROT, INR in the last 72 hours. ABG    Component Value Date/Time   TCO2 35 11/20/2012 1908   CBG (last 3)   Recent Labs  02/01/14 1523 02/01/14 1945 02/02/14 0729  GLUCAP 125* 121* 117*    Assessment/Plan: S/P   -  Type III aortic dissection/ malignant hypertension- medical management BP well controlled with PO meds + labetalol drip Anemia-stable - no evidence of blood loss  Renal- Creatinine rising- likely acute kidney injury- will decrease lasix Hypokalemia- supplement  Pain control- she complains of pain being 8/10 but does not appear uncomfortable. Is hard to pin down given her history of chronic pain requiring narcotics. Continue PRN oxycodone  and dilaudid  SCD for DVT prophylaxis  ENDO- CBG well controlled  Altered mental status- head CT negative. She is much more alert today. Likely combination of BP management + pain meds.  Mobilize  LOS: 2 days    HENDRICKSON,STEVEN C 02/02/2014

## 2014-02-03 ENCOUNTER — Other Ambulatory Visit: Payer: Self-pay

## 2014-02-03 LAB — GLUCOSE, CAPILLARY
Glucose-Capillary: 102 mg/dL — ABNORMAL HIGH (ref 70–99)
Glucose-Capillary: 101 mg/dL — ABNORMAL HIGH (ref 70–99)
Glucose-Capillary: 91 mg/dL (ref 70–99)

## 2014-02-03 LAB — CBC
HEMATOCRIT: 29.5 % — AB (ref 36.0–46.0)
Hemoglobin: 9.4 g/dL — ABNORMAL LOW (ref 12.0–15.0)
MCH: 26.3 pg (ref 26.0–34.0)
MCHC: 31.9 g/dL (ref 30.0–36.0)
MCV: 82.6 fL (ref 78.0–100.0)
PLATELETS: 291 10*3/uL (ref 150–400)
RBC: 3.57 MIL/uL — ABNORMAL LOW (ref 3.87–5.11)
RDW: 15.2 % (ref 11.5–15.5)
WBC: 10.8 10*3/uL — ABNORMAL HIGH (ref 4.0–10.5)

## 2014-02-03 LAB — BASIC METABOLIC PANEL
ANION GAP: 15 (ref 5–15)
BUN: 26 mg/dL — ABNORMAL HIGH (ref 6–23)
CALCIUM: 8.4 mg/dL (ref 8.4–10.5)
CO2: 23 meq/L (ref 19–32)
Chloride: 99 mEq/L (ref 96–112)
Creatinine, Ser: 1.4 mg/dL — ABNORMAL HIGH (ref 0.50–1.10)
GFR calc non Af Amer: 41 mL/min — ABNORMAL LOW (ref >90)
GFR, EST AFRICAN AMERICAN: 48 mL/min — AB (ref >90)
Glucose, Bld: 94 mg/dL (ref 70–99)
Potassium: 3.7 mEq/L (ref 3.7–5.3)
Sodium: 137 mEq/L (ref 137–147)

## 2014-02-03 LAB — URINE CULTURE
Colony Count: 9000
Special Requests: NORMAL

## 2014-02-03 MED ORDER — METRONIDAZOLE 500 MG PO TABS
2000.0000 mg | ORAL_TABLET | Freq: Once | ORAL | Status: AC
  Administered 2014-02-03: 2000 mg via ORAL
  Filled 2014-02-03: qty 4

## 2014-02-03 MED ORDER — LABETALOL HCL 200 MG PO TABS
200.0000 mg | ORAL_TABLET | Freq: Three times a day (TID) | ORAL | Status: DC
  Administered 2014-02-03 – 2014-02-04 (×4): 200 mg via ORAL
  Filled 2014-02-03 (×6): qty 1

## 2014-02-03 MED ORDER — OXYCODONE HCL ER 20 MG PO T12A
20.0000 mg | EXTENDED_RELEASE_TABLET | Freq: Two times a day (BID) | ORAL | Status: DC
  Administered 2014-02-03 – 2014-02-06 (×7): 20 mg via ORAL
  Filled 2014-02-03: qty 2
  Filled 2014-02-03 (×3): qty 1
  Filled 2014-02-03: qty 2
  Filled 2014-02-03 (×2): qty 1

## 2014-02-03 NOTE — Progress Notes (Signed)
Pt complaining of new chest pain 9/10.  EKG performed indicating NSR.  Pt not in distress and dozes off to sleep.  Dr. Dorris Fetch called and informed.  Dr. Dorris Fetch advised to monitor at this time.  Will monitor pt closely.
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
## 2014-02-03 NOTE — Progress Notes (Addendum)
  Subjective: C/o back pain- c/w her chronic pain She did c/o some epigastric/ chest pain last night- none at present  Objective: Vital signs in last 24 hours: Temp:  [99.2 F (37.3 C)-102.6 F (39.2 C)] 99.2 F (37.3 C) (11/13 0400) Pulse Rate:  [80-93] 80 (11/13 0700) Cardiac Rhythm:  [-] Normal sinus rhythm (11/12 2000) Resp:  [13-31] 24 (11/13 0700) BP: (81-126)/(43-75) 111/63 mmHg (11/13 0700) SpO2:  [91 %-100 %] 94 % (11/13 0700) Arterial Line BP: (88-136)/(42-68) 129/59 mmHg (11/13 0600)  Hemodynamic parameters for last 24 hours:    Intake/Output from previous day: 11/12 0701 - 11/13 0700 In: 1002.5 [P.O.:340; I.V.:662.5] Out: 1165 [Urine:1165] Intake/Output this shift:    General appearance: alert and no distress Neurologic: intact Heart: regular rate and rhythm Lungs: clear to auscultation bilaterally Abdomen: distended(unchanged), mildly tender in epigastrium, umbilical hernia Extremities: well perfused  Lab Results:  Recent Labs  02/02/14 0400 02/03/14 0430  WBC 10.8* 10.8*  HGB 9.1* 9.4*  HCT 28.1* 29.5*  PLT 267 291   BMET:  Recent Labs  02/02/14 0400 02/03/14 0430  NA 135* 137  K 3.3* 3.7  CL 97 99  CO2 25 23  GLUCOSE 116* 94  BUN 25* 26*  CREATININE 1.40* 1.40*  CALCIUM 8.0* 8.4    PT/INR: No results for input(s): LABPROT, INR in the last 72 hours. ABG    Component Value Date/Time   TCO2 35 11/20/2012 1908   CBG (last 3)   Recent Labs  02/02/14 1134 02/02/14 1530 02/02/14 2149  GLUCAP 108* 110* 92    Assessment/Plan: S/P   -  Type III aortic dissection  Hypertension- on and off labetalol gtt overnight, back on since 3 AM  Her coreg was held last PM  Continue PO meds, add PO labetalol  Wean labetalol drip  RENAL_ acute kidney injury- creatinine stable  Anemia- stable  Chronic pain- resume oxycontin, continue PRN meds  DM well controlled  SCD for DVT prophylaxis, no enoxaparin in setting of acute  dissection  FEVER to 102 yesterday afternoon- no obvious source but does have trichomonas in urine- will treat with metronidazole 2000 mg x 1 per guidelines  LOS: 3 days    Lissett Favorite C 02/03/2014

## 2014-02-03 NOTE — Progress Notes (Signed)
Pt aroused to use BSC.  Pt stated her pain was "better".  Vital signs remain stable.  Will continue to monitor,

## 2014-02-03 NOTE — Progress Notes (Signed)
Pt set up for bath at approximately 2100.  This RN returned to room at 2130 to give meds.  Pt was noted to be asleep and bath supplies appeared to be untouched.  This RN asked pt if she was finished with her bath and she stated "yes".  This RN assisted pt with new gown.

## 2014-02-03 NOTE — Progress Notes (Signed)
SUBJECTIVE:  BP 103/61 mmHg  Pulse 80  Temp(Src) 99.2 F (37.3 C) (Oral)  Resp 19  Ht 5\' 4"  (1.626 m)  Wt 149 lb 8 oz (67.813 kg)  BMI 25.65 kg/m2  SpO2 97%  Intake/Output Summary (Last 24 hours) at 02/03/14 1029 Last data filed at 02/03/14 0800  Gross per 24 hour  Intake    765 ml  Output    790 ml  Net    -25 ml    PHYSICAL EXAM General: Well developed, well nourished, in no acute distress. Alert and oriented x 3.  Psych:  Good affect, responds appropriately Neck: No JVD. No masses noted.  Lungs: Clear bilaterally with no wheezes or rhonci noted.  Heart: RRR with no murmurs noted. Abdomen: Bowel sounds are present. Soft, non-tender.  Extremities: No lower extremity edema.   LABS: Basic Metabolic Panel:  Recent Labs  25/63/89 0400 02/03/14 0430  NA 135* 137  K 3.3* 3.7  CL 97 99  CO2 25 23  GLUCOSE 116* 94  BUN 25* 26*  CREATININE 1.40* 1.40*  CALCIUM 8.0* 8.4   CBC:  Recent Labs  01/31/14 1617  02/02/14 0400 02/03/14 0430  WBC 10.0  < > 10.8* 10.8*  NEUTROABS 9.3*  --   --   --   HGB 10.2*  < > 9.1* 9.4*  HCT 32.0*  < > 28.1* 29.5*  MCV 84.0  < > 82.9 82.6  PLT 277  < > 267 291  < > = values in this interval not displayed. Cardiac Enzymes:  Recent Labs  01/31/14 1617  TROPONINI <0.30   Current Meds: . amLODipine  5 mg Oral Daily  . antiseptic oral rinse  7 mL Mouth Rinse BID  . carvedilol  25 mg Oral BID WC  . famotidine  20 mg Oral Daily  . furosemide  40 mg Oral Daily  . hydrALAZINE  100 mg Oral TID  . insulin aspart  0-15 Units Subcutaneous TID WC  . labetalol  200 mg Oral TID  . mometasone-formoterol  2 puff Inhalation BID  . montelukast  10 mg Oral QHS  . multivitamin with minerals  1 tablet Oral Daily  . OxyCODONE  20 mg Oral Q12H  . potassium chloride  40 mEq Oral BID  . senna-docusate  1 tablet Oral BID   Echo 02/02/14: Left ventricle: The cavity size was normal. There was moderate concentric hypertrophy. Systolic  function was mildly reduced. The estimated ejection fraction was in the range of 45% to 50%. Mild diffuse hypokinesis with no identifiable regional variations. Global longitudinal strain -15.5%. Calculated EF 48%. Features are consistent with a pseudonormal left ventricular filling pattern, with concomitant abnormal relaxation and increased filling pressure (grade 2 diastolic dysfunction). - Left atrium: The atrium was moderately dilated.  ASSESSMENT AND PLAN: 53 yo female with history of resistant HTN, presumed nonischemic cardiomyopathy and smoking presented to ER at [facility] with severe back pain and was found to have Type B aortic dissection, transferred to CT surgery service at [facility].   1. Type B aortic dissection: Medical management at this time. BP is well controlled on current regimen. She is on po Coreg, Hydralazine, and Amlodipine and her BP has been well controlled. Systolic BP 130 last night so IV labetalol added, being weaned this am. Can titrate amlodipine as needed as well.   2. HTN, resistant: Controlled. Likely cause of dissection. Eventually would benefit from additional imaging of abdominal aorta to assess for involvement  of renal arteries. Continue current regimen.   3. Cardiomyopathy: Presumed nonischemic cardiomyopathy. Echo 02/02/14 with LVEF=45-59%. Continue Coreg, Hydralazine and Imdur.   4. Acute renal insufficiency: Stable. Continue Lasix to 40 mg once daily as she does not appear to be volume overloaded.     MCALHANY,CHRISTOPHER  11/13/201510:29 AM

## 2014-02-03 NOTE — Progress Notes (Signed)
Pt resting with eyes closed with no signs of distress.  Vital signs stable.  Will continue to monitor.

## 2014-02-03 NOTE — Progress Notes (Signed)
Summoned to pt room by aline disconnected alarm.  Pt has been very active in bed, tossing and turning.  Upon arrival to pt room aline was dislodged.  Pressure applied to pt right radial.  Catheter intact.  Small, soft hematoma noted.Bleeding stopped and pressure dressing applied.  Will continue to monitor.

## 2014-02-04 DIAGNOSIS — I7100 Dissection of unspecified site of aorta: Secondary | ICD-10-CM | POA: Insufficient documentation

## 2014-02-04 LAB — BASIC METABOLIC PANEL
ANION GAP: 15 (ref 5–15)
BUN: 27 mg/dL — ABNORMAL HIGH (ref 6–23)
CHLORIDE: 96 meq/L (ref 96–112)
CO2: 23 mEq/L (ref 19–32)
CREATININE: 1.3 mg/dL — AB (ref 0.50–1.10)
Calcium: 8.6 mg/dL (ref 8.4–10.5)
GFR calc non Af Amer: 45 mL/min — ABNORMAL LOW (ref >90)
GFR, EST AFRICAN AMERICAN: 53 mL/min — AB (ref >90)
Glucose, Bld: 90 mg/dL (ref 70–99)
Potassium: 3.9 mEq/L (ref 3.7–5.3)
Sodium: 134 mEq/L — ABNORMAL LOW (ref 137–147)

## 2014-02-04 LAB — CBC
HEMATOCRIT: 29.5 % — AB (ref 36.0–46.0)
Hemoglobin: 9.5 g/dL — ABNORMAL LOW (ref 12.0–15.0)
MCH: 27.4 pg (ref 26.0–34.0)
MCHC: 32.2 g/dL (ref 30.0–36.0)
MCV: 85 fL (ref 78.0–100.0)
PLATELETS: 331 10*3/uL (ref 150–400)
RBC: 3.47 MIL/uL — AB (ref 3.87–5.11)
RDW: 15.3 % (ref 11.5–15.5)
WBC: 9.4 10*3/uL (ref 4.0–10.5)

## 2014-02-04 LAB — GLUCOSE, CAPILLARY
GLUCOSE-CAPILLARY: 98 mg/dL (ref 70–99)
Glucose-Capillary: 91 mg/dL (ref 70–99)

## 2014-02-04 MED ORDER — LABETALOL HCL 5 MG/ML IV SOLN
10.0000 mg | INTRAVENOUS | Status: DC | PRN
  Administered 2014-02-07: 10 mg via INTRAVENOUS
  Filled 2014-02-04 (×3): qty 4

## 2014-02-04 MED ORDER — LABETALOL HCL 300 MG PO TABS
300.0000 mg | ORAL_TABLET | Freq: Three times a day (TID) | ORAL | Status: DC
  Administered 2014-02-04 – 2014-02-08 (×11): 300 mg via ORAL
  Filled 2014-02-04 (×14): qty 1

## 2014-02-04 NOTE — Progress Notes (Addendum)
Pt PIV expire today.  Pt complaining that both existing PIV sites "hurt".  Pt complaining that left PIV site "hurts more than right".  Left site is where labetalol gtt currently running with maintenance fluid.  This RN attempted IV start with no success.  IV team consult placed.  Maintenance fluid and labetalol gtt stopped until new IV can be started.  Left wrist PIV d/c.  Will continue to monitor.

## 2014-02-04 NOTE — Progress Notes (Signed)
SUBJECTIVE: Lisa Leblanc is a 53 yo transferred from [facility] with CP and hypertensive emergency.  Was found to have type B aortic dissection. Hx of smoking  Complains of constant CP   BP 118/68 mmHg  Pulse 84  Temp(Src) 99.5 F (37.5 C) (Oral)  Resp 14  Ht 5\' 4"  (1.626 m)  Wt 149 lb 8 oz (67.813 kg)  BMI 25.65 kg/m2  SpO2 96%  Intake/Output Summary (Last 24 hours) at 02/04/14 1047 Last data filed at 02/04/14 1000  Gross per 24 hour  Intake    655 ml  Output    250 ml  Net    405 ml    PHYSICAL EXAM General: Well developed, well nourished, in no acute distress. Alert and oriented x 3.  Psych:  Good affect, responds appropriately Neck: No JVD. No masses noted.  Lungs: Clear bilaterally with no wheezes or rhonci noted.  Heart: RRR with no murmurs noted. Abdomen: Bowel sounds are present. Soft, non-tender.  Extremities: No lower extremity edema. No clubbing   LABS: Basic Metabolic Panel:  Recent Labs  00/45/99 0430 02/04/14 0247  NA 137 134*  K 3.7 3.9  CL 99 96  CO2 23 23  GLUCOSE 94 90  BUN 26* 27*  CREATININE 1.40* 1.30*  CALCIUM 8.4 8.6   CBC:  Recent Labs  02/03/14 0430 02/04/14 0247  WBC 10.8* 9.4  HGB 9.4* 9.5*  HCT 29.5* 29.5*  MCV 82.6 85.0  PLT 291 331   Cardiac Enzymes: No results for input(s): CKTOTAL, CKMB, CKMBINDEX, TROPONINI in the last 72 hours. Current Meds: . amLODipine  5 mg Oral Daily  . antiseptic oral rinse  7 mL Mouth Rinse BID  . carvedilol  25 mg Oral BID WC  . famotidine  20 mg Oral Daily  . furosemide  40 mg Oral Daily  . hydrALAZINE  100 mg Oral TID  . insulin aspart  0-15 Units Subcutaneous TID WC  . labetalol  200 mg Oral TID  . mometasone-formoterol  2 puff Inhalation BID  . montelukast  10 mg Oral QHS  . multivitamin with minerals  1 tablet Oral Daily  . OxyCODONE  20 mg Oral Q12H  . potassium chloride  40 mEq Oral BID  . senna-docusate  1 tablet Oral BID   Echo 02/02/14: Left ventricle: The cavity size  was normal. There was moderate concentric hypertrophy. Systolic function was mildly reduced. The estimated ejection fraction was in the range of 45% to 50%. Mild diffuse hypokinesis with no identifiable regional variations. Global longitudinal strain -15.5%. Calculated EF 48%. Features are consistent with a pseudonormal left ventricular filling pattern, with concomitant abnormal relaxation and increased filling pressure (grade 2 diastolic dysfunction). - Left atrium: The atrium was moderately dilated.  ASSESSMENT AND PLAN: 53 yo female with history of resistant HTN, presumed nonischemic cardiomyopathy and smoking presented to ER at [facility] with severe back pain and was found to have Type B aortic dissection, transferred to CT surgery service at [facility].   1. Type B aortic dissection: Medical management at this time. BP is well controlled on current regimen. She is on po amlodipine , Coreg, Hydralazine, and labetolol .    Will DC Coreg for now and continue with Labetolol  - will increase the dose slightly    2. HTN, resistant: Controlled. Likely cause of dissection. Eventually would benefit from additional imaging of abdominal aorta to assess for involvement of renal arteries. Continue current regimen.   3. Cardiomyopathy: Presumed  nonischemic cardiomyopathy. Echo 02/02/14 with LVEF=45-50%.  + diastolic dysfunction Continue Coreg, Hydralazine and Imdur.   4. Acute renal insufficiency:   Cr = 1.3 today . Stable. Continue Lasix to 40 mg once daily as she does not appear to be volume overloaded.     Vesta MixerPhilip J. Eboni Coval, [name], MD, [facility] AssociationFACC 02/04/2014, 10:53 AM [address],  [address] Office [phone] Pager [phone]- [phone]

## 2014-02-04 NOTE — Plan of Care (Signed)
Problem: Phase I Progression Outcomes Goal: Pain controlled with appropriate interventions Outcome: Completed/Met Date Met:  02/04/14 Goal: OOB as tolerated unless otherwise ordered Outcome: Completed/Met Date Met:  02/04/14 Goal: Voiding-avoid urinary catheter unless indicated Outcome: Completed/Met Date Met:  02/04/14 Goal: Hemodynamically stable Outcome: Completed/Met Date Met:  02/04/14

## 2014-02-04 NOTE — Progress Notes (Signed)
Pt sleeping.  BP consistently SBP >120.  Restarted labetalol gtt at 2mg /min.  Will continue to monitor.

## 2014-02-04 NOTE — Progress Notes (Signed)
      [address]       [place] [address]             [phone]        CARDIOTHORACIC SURGERY PROGRESS NOTE  Subjective: No new complaints  Objective: Vital signs: BP Readings from Last 1 Encounters:  02/04/14 129/73   Pulse Readings from Last 1 Encounters:  02/04/14 83   Resp Readings from Last 1 Encounters:  02/04/14 14   Temp Readings from Last 1 Encounters:  02/04/14 99.5 F (37.5 C) Oral    Hemodynamics:    Physical Exam:  Rhythm:   sinus  Breath sounds: clear  Heart sounds:  RRR  Incisions:  n/a  Abdomen:  soft  Extremities:  Warm, pulses intact   Intake/Output from previous day: 11/13 0701 - 11/14 0700 In: 705 [P.O.:350; I.V.:355] Out: 250 [Urine:250] Intake/Output this shift: Total I/O In: 60 [P.O.:60] Out: -   Lab Results:  CBC: Recent Labs  02/03/14 0430 02/04/14 0247  WBC 10.8* 9.4  HGB 9.4* 9.5*  HCT 29.5* 29.5*  PLT 291 331    BMET:  Recent Labs  02/03/14 0430 02/04/14 0247  NA 137 134*  K 3.7 3.9  CL 99 96  CO2 23 23  GLUCOSE 94 90  BUN 26* 27*  CREATININE 1.40* 1.30*  CALCIUM 8.4 8.6     CBG (last 3)   Recent Labs  02/03/14 1650 02/03/14 2346 02/04/14 0734  GLUCAP 91 91 98    ABG    Component Value Date/Time   TCO2 35 11/20/2012 1908    CXR: n/a  Assessment/Plan:  Overall stable BP under better control Chronic pain remains a challenging problem   Will add prn labetalol order to manage occasional spikes  Otherwise continue current plan  Jeryl Umholtz H 02/04/2014 10:16 AM

## 2014-02-04 NOTE — Progress Notes (Signed)
TCTS BRIEF SICU PROGRESS NOTE  Stable day  Plan: Continue current plan  OWEN,CLARENCE H 02/04/2014 7:56 PM

## 2014-02-05 LAB — CULTURE, BLOOD (SINGLE)

## 2014-02-05 LAB — GLUCOSE, CAPILLARY
GLUCOSE-CAPILLARY: 111 mg/dL — AB (ref 70–99)
Glucose-Capillary: 104 mg/dL — ABNORMAL HIGH (ref 70–99)
Glucose-Capillary: 101 mg/dL — ABNORMAL HIGH (ref 70–99)
Glucose-Capillary: 123 mg/dL — ABNORMAL HIGH (ref 70–99)
Glucose-Capillary: 123 mg/dL — ABNORMAL HIGH (ref 70–99)

## 2014-02-05 NOTE — Progress Notes (Signed)
Patient transferred with belongings, medications, and chart to room 2 [address] 8 with nurse. Attached to monitor and new nurse at bedside. No new problems at this time. Jacqulynn Cadet

## 2014-02-05 NOTE — Progress Notes (Signed)
SUBJECTIVE: Lisa Leblanc is a 53 yo transferred from [facility] with CP and hypertensive emergency.  Was found to have type B aortic dissection. Hx of smoking  Complains of constant CP  BP is much better today   BP 95/68 mmHg  Pulse 80  Temp(Src) 98.7 F (37.1 C) (Oral)  Resp 13  Ht 5\' 4"  (1.626 m)  Wt 149 lb 8 oz (67.813 kg)  BMI 25.65 kg/m2  SpO2 96%  Intake/Output Summary (Last 24 hours) at 02/05/14 0953 Last data filed at 02/05/14 0700  Gross per 24 hour  Intake   1080 ml  Output    300 ml  Net    780 ml    PHYSICAL EXAM General: Well developed, well nourished, in no acute distress. Alert and oriented x 3.  Psych:  Good affect, responds appropriately Neck: No JVD. No masses noted.  Lungs: Clear bilaterally with no wheezes or rhonci noted.  Heart: RRR with no murmurs noted. Abdomen: Bowel sounds are present. Soft, non-tender.  Extremities: No lower extremity edema. No clubbing  Neuro:  nonfocal Psyche:  Asks many questions, sometimes the same question twice  LABS: Basic Metabolic Panel:  Recent Labs  89/38/10 0430 02/04/14 0247  NA 137 134*  K 3.7 3.9  CL 99 96  CO2 23 23  GLUCOSE 94 90  BUN 26* 27*  CREATININE 1.40* 1.30*  CALCIUM 8.4 8.6   CBC:  Recent Labs  02/03/14 0430 02/04/14 0247  WBC 10.8* 9.4  HGB 9.4* 9.5*  HCT 29.5* 29.5*  MCV 82.6 85.0  PLT 291 331   Cardiac Enzymes: No results for input(s): CKTOTAL, CKMB, CKMBINDEX, TROPONINI in the last 72 hours. Current Meds: . famotidine  20 mg Oral Daily  . furosemide  40 mg Oral Daily  . hydrALAZINE  100 mg Oral TID  . insulin aspart  0-15 Units Subcutaneous TID WC  . labetalol  300 mg Oral TID  . mometasone-formoterol  2 puff Inhalation BID  . montelukast  10 mg Oral QHS  . multivitamin with minerals  1 tablet Oral Daily  . OxyCODONE  20 mg Oral Q12H  . potassium chloride  40 mEq Oral BID  . senna-docusate  1 tablet Oral BID   Echo 02/02/14: Left ventricle: The cavity size was  normal. There was moderate concentric hypertrophy. Systolic function was mildly reduced. The estimated ejection fraction was in the range of 45% to 50%. Mild diffuse hypokinesis with no identifiable regional variations. Global longitudinal strain -15.5%. Calculated EF 48%. Features are consistent with a pseudonormal left ventricular filling pattern, with concomitant abnormal relaxation and increased filling pressure (grade 2 diastolic dysfunction). - Left atrium: The atrium was moderately dilated.  ASSESSMENT AND PLAN: 53 yo female with history of resistant HTN, presumed nonischemic cardiomyopathy and smoking presented to ER at [facility] with severe back pain and was found to have Type B aortic dissection, transferred to CT surgery service at [facility].   1. Type B aortic dissection: Medical management at this time. BP is well controlled on current regimen. She is on po amlodipine , Coreg, Hydralazine, and labetolol .    Will DC amlodipine. She may not need the hydralazine this am if her BP is still < 100.   2. HTN, resistant: Controlled. Likely cause of dissection. Eventually would benefit from additional imaging of abdominal aorta to assess for involvement of renal arteries. Continue current regimen.   3. Cardiomyopathy: Presumed nonischemic cardiomyopathy. Echo 02/02/14 with LVEF=45-50%.  + diastolic  dysfunction Continue Coreg, Hydralazine and Imdur.   4. Acute renal insufficiency:    Lab Results  Component Value Date   CREATININE 1.30* 02/04/2014   Stable. Continue Lasix to 40 mg once daily as she does not appear to be volume overloaded.     [name], [name], MD, [facility] LLCFACC 02/05/2014, 9:53 AM [address],  [address] Office [phone] Pager [phone]

## 2014-02-05 NOTE — Progress Notes (Signed)
      [address]       [place] [address]             [phone]        CARDIOTHORACIC SURGERY PROGRESS NOTE  Subjective: Doing better today.  Ambulated some.  Reporting less pain.  Objective: Vital signs: BP Readings from Last 1 Encounters:  02/05/14 113/63   Pulse Readings from Last 1 Encounters:  02/05/14 79   Resp Readings from Last 1 Encounters:  02/05/14 16   Temp Readings from Last 1 Encounters:  02/05/14 98.7 F (37.1 C) Oral    Hemodynamics:    Physical Exam:  Rhythm:   sinus  Breath sounds: clear  Heart sounds:  RRR  Incisions:  n/a  Abdomen:  soft  Extremities:  Warm, pulses intact   Intake/Output from previous day: 11/14 0701 - 11/15 0700 In: 1140 [P.O.:1140] Out: 300 [Urine:300] Intake/Output this shift: Total I/O In: 360 [P.O.:360] Out: -   Lab Results:  CBC: Recent Labs  02/03/14 0430 02/04/14 0247  WBC 10.8* 9.4  HGB 9.4* 9.5*  HCT 29.5* 29.5*  PLT 291 331    BMET:  Recent Labs  02/03/14 0430 02/04/14 0247  NA 137 134*  K 3.7 3.9  CL 99 96  CO2 23 23  GLUCOSE 94 90  BUN 26* 27*  CREATININE 1.40* 1.30*  CALCIUM 8.4 8.6     CBG (last 3)   Recent Labs  02/04/14 0734 02/04/14 1141 02/05/14 0740  GLUCAP 98 111* 104*    ABG    Component Value Date/Time   TCO2 35 11/20/2012 1908    CXR: n/a  Assessment/Plan:  BP under fairly good control Chronic pain remains a challenging problem, but doing better today   Continue current plan  Mobilize  Transfer floor  OWEN,CLARENCE H 02/05/2014 12:06 PM

## 2014-02-06 ENCOUNTER — Other Ambulatory Visit: Payer: BC Managed Care – PPO

## 2014-02-06 DIAGNOSIS — M549 Dorsalgia, unspecified: Secondary | ICD-10-CM

## 2014-02-06 DIAGNOSIS — I5042 Chronic combined systolic (congestive) and diastolic (congestive) heart failure: Secondary | ICD-10-CM | POA: Diagnosis present

## 2014-02-06 LAB — GLUCOSE, CAPILLARY
GLUCOSE-CAPILLARY: 122 mg/dL — AB (ref 70–99)
GLUCOSE-CAPILLARY: 101 mg/dL — AB (ref 70–99)
GLUCOSE-CAPILLARY: 95 mg/dL (ref 70–99)
Glucose-Capillary: 112 mg/dL — ABNORMAL HIGH (ref 70–99)

## 2014-02-06 MED ORDER — OXYCODONE HCL ER 20 MG PO T12A
40.0000 mg | EXTENDED_RELEASE_TABLET | Freq: Two times a day (BID) | ORAL | Status: DC
  Administered 2014-02-06 – 2014-02-08 (×4): 40 mg via ORAL
  Filled 2014-02-06 (×5): qty 4

## 2014-02-06 NOTE — Progress Notes (Signed)
SUBJECTIVE:  Patient complaining of back pain, reports she would like to go home.  No chest pain, no SOB, no abdominal pain.  OBJECTIVE:   Vitals:   Filed Vitals:   02/05/14 1500 02/05/14 1518 02/05/14 1933 02/06/14 0428  BP: 107/68 119/70 104/59 108/65  Pulse: 88 85 81 92  Temp:  99.1 F (37.3 C) 98.9 F (37.2 C) 99.3 F (37.4 C)  TempSrc:  Oral Oral Oral  Resp: 15 16 17 17   Height:      Weight:    142 lb 3.2 oz (64.5 kg)  SpO2: 96% 96% 96% 96%   I&O's:   Intake/Output Summary (Last 24 hours) at 02/06/14 [id number] Last data filed at 02/05/14 1500  Gross per 24 hour  Intake    480 ml  Output      0 ml  Net    480 ml   TELEMETRY: Reviewed telemetry pt in NSR:     PHYSICAL EXAM General: Well developed, well nourished, in no acute distress Head:   Normal cephalic and atramatic  Lungs:  Clear bilaterally to auscultation. Heart:  HRRR S1, S2  No JVD.   Abdomen: abdomen soft and non-tender Extremities:  No edema.   Neuro: Alert and oriented. Psych:  Normal affect, responds appropriately Skin: No rash   LABS: Basic Metabolic Panel:  Recent Labs  65/78/4611/14/15 0247  NA 134*  K 3.9  CL 96  CO2 23  GLUCOSE 90  BUN 27*  CREATININE 1.30*  CALCIUM 8.6   Liver Function Tests: No results for input(s): AST, ALT, ALKPHOS, BILITOT, PROT, ALBUMIN in the last 72 hours. No results for input(s): LIPASE, AMYLASE in the last 72 hours. CBC:  Recent Labs  02/04/14 0247  WBC 9.4  HGB 9.5*  HCT 29.5*  MCV 85.0  PLT 331   Cardiac Enzymes: No results for input(s): CKTOTAL, CKMB, CKMBINDEX, TROPONINI in the last 72 hours. BNP: Invalid input(s): POCBNP D-Dimer: No results for input(s): DDIMER in the last 72 hours. Hemoglobin A1C: No results for input(s): HGBA1C in the last 72 hours. Fasting Lipid Panel: No results for input(s): CHOL, HDL, LDLCALC, TRIG, CHOLHDL, LDLDIRECT in the last 72 hours. Thyroid Function Tests: No results for input(s): TSH, T4TOTAL, T3FREE,  THYROIDAB in the last 72 hours.  Invalid input(s): FREET3 Anemia Panel: No results for input(s): VITAMINB12, FOLATE, FERRITIN, TIBC, IRON, RETICCTPCT in the last 72 hours. Coag Panel:   No results found for: INR, PROTIME  RADIOLOGY: Ct Head Wo Contrast  02/01/2014   CLINICAL DATA:  Altered mental status, history of type B aortic dissection  EXAM: CT HEAD WITHOUT CONTRAST  TECHNIQUE: Contiguous axial images were obtained from the base of the skull through the vertex without intravenous contrast.  COMPARISON:  None.  FINDINGS: No skull fracture is noted. Paranasal sinuses and mastoid air cells are unremarkable.  No intracranial hemorrhage, mass effect or midline shift.  No acute cortical infarction. No hydrocephalus. No mass lesion is noted on this unenhanced scan. The gray and white-matter differentiation is preserved.  IMPRESSION: No acute intracranial abnormality.   Electronically Signed   By: Natasha MeadLiviu  Pop M.D.   On: 02/01/2014 16:11   Mr Maxine GlennMra Chest Wo Contrast  01/31/2014   CLINICAL DATA:  Severe chest pain and hypertension.  EXAM: MRA CHEST WITH OR WITHOUT CONTRAST  TECHNIQUE: Angiographic images of the chest were obtained using MRA technique without and with intravenous contrast.  CONTRAST:  None  COMPARISON:  None.  FINDINGS: The study was impaired  by patient motion but is diagnostic for a Type B acute dissection of the thoracic aorta which begins just beyond the origin of the left subclavian artery and continues throughout the distal arch, descending thoracic aorta and into the abdominal aorta. There is no associated evidence of aortic rupture. The ascending thoracic aorta and great vessels show normal patency without evidence of significant aneurysmal disease. Maximal caliber of the thoracic aorta is approximately 3 cm.  There are likely trace bilateral pleural effusions. The heart is moderately enlarged. There is a small pericardial effusion. The thoracic spine shows multiple compression  deformities which are age indeterminate.  IMPRESSION: Acute Type B aortic dissection beginning just beyond the origin of the left subclavian artery and continuing into the abdominal aorta. No evidence of ascending thoracic aorta or great vessel involvement. No associated significant hemorrhage is seen. Cardiomegaly with small pericardial effusion.  A preliminary report was rendered by Dr. Ulyses Southward. Critical Value/emergent results were called by telephone at the time of interpretation on 01/31/2014 at 8:41 pm to Dr. Valeria Batman, who verbally acknowledged these results.   Electronically Signed   By: Irish Lack M.D.   On: 01/31/2014 20:43   Dg Chest Port 1 View  02/02/2014   CLINICAL DATA:  Aortic dissection, CHF, hypertension, diabetes  EXAM: PORTABLE CHEST - 1 VIEW  COMPARISON:  Portable chest x-ray of February 01, 2014  FINDINGS: The lungs are adequately inflated. The interstitial markings are more prominent bilaterally. The cardiopericardial silhouette remains enlarged. The pulmonary vascularity is less distinct. There is no pleural effusion or pneumothorax. The mediastinum remains mildly widened. The bony thorax is unremarkable.  IMPRESSION: Pulmonary interstitial edema more conspicuous today likely secondary to CHF. There is no pneumonia nor significant pleural effusion.   Electronically Signed   By: David  [place]   On: 02/02/2014 07:44   Dg Chest Port 1 View  02/01/2014   CLINICAL DATA:  Aortic dissection. ; history of malignant hypertension, CHF, diabetes  EXAM: PORTABLE CHEST - 1 VIEW  COMPARISON:  MR angiogram of the chest of January 31, 2014  FINDINGS: The lungs are adequately inflated. There is no focal infiltrate. The perihilar lung markings are coarse bilaterally. The central pulmonary vascularity is prominent. The cardiac silhouette is enlarged. There is no pleural effusion or pneumothorax. The mediastinum is not abnormally widened. The bony thorax is unremarkable.  IMPRESSION: Low-grade  compensated CHF without significant interstitial or alveolar edema. There is no pleural effusion or pneumonia.   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  Patient on Labetalol 300mg  TID, Hydralazine 100mg  TID with PRN labetalol  Chronic Back pain:  Narcotic dependence at home.  Chronic Combined CHF: LVEF improved to 40-45% on echo this admission.  Continue BP control as above.  Acute renal insufficiency: when SCr stabilized patient may benefit from addition/ changing hydralazine to ACEi.  Gust Rung, DO  02/06/2014  9:26 AM   I have examined the patient and reviewed assessment and plan and discussed with patient.  Agree with above as stated.  Complains of back pain- chronic.  Takes narcotics at home.  Need to find BP regimen that she will take regularly.  TID drugs will be difficult to take.  Switch to ACE-I when possible to help with compliance.  Aneita Kiger S.

## 2014-02-06 NOTE — Progress Notes (Addendum)
      [address]       [place] [address]             [phone]             Subjective: Patient with complaints of back pain this am. She denies chest or abdominal pain.  Objective: Vital signs in last 24 hours: Temp:  [98.7 F (37.1 C)-99.3 F (37.4 C)] 99.3 F (37.4 C) (11/16 0428) Pulse Rate:  [77-92] 92 (11/16 0428) Cardiac Rhythm:  [-] Normal sinus rhythm (11/15 2300) Resp:  [12-18] 17 (11/16 0428) BP: (95-123)/(58-76) 108/65 mmHg (11/16 0428) SpO2:  [96 %-99 %] 96 % (11/16 0428) Weight:  [142 lb 3.2 oz (64.5 kg)-144 lb (65.318 kg)] 142 lb 3.2 oz (64.5 kg) (11/16 0428)   Current Weight  02/06/14 142 lb 3.2 oz (64.5 kg)      Intake/Output from previous day: 11/15 0701 - 11/16 0700 In: 600 [P.O.:600] Out: -    Physical Exam:  Cardiovascular: RRR Pulmonary: Clear to auscultation bilaterally; no rales, wheezes, or rhonchi. Abdomen: Soft, non tender, bowel sounds present. Extremities: No lower extremity edema. .  Lab Results: CBC: Recent Labs  02/04/14 0247  WBC 9.4  HGB 9.5*  HCT 29.5*  PLT 331   BMET:  Recent Labs  02/04/14 0247  NA 134*  K 3.9  CL 96  CO2 23  GLUCOSE 90  BUN 27*  CREATININE 1.30*  CALCIUM 8.6    PT/INR: No results found for: INR, PROTIME ABG:  INR: Will add last result for INR, ABG once components are confirmed Will add last 4 CBG results once components are confirmed  Assessment/Plan:  1. CV - SR in the 90's. Type III dissection. BP remains well controlled. On Labetalol 300 tid and Hydralazine 100 tid. 2. Chronic pain-on Oxycontin 20 bid, Vicodin PRN, Dilaudid IV PRN, and Oxy IR PRN.   ZIMMERMAN,DONIELLE MPA-C 02/06/2014,8:27 AM  Still having a lot of her chronic back pain. Was on 40 mg oxycontin BID + prn oxycodone at home Will increase oxycontin to 40 mg BID Possible home in 24- 8 hours if she is able to get up and around and BP remains controlled

## 2014-02-06 NOTE — Plan of Care (Signed)
Problem: Phase II Progression Outcomes Goal: IV changed to normal saline lock Outcome: Completed/Met Date Met:  02/06/14     

## 2014-02-07 LAB — COMPREHENSIVE METABOLIC PANEL
ALT: 57 U/L — ABNORMAL HIGH (ref 0–35)
AST: 94 U/L — ABNORMAL HIGH (ref 0–37)
Albumin: 2.9 g/dL — ABNORMAL LOW (ref 3.5–5.2)
Alkaline Phosphatase: 78 U/L (ref 39–117)
Anion gap: 17 — ABNORMAL HIGH (ref 5–15)
BUN: 27 mg/dL — ABNORMAL HIGH (ref 6–23)
CO2: 21 mEq/L (ref 19–32)
Calcium: 9.5 mg/dL (ref 8.4–10.5)
Chloride: 98 mEq/L (ref 96–112)
Creatinine, Ser: 1.42 mg/dL — ABNORMAL HIGH (ref 0.50–1.10)
GFR calc Af Amer: 47 mL/min — ABNORMAL LOW (ref >90)
GFR calc non Af Amer: 41 mL/min — ABNORMAL LOW (ref >90)
Glucose, Bld: 100 mg/dL — ABNORMAL HIGH (ref 70–99)
Potassium: 5.6 mEq/L — ABNORMAL HIGH (ref 3.7–5.3)
Sodium: 136 mEq/L — ABNORMAL LOW (ref 137–147)
Total Bilirubin: 0.5 mg/dL (ref 0.3–1.2)
Total Protein: 7.6 g/dL (ref 6.0–8.3)

## 2014-02-07 LAB — CBC
HCT: 29.2 % — ABNORMAL LOW (ref 36.0–46.0)
Hemoglobin: 9.3 g/dL — ABNORMAL LOW (ref 12.0–15.0)
MCH: 26.5 pg (ref 26.0–34.0)
MCHC: 31.8 g/dL (ref 30.0–36.0)
MCV: 83.2 fL (ref 78.0–100.0)
Platelets: 484 10*3/uL — ABNORMAL HIGH (ref 150–400)
RBC: 3.51 MIL/uL — ABNORMAL LOW (ref 3.87–5.11)
RDW: 15.4 % (ref 11.5–15.5)
WBC: 9.9 10*3/uL (ref 4.0–10.5)

## 2014-02-07 LAB — GLUCOSE, CAPILLARY
GLUCOSE-CAPILLARY: 98 mg/dL (ref 70–99)
GLUCOSE-CAPILLARY: 102 mg/dL — AB (ref 70–99)
Glucose-Capillary: 106 mg/dL — ABNORMAL HIGH (ref 70–99)
Glucose-Capillary: 106 mg/dL — ABNORMAL HIGH (ref 70–99)

## 2014-02-07 LAB — MAGNESIUM: Magnesium: 2.3 mg/dL (ref 1.5–2.5)

## 2014-02-07 MED ORDER — HALOPERIDOL LACTATE 5 MG/ML IJ SOLN
5.0000 mg | Freq: Once | INTRAMUSCULAR | Status: DC
  Filled 2014-02-07: qty 1

## 2014-02-07 MED ORDER — NICOTINE 14 MG/24HR TD PT24
14.0000 mg | MEDICATED_PATCH | Freq: Every day | TRANSDERMAL | Status: DC
  Administered 2014-02-08: 14 mg via TRANSDERMAL
  Filled 2014-02-07 (×2): qty 1

## 2014-02-07 MED ORDER — HALOPERIDOL LACTATE 5 MG/ML IJ SOLN
5.0000 mg | Freq: Once | INTRAMUSCULAR | Status: DC
  Filled 2014-02-07 (×2): qty 1
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
## 2014-02-07 NOTE — Progress Notes (Signed)
At 1245 the patient became confused, agitated and paranoid. She demanded that the police be called and that she be allowed to leave. Security was called and arrived to the floor with two police officers from [place] [employer].  Dr.  Zachery Conch was notified and ordered Haldol 5 mg IV once, however the patient calmed down and went back to bed after the police and her husband spoke to her. The Haldol was not given. This RN requested that a family member come to sit with the patient.  The patient's husband recommended the patient's sister and agreed to have her call to speak with this RN which she did. She agreed to come and sit with the patient if she became agitated again. The patient remained in bed for about an hour but is currently demonstrating the behavior described above.  She is standing in the hallway and looking suspiciously at everyone.  States: "Something is going on here and I don't know what." She is refusing  to allow anyone to take her vital signs and refusing medications.  The patient's sister was called and she spoke with the patient to try to encourage her to rest in her room and accept medications.  The patient continues to refuse any care at this time.  Will continue to monitor.

## 2014-02-07 NOTE — Progress Notes (Signed)
       [address]       [place] [address]             [phone]               Subjective: Confused and agitated overnight requiring security to be called.  She was given IV Haldol and is somehwat calmer this am, but remains agitated and paranoid.  Wants family to take her home, bring her pills from home and get her cigarettes.  Family worried she is in "withdrawal".  She continues to complain of chest and back discomfort.   Objective: Vital signs in last 24 hours: Patient Vitals for the past 24 hrs:  BP Temp Temp src Pulse Resp SpO2  02/07/14 0500 (!) 151/89 mmHg - - 98 - -  02/07/14 0202 (!) 155/91 mmHg - - (!) 104 - -  02/06/14 2041 - - - - - 95 %  02/06/14 1957 (!) 103/55 mmHg 98.7 F (37.1 C) Oral 86 18 94 %  02/06/14 1345 113/69 mmHg 98.8 F (37.1 C) Oral 86 18 95 %  02/06/14 1000 121/69 mmHg 98.8 F (37.1 C) Oral 85 18 95 %   Current Weight  02/06/14 142 lb 3.2 oz (64.5 kg)     Intake/Output from previous day: 11/16 0701 - 11/17 0700 In: 240 [P.O.:240] Out: -     PHYSICAL EXAM: General: Patient is drowsy but arousable on exam.   Heart: RRR Lungs: Clear Extremities: No edema    Lab Results: CBC: Recent Labs  02/07/14 0606  WBC 9.9  HGB 9.3*  HCT 29.2*  PLT 484*   BMET:  Recent Labs  02/07/14 0606  NA 136*  K 5.6*  CL 98  CO2 21  GLUCOSE 100*  BUN 27*  CREATININE 1.42*  CALCIUM 9.5    PT/INR: No results for input(s): LABPROT, INR in the last 72 hours.    Assessment/Plan: Type III aortic dissection-  HTN- BPs better controlled. Continue current regimen,cardiology following.  Acute renal insufficiency- Cr up today from 1.3.  Will watch.  Hyperkalemia- will d/c supplemental K in light of renal function.  Chronic narcotic dependence- Pt is back on home meds, but family reports that she actually takes Oxy more often than prescribed.  She appears oriented this am, but is definitely agitated and mildly sedated  (although family report that at home she "takes pain meds, then sleeps, then takes more Oxy when she wakes up").  Will watch closely.  Will prescribe nicotine patch as she wants to leave to smoke.    LOS: 7 days    Arthella Headings H 02/07/2014

## 2014-02-07 NOTE — Discharge Instructions (Signed)
Opioid Use Disorder °Opioid use disorder is a mental disorder. It is the continued nonmedical use of opioids in spite of risks to health and well-being. Misused opioids include the street drug heroin. They also include pain medicines such as morphine, hydrocodone, oxycodone, and fentanyl. Opioids are very addictive. People who misuse opioids get an exaggerated feeling of well-being. Opioid use disorder often disrupts activities at home, work, or school. It may cause mental or physical problems.  °A family history of opioid use disorder puts you at higher risk of it. People with opioid use disorder often misuse other drugs or have mental illness such as depression, posttraumatic stress disorder, or antisocial personality disorder. They also are at risk of suicide and death from overdose. °SIGNS AND SYMPTOMS  °Signs and symptoms of opioid use disorder include: °· Use of opioids in larger amounts or over a longer period than intended. °· Unsuccessful attempts to cut down or control opioid use. °· A lot of time spent obtaining, using, or recovering from the effects of opioids. °· A strong desire or urge to use opioids (craving). °· Continued use of opioids in spite of major problems at work, school, or home because of use. °· Continued use of opioids in spite of relationship problems because of use. °· Giving up or cutting down on important life activities because of opioid use. °· Use of opioids over and over in situations when it is physically hazardous, such as driving a car. °· Continued use of opioids in spite of a physical problem that is likely related to use. Physical problems can include: °¨ Severe constipation. °¨ Poor nutrition. °¨ Infertility. °¨ Tuberculosis. °¨ Aspiration pneumonia. °¨ Infections such as human immunodeficiency virus (HIV) and hepatitis (from injecting opioids). °· Continued use of opioids in spite of a mental problem that is likely related to use. Mental problems can  include: °¨ Depression. °¨ Anxiety. °¨ Hallucinations. °¨ Sleep problems. °¨ Loss of sexual function. °· Need to use more and more opioids to get the same effect, or lessened effect over time with use of the same amount (tolerance). °· Having withdrawal symptoms when opioid use is stopped, or using opioids to reduce or avoid withdrawal symptoms. Withdrawal symptoms include: °¨ Depressed, anxious, or irritable mood. °¨ Nausea, vomiting, diarrhea, or intestinal cramping. °¨ Muscle aches or spasms. °¨ Excessive tearing or runny nose. °¨ Dilated pupils, sweating, or hairs standing on end. °¨ Yawning. °¨ Fever, raised blood pressure, or fast pulse. °¨ Restlessness or trouble sleeping. This does not apply to people taking opioids for medical reasons only. °DIAGNOSIS °Opioid use disorder is diagnosed by your health care provider. You may be asked questions about your opioid use and and how it affects your life. A physical exam may be done. A drug screen may be ordered. You may be referred to a mental health professional. The diagnosis of opioid use disorder requires at least two symptoms within 12 months. The type of opioid use disorder you have depends on the number of signs and symptoms you have. The type may be: °· Mild. Two or three signs and symptoms.    °· Moderate. Four or five signs and symptoms.   °· Severe. Six or more signs and symptoms. °TREATMENT  °Treatment is usually provided by mental health professionals with training in substance use disorders. The following options are available: °· Detoxification. This is the first step in treatment for withdrawal. It is medically supervised withdrawal with the use of medicines. These medicines lessen withdrawal symptoms. They also raise the chance   of becoming opioid free.  Counseling, also known as talk therapy. Talk therapy addresses the reasons you use opioids. It also addresses ways to keep you from using again (relapse). The goals of talk therapy are to avoid  relapse by:  Identifying and avoiding triggers for use.  Finding healthy ways to cope with stress.  Learning how to handle cravings.  Support groups. Support groups provide emotional support, advice, and guidance.  A medicine that blocks opioid receptors in your brain. This medicine can reduce opioid cravings that lead to relapse. This medicine also blocks the desired opioid effect when relapse occurs.  Opioids that are taken by mouth in place of the misused opioid (opioid maintenance treatment). These medicines satisfy cravings but are safer than commonly misused opioids. This often is the best option for people who continue to relapse with other treatments. HOME CARE INSTRUCTIONS   Take medicines only as directed by your health care provider.  Check with your health care provider before starting new medicines.  Keep all follow-up visits as directed by your health care provider. SEEK MEDICAL CARE IF:  You are not able to take your medicines as directed.  Your symptoms get worse. SEEK IMMEDIATE MEDICAL CARE IF:  You have serious thoughts about hurting yourself or others.  You may have taken an overdose of opioids. FOR MORE INFORMATION  National Institute on Drug Abuse: http://www.price-smith.com/www.drugabuse.gov  Substance Abuse and Mental Health Services Administration: SkateOasis.com.ptwww.samhsa.gov Document Released: 01/05/2007 Document Revised: 07/25/2013 Document Reviewed: 03/23/2013 [facility] Patient Information 2015 [place], [place]. This information is not intended to replace advice given to you by your health care provider. Make sure you discuss any questions you have with your health care provider. Hypertension Hypertension is another name for high blood pressure. High blood pressure forces your heart to work harder to pump blood. A blood pressure reading has two numbers, which includes a higher number over a lower number (example: 110/72). HOME CARE   Have your blood pressure rechecked by your  doctor.  Only take medicine as told by your doctor. Follow the directions carefully. The medicine does not work as well if you skip doses. Skipping doses also puts you at risk for problems.  Do not smoke.  Monitor your blood pressure at home as told by your doctor. GET HELP IF:  You think you are having a reaction to the medicine you are taking.  You have repeat headaches or feel dizzy.  You have puffiness (swelling) in your ankles.  You have trouble with your vision. GET HELP RIGHT AWAY IF:   You get a very bad headache and are confused.  You feel weak, numb, or faint.  You get chest or belly (abdominal) pain.  You throw up (vomit).  You cannot breathe very well. MAKE SURE YOU:   Understand these instructions.  Will watch your condition.  Will get help right away if you are not doing well or get worse. Document Released: 08/27/2007 Document Revised: 03/15/2013 Document Reviewed: 12/31/2012 [facility] Patient Information 2015 [place], [place]. This information is not intended to replace advice given to you by your health care provider. Make sure you discuss any questions you have with your health care provider. Aortic Dissection Aortic dissection happens when there is a tear in the inner wall of the aorta. The aorta is the largest blood vessel in the body. It carries blood from the heart to the arteries, and those arteries send blood to all parts of the body. When there is a tear, the blood  enters inside the aortic wall and creates a new space for the blood. This causes the aorta to split even more. The collection of blood into this new space may lower the amount of blood that reaches the rest of the body. A dissection causes the aortic wall to be weakened and can cause rupture. Death can occur. It is critical to have medical care right away. CAUSES  Aortic dissection is caused by two things:  A small tear that develops in a weak or injured part of the aortic wall.  The tear  spreads inside the aortic wall and the aorta becomes "double-barreled." The following increase the risk of aortic dissection:  High blood pressure.  Hardening of the walls of arteries.  Use of cocaine.  Blunt injury to the chest.  Genetic disorders that affect the connective tissue (one example is Marfan syndrome).  Problems (such as infection) that affect either the aorta or the heart valve.  Problems that affect the tissue that holds together different parts of your body (connective tissue). For example, a few uncommon arthritis problems can increase the chance of an aortic dissection. SYMPTOMS   Sudden onset of severe chest pain. The pain may be sharp, stabbing, or ripping in nature.  The pain may then shift to the shoulder, arm, neck, jaw, abdomen, or hips. Other symptoms may include:  Breathing trouble.  Weakness of any part of the body.  Decreased feeling of any part of the body.  Fainting.  Dizziness.  Feeling sick to your stomach (nausea) and vomiting.  Sweating a lot.  Confusion/dazed. DIAGNOSIS  Testing may include the following:  Electrocardiogram.  Chest x-ray.  Imaging study done after injecting a dye to clearly view the blood vessel (aortic angiography).  Specialized x-ray of the chest is done after injecting a dye (CT scan) or an MRI.  A test where the image of the heart is studied using sound waves (Echocardiogram). TREATMENT  Treatment will vary depending on what part of the aorta has the problem. Your caregiver may admit you into an intensive care unit. Medicines that reduce the blood pressure and strong painkillers may be given right away. In many cases, heart medicines may be given to relieve the symptoms and help blood pressure.  If medicines are used first and they do not help, then surgery may be done to repair or replace the damaged portion of the aorta. In some cases, surgery is needed right away. You may have to stay in the hospital for  7-10 days. If the aortic valve is damaged due to aortic dissection, repair or replacement of the valve may be done. If the arteries of the heart are affected, bypass surgery of the heart may be done. THIS IS AN EMERGENCY. Call your local emergency services (911 in [place] right away or the closest medical facility.  Document Released: 06/17/2007 Document Revised: 12/29/2012 Document Reviewed: 06/17/2007 [facility] Patient Information 2015 [place], [place]. This information is not intended to replace advice given to you by your health care provider. Make sure you discuss any questions you have with your health care provider.

## 2014-02-08 LAB — BASIC METABOLIC PANEL
Anion gap: 15 (ref 5–15)
BUN: 28 mg/dL — ABNORMAL HIGH (ref 6–23)
CO2: 22 mEq/L (ref 19–32)
Calcium: 8.9 mg/dL (ref 8.4–10.5)
Chloride: 97 mEq/L (ref 96–112)
Creatinine, Ser: 1.5 mg/dL — ABNORMAL HIGH (ref 0.50–1.10)
GFR calc Af Amer: 44 mL/min — ABNORMAL LOW (ref >90)
GFR, EST NON AFRICAN AMERICAN: 38 mL/min — AB (ref >90)
GLUCOSE: 84 mg/dL (ref 70–99)
Potassium: 4.7 mEq/L (ref 3.7–5.3)
SODIUM: 134 meq/L — AB (ref 137–147)

## 2014-02-08 LAB — CULTURE, BLOOD (ROUTINE X 2)
CULTURE: NO GROWTH
Culture: NO GROWTH

## 2014-02-08 LAB — GLUCOSE, CAPILLARY: Glucose-Capillary: 96 mg/dL (ref 70–99)

## 2014-02-08 MED ORDER — FUROSEMIDE 40 MG PO TABS: 40.0000 mg | ORAL_TABLET | Freq: Every day | ORAL | Status: DC

## 2014-02-08 MED ORDER — NICOTINE 14 MG/24HR TD PT24: 14.0000 mg | MEDICATED_PATCH | Freq: Every day | TRANSDERMAL | Status: DC

## 2014-02-08 NOTE — Progress Notes (Signed)
Pt is being discharged home. Pt has been provided with discharge instructions. RN went over instructions with the patient and answered all questions the patient had. She is being transported home by her husband

## 2014-02-08 NOTE — Plan of Care (Signed)
Problem: Phase III Progression Outcomes Goal: Foley discontinued Outcome: Not Applicable Date Met:  09/73/53

## 2014-02-08 NOTE — Progress Notes (Signed)
       [address]       [place] [address]             [phone]               Subjective: Alert and conversant this am. Still sore, but not having pain like she was before.  "I'm supposed to go home today."   Objective: Vital signs in last 24 hours: Patient Vitals for the past 24 hrs:  BP Temp Temp src Pulse Resp SpO2 Weight  02/08/14 0847 - - - - - 98 % -  02/08/14 0337 124/72 mmHg 99.3 F (37.4 C) Oral 88 18 99 % 135 lb 9.3 oz (61.5 kg)  02/07/14 2248 119/69 mmHg - - 84 - - -  02/07/14 2108 (!) 105/54 mmHg 99.4 F (37.4 C) Oral 86 18 100 % -  02/07/14 1349 121/71 mmHg (!) 100.5 F (38.1 C) Oral (!) 102 18 94 % -   Current Weight  02/08/14 135 lb 9.3 oz (61.5 kg)     Intake/Output from previous day: 11/17 0701 - 11/18 0700 In: 240 [P.O.:240] Out: 0     PHYSICAL EXAM:  Heart: RRR Lungs: Clear Extremities: Warm, well perfused, no edema    Lab Results: CBC: Recent Labs  02/07/14 0606  WBC 9.9  HGB 9.3*  HCT 29.2*  PLT 484*   BMET:  Recent Labs  02/07/14 0606 02/08/14 0357  NA 136* 134*  K 5.6* 4.7  CL 98 97  CO2 21 22  GLUCOSE 100* 84  BUN 27* 28*  CREATININE 1.42* 1.50*  CALCIUM 9.5 8.9    PT/INR: No results for input(s): LABPROT, INR in the last 72 hours.    Assessment/Plan: Type III aortic dissection-  BPs better controlled. Continue current meds.  Hyperkalemia- improved.  Plan d/c home today. Will need close outpatient followup of BPs.    LOS: 8 days    Rolly Magri H 02/08/2014

## 2014-02-15 ENCOUNTER — Telehealth: Payer: Self-pay

## 2014-02-15 ENCOUNTER — Ambulatory Visit: Payer: Self-pay | Admitting: Cardiovascular Disease

## 2014-02-15 ENCOUNTER — Encounter: Payer: Self-pay | Admitting: Cardiovascular Disease

## 2014-02-15 ENCOUNTER — Ambulatory Visit (INDEPENDENT_AMBULATORY_CARE_PROVIDER_SITE_OTHER): Payer: BC Managed Care – PPO | Admitting: Cardiovascular Disease

## 2014-02-15 VITALS — BP 116/62 | HR 81 | Ht 64.0 in | Wt 133.5 lb

## 2014-02-15 DIAGNOSIS — I10 Essential (primary) hypertension: Secondary | ICD-10-CM

## 2014-02-15 DIAGNOSIS — I71019 Dissection of thoracic aorta, unspecified: Secondary | ICD-10-CM

## 2014-02-15 DIAGNOSIS — I5042 Chronic combined systolic (congestive) and diastolic (congestive) heart failure: Secondary | ICD-10-CM

## 2014-02-15 DIAGNOSIS — N179 Acute kidney failure, unspecified: Secondary | ICD-10-CM | POA: Insufficient documentation

## 2014-02-15 DIAGNOSIS — R0789 Other chest pain: Secondary | ICD-10-CM

## 2014-02-15 DIAGNOSIS — B370 Candidal stomatitis: Secondary | ICD-10-CM | POA: Insufficient documentation

## 2014-02-15 DIAGNOSIS — I7101 Dissection of thoracic aorta: Secondary | ICD-10-CM

## 2014-02-15 DIAGNOSIS — M549 Dorsalgia, unspecified: Secondary | ICD-10-CM

## 2014-02-15 LAB — BASIC METABOLIC PANEL
ANION GAP: 5 — AB (ref 7–16)
BUN: 56 mg/dL — AB (ref 7–18)
Calcium, Total: 8.7 mg/dL (ref 8.5–10.1)
Chloride: 105 mmol/L (ref 98–107)
Co2: 26 mmol/L (ref 21–32)
Creatinine: 2.55 mg/dL — ABNORMAL HIGH (ref 0.60–1.30)
EGFR (African American): 25 — ABNORMAL LOW
GFR CALC NON AF AMER: 21 — AB
Glucose: 128 mg/dL — ABNORMAL HIGH (ref 65–99)
Osmolality: 289 (ref 275–301)
POTASSIUM: 4.6 mmol/L (ref 3.5–5.1)
Sodium: 136 mmol/L (ref 136–145)

## 2014-02-15 MED ORDER — FLUCONAZOLE 150 MG PO TABS: 150.0000 mg | ORAL_TABLET | Freq: Every day | ORAL | Status: DC

## 2014-02-15 NOTE — Assessment & Plan Note (Signed)
Blood pressure is low on today's visit. We have suggested they hold the hydralazine for systolic pressure less than 110. Blood pressure low likely from dehydration. No other medication changes made at this time. As blood pressure elevated at 3 PM, recommended she take her labetalol and hydralazine at 1 to 2  PM

## 2014-02-15 NOTE — Assessment & Plan Note (Signed)
Baseline creatinine 1.5 on discharge. Creatinine today 2.55 with BUN significantly elevated at 56. Likely from dehydration. Recommended she hold her Lasix and potassium, increase her fluid intake

## 2014-02-15 NOTE — Telephone Encounter (Signed)
Attempted to contact pt to notify her of BMET results.  Pt's cell vm is full, unable to leave message. Work # has been disconnected.  No answer at mother's number.  Husband answered his cell asking me to call back in 6 mins, as he will be with her then.

## 2014-02-15 NOTE — Patient Instructions (Addendum)
Hold the lasix and potassium until your fluid intake picks up back to normal  Please take one more dose of diflucan  Try to drink more  Please take the hydralazine and labetolol at 1 to 2 pm (not 3 pm) Then 9 to 10 pm for evening dose  Hold the hydralazine for SBP <110  Please call us if you have new issues that need to be addressed before your next appt.  Your physician wants you to follow-up in: 1 month.

## 2014-02-15 NOTE — Assessment & Plan Note (Signed)
She reports having chronic back pain. She has had a long history of chronic back issues. Previously maintained on chronic opiate medication

## 2014-02-15 NOTE — Progress Notes (Signed)
Patient ID: Lisa Leblanc, female    DOB: Dec 09, 1960, 53 y.o.   MRN: [id number]  HPI Comments: Ms. Lisa Leblanc is a 53 year old woman, patient of Dr. Charlette Caffey, long history of smoking, COPD, malignant hypertension, on chronic opioid management for severe back pain, type 2 diabetes,   hospitalization 05/29/2013 with discharge 05/30/2013. She was admitted with new onset acute systolic heart failure, uncontrolled hypertension,  elevated troponin from demand ischemia. Cardiac arrest during back surgery April 2015 requiring CPR, surgery aborted, extubated 07/01/2013. Severe hypertension at the time.   She presents after recent discharge from the hospital at [facility] admitted 01/31/2014, discharge 02/08/2014 with a descending aorta dissection. She presented with acute back pain, dissection confirmed on MRI extending from beyond the takeoff of the left subclavian distally. Renal arteries were not compromised  In follow-up today, she presents with a visiting nurse who is helping to take care of her. She was given Diflucan by Dr. Charlette Caffey for possible thrush. She's having difficulty swallowing, reports having white in the back of her throat. This has improved after one dose but still having residual discomfort and problems swallowing.  Her weight has dropped from 160 pounds on her prior clinic visit, down to 134 pounds. She reports that she is not eating, not drinking much. She was discharged from the hospital on Lasix with potassium and she has been taking this on a regular basis. Unclear why she is not eating. Possibly depressed. She reports that she just does not feel like eating or drinking She states that she has no energy, feels worse than when she was in the hospital. Blood pressure low on today's visit. Her nursing aide reports it has been high at 3:00 in the afternoon. She has been having some chest and back discomfort it has been mild, constant. Denies any nausea vomiting, sweating or shortness of  breath.  EKG shows normal sinus rhythm with rate 82 bpm, no significant ST or T-wave changes  Other past medical history Recent hospital admission in March 2015, she presented with respiratory distress, placed on BiPAP, had elevated BNP, chest x-ray consistent with heart failure. She was given aggressive diuresis with several liters of fluid removed. Creatinine climbed and losartan was held. She reports having prior stress test and was told that this was normal. In 2014  Echocardiogram 05/30/2013 shows ejection fraction 30-35%, moderate LVH, normal right ventricular systolic pressures Stress test in June 2014, pharmacologic Myoview, showing no significant ischemia, ejection fraction 51%   Outpatient Encounter Prescriptions as of 02/15/2014  Medication Sig  . albuterol (PROVENTIL HFA;VENTOLIN HFA) 108 (90 BASE) MCG/ACT inhaler Inhale 2 puffs into the lungs every 6 (six) hours as needed for wheezing or shortness of breath.  . Biotin 10 MG CAPS Take by mouth daily.  . Cholecalciferol (VITAMIN D-3 PO) Take 1,000 mg by mouth daily.  . cyclobenzaprine (FLEXERIL) 10 MG tablet Take 10 mg by mouth daily as needed for muscle spasms.   . fluconazole (DIFLUCAN) 150 MG tablet Take 1 tablet (150 mg total) by mouth daily.  . furosemide (LASIX) 40 MG tablet Take 1 tablet (40 mg total) by mouth daily.  . hydrALAZINE (APRESOLINE) 100 MG tablet Take 1 tablet (100 mg total) by mouth 3 (three) times daily. (Patient taking differently: Take 100 mg by mouth 4 (four) times daily. )  . labetalol (NORMODYNE) 300 MG tablet Take 300 mg by mouth 3 (three) times daily.  . Linaclotide (LINZESS) 145 MCG CAPS capsule Take 145 mcg by mouth daily.  Marland Kitchen  mometasone-formoterol (DULERA) 100-5 MCG/ACT AERO Inhale 2 puffs into the lungs as needed for wheezing.  . montelukast (SINGULAIR) 10 MG tablet Take 10 mg by mouth at bedtime.  . Multiple Vitamin (MULTIVITAMIN WITH MINERALS) TABS tablet Take 1 tablet by mouth daily.  .  ondansetron (ZOFRAN) 8 MG tablet Take 8 mg by mouth every 8 (eight) hours as needed for nausea or vomiting.  . Oxycodone HCl 20 MG TABS Take by mouth as needed.  . potassium chloride (K-DUR) 10 MEQ tablet Take 1 tablet (10 mEq total) by mouth daily.  . ranitidine (ZANTAC) 75 MG tablet Take 150 mg by mouth daily.  Marland Kitchen. senna-docusate (SENOKOT-S) 8.6-50 MG per tablet Take 1 tablet by mouth 2 (two) times daily.   Social history  reports that she has been smoking Cigarettes.  She has a 15 pack-year smoking history. She has never used smokeless tobacco. She reports that she does not drink alcohol or use illicit drugs.   Past medical history   has a past medical history of Obesity; Hypertension; Lumbosacral neuritis; Chronic pain; Narcotic dependence; Cataract; Diabetes mellitus; Pure hypercholesterolemia; Syncope and collapse; Chronic systolic CHF (congestive heart failure), NYHA class 2; Cardiac arrest; and NICM (nonischemic cardiomyopathy).  Review of Systems  Constitutional: Positive for fatigue and unexpected weight change.  Respiratory: Negative.   Cardiovascular: Negative.   Musculoskeletal: Positive for back pain.  Neurological: Negative.   Hematological: Negative.   Psychiatric/Behavioral: Positive for dysphoric mood.  All other systems reviewed and are negative.   BP 116/62 mmHg  Pulse 81  Ht 5\' 4"  (1.626 m)  Wt 133 lb 8 oz (60.555 kg)  BMI 22.90 kg/m2  Physical Exam  Constitutional: She is oriented to person, place, and time. She appears well-developed and well-nourished.  HENT:  Head: Normocephalic.  Nose: Nose normal.  Mouth/Throat: Oropharynx is clear and moist.  Eyes: Conjunctivae are normal. Pupils are equal, round, and reactive to light.  Neck: Normal range of motion. Neck supple. No JVD present.  Cardiovascular: Normal rate, regular rhythm, S1 normal, S2 normal, normal heart sounds and intact distal pulses.  Exam reveals no gallop and no friction rub.   No murmur  heard. Pulmonary/Chest: Effort normal and breath sounds normal. No respiratory distress. She has no wheezes. She has no rales. She exhibits no tenderness.  Abdominal: Soft. Bowel sounds are normal. She exhibits no distension. There is no tenderness.  Musculoskeletal: Normal range of motion. She exhibits no edema or tenderness.  Lymphadenopathy:    She has no cervical adenopathy.  Neurological: She is alert and oriented to person, place, and time. Coordination normal.  Skin: Skin is warm and dry. No rash noted. No erythema.  Psychiatric: Her behavior is normal. Judgment and thought content normal.  Appears depressed  Assessment and Plan  Nursing note and vitals reviewed.

## 2014-02-15 NOTE — Assessment & Plan Note (Signed)
She had a does of Diflucan yesterday. She reports having continued white spots in the back of her throat, difficulty swallowing, throat irritation. We will give her 1 more pill, Diflucan 150 mg 1

## 2014-02-15 NOTE — Telephone Encounter (Signed)
Spoke w/ pt.  Advised her to increase her fluids and continue to hold the lasix and K+ as discussed at her ov today.  She verbalizes understanding and will call back w/ any further questions or concerns.

## 2014-02-15 NOTE — Assessment & Plan Note (Signed)
We have ordered a basic metabolic panel. Suggested she hold her Lasix and potassium as she appears dehydrated and is not eating or drinking well.  Addendum Lab work returned showing creatinine 2.55, BUN 56. Confirmed with her that she will hold her Lasix with potassium, encouraged by mouth fluid intake. Visiting nurse will help her with this

## 2014-02-15 NOTE — Assessment & Plan Note (Signed)
Recent discharge from the hospital November 18, approximately one week ago. She has general malaise, appears depressed, not eating or drinking. No further intervention needed for her aortic dissection. Difficult to determine what is her chronic back pain and what is discomfort from the dissection. She is on pain medication

## 2014-02-20 ENCOUNTER — Other Ambulatory Visit: Payer: Self-pay

## 2014-02-20 DIAGNOSIS — R0789 Other chest pain: Secondary | ICD-10-CM

## 2014-02-21 ENCOUNTER — Ambulatory Visit (INDEPENDENT_AMBULATORY_CARE_PROVIDER_SITE_OTHER): Payer: BC Managed Care – PPO | Admitting: Thoracic Surgery (Cardiothoracic Vascular Surgery)

## 2014-02-21 ENCOUNTER — Other Ambulatory Visit: Payer: Self-pay | Admitting: Thoracic Surgery (Cardiothoracic Vascular Surgery)

## 2014-02-21 ENCOUNTER — Encounter: Payer: Self-pay | Admitting: Thoracic Surgery (Cardiothoracic Vascular Surgery)

## 2014-02-21 ENCOUNTER — Ambulatory Visit
Admission: RE | Admit: 2014-02-21 | Discharge: 2014-02-21 | Disposition: A | Discharge status: Home / Self Care | Payer: BC Managed Care – PPO | Source: Ambulatory Visit | Attending: Thoracic Surgery (Cardiothoracic Vascular Surgery) | Admitting: Thoracic Surgery (Cardiothoracic Vascular Surgery)

## 2014-02-21 VITALS — BP 121/74 | HR 75 | Resp 16 | Ht 64.0 in | Wt 125.0 lb

## 2014-02-21 DIAGNOSIS — I71012 Dissection of descending thoracic aorta: Secondary | ICD-10-CM

## 2014-02-21 DIAGNOSIS — I7101 Dissection of thoracic aorta: Secondary | ICD-10-CM

## 2014-02-21 DIAGNOSIS — I7100 Dissection of unspecified site of aorta: Secondary | ICD-10-CM
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
## 2014-02-22 ENCOUNTER — Telehealth: Payer: Self-pay | Admitting: Cardiovascular Disease

## 2014-02-22 NOTE — Telephone Encounter (Signed)
Would start amlodipine Write prescription for 10 mg Would cut the pill in half for the first week If blood pressure continues to run high, will take a full pill Would continue to monitor blood pressure and call us with numbers Needs follow-up office visit

## 2014-02-22 NOTE — Telephone Encounter (Signed)
Spoke w/ pt.  She reports that her BP has been running high, systolic in the 180s and diastolic 117-120 range. Reports that diastolic will return to the 80s after hydralazine, but this doesn't last long.  Reports that her fingertips are numb and she feels shaky and anxious.  Advised her that I will make Dr. Mariah Milling aware and call her back w/ his recommendation.

## 2014-02-22 NOTE — Telephone Encounter (Signed)
Pt has had a high BP the past few days. Around 184/117. Pt home health nurse wanted pt to call in and let us know.

## 2014-02-23 MED ORDER — AMLODIPINE BESYLATE 10 MG PO TABS: 10.0000 mg | ORAL_TABLET | Freq: Every day | ORAL | Status: DC

## 2014-02-23 NOTE — Telephone Encounter (Signed)
Spoke w/ pt.  Advised her of Dr. Windell Hummingbird recommendation.  She verbalizes understanding and is agreeable.  She reports that she may have some Norvasc at her home, but asked that I go ahead and send in rx. Schedule pt 1 mo f/u for 03/27/14. Asked her to call back w/ BP numbers or if she has any further questions or concerns.

## 2014-02-28 ENCOUNTER — Other Ambulatory Visit: Payer: Self-pay | Admitting: Cardiovascular Disease

## 2014-03-01 ENCOUNTER — Other Ambulatory Visit: Payer: Self-pay

## 2014-03-01 MED ORDER — CLONIDINE HCL 0.1 MG/24HR TD PTWK: 0.1000 mg | MEDICATED_PATCH | TRANSDERMAL | Status: DC

## 2014-03-03 ENCOUNTER — Telehealth: Payer: Self-pay

## 2014-03-03 NOTE — Telephone Encounter (Signed)
Pt called with BP readings: This morning was 168/117, just now (after medication-)191/125

## 2014-03-03 NOTE — Telephone Encounter (Signed)
Spoke w/ pt.  Advised her that per Dr. Mariah Milling, it is okay for her to increase her clonidine dose by applying another patch in addition to the one she is already wearing.  She verbalizes understanding and is agreeable.  Asked pt to call back this afternoon and let me know her BP reading.  She is agreeable to this, as well.

## 2014-03-03 NOTE — Telephone Encounter (Signed)
Spoke w/ pt.  She reports BP last night 160/110, she took a hydralazine that brought it down.  This am 168/117, she took her am meds and BP now 191/125. She states that she does not feel that the clonidine 0.1 mg patch is strong enough.  Advised pt to take another hydralazine and I will make Dr. Mariah Milling aware of her readings.  Please advise.  Thank you.

## 2014-03-06 ENCOUNTER — Telehealth: Payer: Self-pay

## 2014-03-06 MED ORDER — CLONIDINE HCL 0.1 MG/24HR TD PTWK: 0.2000 mg | MEDICATED_PATCH | TRANSDERMAL | Status: DC

## 2014-03-06 NOTE — Telephone Encounter (Signed)
Lisa Leblanc at 03/06/2014 10:09 AM     Status: Signed       Expand All Collapse All   Pt states her BP is still high: 12-13 pm, 183/109 12-13 am, 195/118 12-14 am, 172/110 12-14 am, 194/117(most recent, about an hour ago)

## 2014-03-06 NOTE — Telephone Encounter (Signed)
Pt states her BP is still high: 12-13 pm, 183/109 12-13 am, 195/118 12-14 am, 172/110 12-14 am, 194/117(most recent, about an hour ago)

## 2014-03-06 NOTE — Telephone Encounter (Signed)
Please see previous phone note.  

## 2014-03-06 NOTE — Telephone Encounter (Signed)
Until we can increase her clonidine patches on her next prescription, Would take hydralazine 4 times per day, breakfast, lunch, dinner and before bed Would also take Lasix Monday Wednesday Friday Continue on clonidine patch 2  For renewal of clonidine patch, would increase up to 0.3 does  Has she tried amlodipine before?

## 2014-03-06 NOTE — Telephone Encounter (Signed)
Pt is currently wearing two 0.1 mg clonidine patches for a total of 0.2 mg.  Would you like her to increase to 0.3 mg?

## 2014-03-07 MED ORDER — CLONIDINE HCL 0.3 MG/24HR TD PTWK: 0.3000 mg | MEDICATED_PATCH | TRANSDERMAL | Status: DC

## 2014-03-07 NOTE — Telephone Encounter (Signed)
Spoke w/ Lisa Leblanc.  Advised her of Dr. Windell Hummingbird recommendation.  She is currently taking amlodipine 10mg  w/ her clonidine.  She reports that her rx for clonidine 0.2 mg runs out today, so she would like 0.3 mg sent in.  She reports that she just started back on lasix last night, has a "small" amount" of swelling in her ankles, and that her home health nurse reports SpO2 81-88% on room air.  She states that she was previously on O2,but the machine took up too much room in her home, so she had it taken out.  Lisa Leblanc states that she does not weigh herself daily. Discussed this w/ Lisa Leblanc and she will start doing so and keeping a log.   Lisa Leblanc to call back if sx worsen, as we may need to adjust her lasix.

## 2014-03-09 ENCOUNTER — Telehealth: Payer: Self-pay | Admitting: Cardiovascular Disease

## 2014-03-09 NOTE — Telephone Encounter (Signed)
Nurse who comes out to see patient saw that pt patch for blood pressure was off, needs to know if she can stick that one back on or put a new one on.

## 2014-03-09 NOTE — Telephone Encounter (Signed)
I instructed patient (per verbal from Dr. Kirke Corin) to put a new patch on  Patient verbalized understanding

## 2014-03-20 ENCOUNTER — Telehealth: Payer: Self-pay | Admitting: Cardiovascular Disease

## 2014-03-20 NOTE — Telephone Encounter (Signed)
Spoke with patient  She stated she is sob and having left sided chest pressure  She also complains of sweating  She feels she developed a cold last night but the chest pain had started before that  Patient described pain as a rubber band around her heart  Patient sounded distressed over the phone   I instructed patient to go to the ED now  Patient verbalized understanding  Home health nurse with patient

## 2014-03-20 NOTE — Telephone Encounter (Signed)
Pt c/o of Chest Pain: STAT if CP now or developed within 24 hours  1. Are you having CP right now? No right now, they come and go 2. Are you experiencing any other symptoms (ex. SOB, nausea, vomiting, sweating)?  SOB,tightness, and cough 3. How long have you been experiencing CP?  Yesterday morning though she was going to call er but did not. Did not want to go hospital but it went away.  4. Is your CP continuous or coming and going?  Coming and going  5. Have you taken Nitroglycerin?  She does not have any   Home nurse suggested to ask if she should take Asprin.

## 2014-03-27 ENCOUNTER — Ambulatory Visit (INDEPENDENT_AMBULATORY_CARE_PROVIDER_SITE_OTHER): Payer: No Typology Code available for payment source | Admitting: Cardiovascular Disease

## 2014-03-27 ENCOUNTER — Encounter: Payer: Self-pay | Admitting: Cardiovascular Disease

## 2014-03-27 VITALS — BP 146/92 | HR 80 | Temp 98.4°F | Ht 63.0 in | Wt 147.2 lb

## 2014-03-27 DIAGNOSIS — I71019 Dissection of thoracic aorta, unspecified: Secondary | ICD-10-CM

## 2014-03-27 DIAGNOSIS — R0602 Shortness of breath: Secondary | ICD-10-CM

## 2014-03-27 DIAGNOSIS — R0789 Other chest pain: Secondary | ICD-10-CM

## 2014-03-27 DIAGNOSIS — R079 Chest pain, unspecified: Secondary | ICD-10-CM

## 2014-03-27 DIAGNOSIS — I10 Essential (primary) hypertension: Secondary | ICD-10-CM

## 2014-03-27 DIAGNOSIS — I5022 Chronic systolic (congestive) heart failure: Secondary | ICD-10-CM

## 2014-03-27 DIAGNOSIS — I7101 Dissection of thoracic aorta: Secondary | ICD-10-CM

## 2014-03-27 DIAGNOSIS — E118 Type 2 diabetes mellitus with unspecified complications: Secondary | ICD-10-CM

## 2014-03-27 MED ORDER — NITROGLYCERIN 0.4 MG SL SUBL: 0.4000 mg | SUBLINGUAL_TABLET | SUBLINGUAL | Status: AC | PRN

## 2014-03-27 NOTE — Assessment & Plan Note (Signed)
Recent severe weight loss, now trending back upward. Recommended she closely monitor her diet

## 2014-03-27 NOTE — Assessment & Plan Note (Signed)
Chronic shortness of breath. Likely from COPD. Recommended smoking cessation

## 2014-03-27 NOTE — Progress Notes (Signed)
Patient ID: Lisa Leblanc, female    DOB: 04-25-1960, 54 y.o.   MRN: [id number]  HPI Comments: Lisa Leblanc is a 54 year old woman, patient of Dr. Charlette Caffey, long history of smoking, COPD, malignant hypertension, on chronic opioid management for severe back pain, type 2 diabetes,   hospitalization 05/29/2013 with discharge 05/30/2013. She was admitted with new onset acute systolic heart failure, uncontrolled hypertension,  elevated troponin from demand ischemia. Cardiac arrest during back surgery April 2015 requiring CPR, surgery aborted, extubated 07/01/2013. Severe hypertension at the time.  She presents for follow-up of her hypertension   recent discharge from the hospital at [facility] admitted 01/31/2014, discharge 02/08/2014 with a descending thoracic aorta dissection. She presented with acute back pain, dissection confirmed on MRI extending from beyond the takeoff of the left subclavian distally. Renal arteries were not compromised  On her last clinic visit, she was not eating well, weight was down to 134 pounds, had thrush. Was given Diflucan 2 with improvement of her symptoms. Now eating better, weight is up 10 pounds Blood pressure has been climbing now 150, 160 systolic Hydralazine previously held in the setting of low weight and low blood pressure. She is very concerned about her dissection and her future risk. Also reports having chest pain if she does not take pain medications, also chronic shortness of breath. She continues to smoke heavily. Recent a seen by cardiothoracic surgery. MRA of the chest recommended in several months time  EKG on today's visit shows normal sinus rhythm with rate 80 bpm, no significant ST or T-wave changes  Other past medical history Recent hospital admission in March 2015, she presented with respiratory distress, placed on BiPAP, had elevated BNP, chest x-ray consistent with heart failure. She was given aggressive diuresis with several liters of fluid  removed. Creatinine climbed and losartan was held. She reports having prior stress test and was told that this was normal. In 2014  Echocardiogram 05/30/2013 shows ejection fraction 30-35%, moderate LVH, normal right ventricular systolic pressures Stress test in June 2014, pharmacologic Myoview, showing no significant ischemia, ejection fraction 51%   Outpatient Encounter Prescriptions as of 03/27/2014  Medication Sig  . albuterol (PROVENTIL HFA;VENTOLIN HFA) 108 (90 BASE) MCG/ACT inhaler Inhale 2 puffs into the lungs every 6 (six) hours as needed for wheezing or shortness of breath.  Marland Kitchen amLODipine (NORVASC) 10 MG tablet Take 1 tablet (10 mg total) by mouth daily.  . Biotin 10 MG CAPS Take by mouth daily.  . Cholecalciferol (VITAMIN D-3 PO) Take 1,000 mg by mouth daily.  . cloNIDine (CATAPRES - DOSED IN MG/24 HR) 0.3 mg/24hr patch Place 1 patch (0.3 mg total) onto the skin once a week.  . cyclobenzaprine (FLEXERIL) 10 MG tablet Take 10 mg by mouth daily as needed for muscle spasms.   . furosemide (LASIX) 40 MG tablet Take 1 tablet (40 mg total) by mouth daily.  . hydrALAZINE (APRESOLINE) 100 MG tablet Take 1 tablet (100 mg total) by mouth 3 (three) times daily. (Patient taking differently: Take 100 mg by mouth 3 (three) times daily as needed. )  . labetalol (NORMODYNE) 300 MG tablet Take 300 mg by mouth 3 (three) times daily.  . Linaclotide (LINZESS) 145 MCG CAPS capsule Take 145 mcg by mouth daily.  . mometasone-formoterol (DULERA) 100-5 MCG/ACT AERO Inhale 2 puffs into the lungs as needed for wheezing.  . montelukast (SINGULAIR) 10 MG tablet Take 10 mg by mouth at bedtime.  . Multiple Vitamin (MULTIVITAMIN WITH MINERALS) TABS tablet Take  1 tablet by mouth daily.  . ondansetron (ZOFRAN) 8 MG tablet Take 8 mg by mouth every 8 (eight) hours as needed for nausea or vomiting.  . Oxycodone HCl 20 MG TABS Take by mouth as needed.  . OXYCONTIN 40 MG T12A 12 hr tablet Take 40 mg by mouth every 12  (twelve) hours.  . potassium chloride (K-DUR) 10 MEQ tablet Take 1 tablet (10 mEq total) by mouth daily.  . ranitidine (ZANTAC) 75 MG tablet Take 150 mg by mouth daily.  Marland Kitchen senna-docusate (SENOKOT-S) 8.6-50 MG per tablet Take 1 tablet by mouth 2 (two) times daily.      Social history  reports that she has been smoking Cigarettes.  She has a 15 pack-year smoking history. She has never used smokeless tobacco. She reports that she does not drink alcohol or use illicit drugs.   Past medical history   has a past medical history of Obesity; Hypertension; Lumbosacral neuritis; Chronic pain; Narcotic dependence; Cataract; Diabetes mellitus; Pure hypercholesterolemia; Syncope and collapse; Chronic systolic CHF (congestive heart failure), NYHA class 2; Cardiac arrest; NICM (nonischemic cardiomyopathy); and Aortic dissection, thoracic.  Review of Systems  Constitutional: Positive for fatigue.  Respiratory: Positive for chest tightness and shortness of breath.   Cardiovascular: Positive for chest pain.  Gastrointestinal: Negative.   Musculoskeletal: Positive for back pain.  Neurological: Negative.   Hematological: Negative.   Psychiatric/Behavioral: Negative.   All other systems reviewed and are negative.   BP 146/92 mmHg  Pulse 80  Temp(Src) 98.4 F (36.9 C)  Ht 5\' 3"  (1.6 m)  Wt 147 lb 4 oz (66.792 kg)  BMI 26.09 kg/m2  SpO2 100%  Physical Exam  Constitutional: She is oriented to person, place, and time. She appears well-developed and well-nourished.  HENT:  Head: Normocephalic.  Nose: Nose normal.  Mouth/Throat: Oropharynx is clear and moist.  Eyes: Conjunctivae are normal. Pupils are equal, round, and reactive to light.  Neck: Normal range of motion. Neck supple. No JVD present.  Cardiovascular: Normal rate, regular rhythm, S1 normal, S2 normal, normal heart sounds and intact distal pulses.  Exam reveals no gallop and no friction rub.   No murmur heard. Pulmonary/Chest: Effort  normal and breath sounds normal. No respiratory distress. She has no wheezes. She has no rales. She exhibits no tenderness.  Abdominal: Soft. Bowel sounds are normal. She exhibits no distension. There is no tenderness.  Musculoskeletal: Normal range of motion. She exhibits no edema or tenderness.  Lymphadenopathy:    She has no cervical adenopathy.  Neurological: She is alert and oriented to person, place, and time. Coordination normal.  Skin: Skin is warm and dry. No rash noted. No erythema.  Psychiatric: She has a normal mood and affect. Her behavior is normal. Judgment and thought content normal.  Appears depressed  Assessment and Plan  Nursing note and vitals reviewed.

## 2014-03-27 NOTE — Assessment & Plan Note (Signed)
Blood pressure running high. We'll restart hydralazine 50 mg 3 times a day Stress to her the importance of good blood pressure control

## 2014-03-27 NOTE — Patient Instructions (Addendum)
Please ask primary care for oxygen Take nitro under the tongue for chest pain If chest pain does not improve with oxygen, call the office   Please restart hydralazine 50 mg three times a day  Please call us if you have new issues that need to be addressed before your next appt.  Your physician wants you to follow-up in: 3 months.  You will receive a reminder letter in the mail two months in advance. If you don't receive a letter, please call our office to schedule the follow-up appointment.

## 2014-03-27 NOTE — Assessment & Plan Note (Signed)
Recent seen by cardiothoracic surgery. Recommendation was for medical management at this time, repeat imaging with MRA at later date

## 2014-03-27 NOTE — Assessment & Plan Note (Signed)
She appears relatively euvolemic on today's visit. She is on Lasix 40 mg daily

## 2014-03-27 NOTE — Telephone Encounter (Signed)
Pt seen today

## 2014-03-27 NOTE — Assessment & Plan Note (Signed)
She continues to have pain in her sternum, relieved with pain medication. Unable to exclude ischemia. We'll wait for chest MRA as this would likely show underlying coronary artery disease

## 2014-04-25 ENCOUNTER — Telehealth: Payer: Self-pay | Admitting: Cardiovascular Disease

## 2014-04-25 ENCOUNTER — Encounter: Payer: Self-pay | Admitting: Physician Assistant

## 2014-04-25 ENCOUNTER — Ambulatory Visit (INDEPENDENT_AMBULATORY_CARE_PROVIDER_SITE_OTHER): Payer: No Typology Code available for payment source | Admitting: Physician Assistant

## 2014-04-25 ENCOUNTER — Inpatient Hospital Stay: Payer: Self-pay | Admitting: Internal Medicine

## 2014-04-25 VITALS — BP 154/92 | HR 106 | Ht 64.0 in | Wt 178.8 lb

## 2014-04-25 DIAGNOSIS — I10 Essential (primary) hypertension: Secondary | ICD-10-CM

## 2014-04-25 DIAGNOSIS — I71012 Dissection of descending thoracic aorta: Secondary | ICD-10-CM

## 2014-04-25 DIAGNOSIS — I7101 Dissection of thoracic aorta: Secondary | ICD-10-CM

## 2014-04-25 DIAGNOSIS — R0602 Shortness of breath: Secondary | ICD-10-CM

## 2014-04-25 DIAGNOSIS — N179 Acute kidney failure, unspecified: Secondary | ICD-10-CM

## 2014-04-25 DIAGNOSIS — E669 Obesity, unspecified: Secondary | ICD-10-CM

## 2014-04-25 DIAGNOSIS — R0789 Other chest pain: Secondary | ICD-10-CM

## 2014-04-25 DIAGNOSIS — I5043 Acute on chronic combined systolic (congestive) and diastolic (congestive) heart failure: Secondary | ICD-10-CM

## 2014-04-25 LAB — CBC WITH DIFFERENTIAL/PLATELET
BASOS ABS: 0.1 10*3/uL (ref 0.0–0.1)
BASOS PCT: 0.4 %
Eosinophil #: 0 10*3/uL (ref 0.0–0.7)
Eosinophil %: 0 %
HCT: 31.3 % — ABNORMAL LOW (ref 35.0–47.0)
HGB: 10.3 g/dL — ABNORMAL LOW (ref 12.0–16.0)
Lymphocyte #: 0.8 10*3/uL — ABNORMAL LOW (ref 1.0–3.6)
Lymphocyte %: 5.1 %
MCH: 28.8 pg (ref 26.0–34.0)
MCHC: 32.8 g/dL (ref 32.0–36.0)
MCV: 88 fL (ref 80–100)
MONO ABS: 0.9 x10 3/mm (ref 0.2–0.9)
Monocyte %: 5.3 %
NEUTROS ABS: 14.6 10*3/uL — AB (ref 1.4–6.5)
NEUTROS PCT: 89.2 %
Platelet: 281 10*3/uL (ref 150–440)
RBC: 3.57 10*6/uL — ABNORMAL LOW (ref 3.80–5.20)
RDW: 21.7 % — ABNORMAL HIGH (ref 11.5–14.5)
WBC: 16.4 10*3/uL — ABNORMAL HIGH (ref 3.6–11.0)

## 2014-04-25 LAB — BASIC METABOLIC PANEL
ANION GAP: 6 — AB (ref 7–16)
BUN: 37 mg/dL — AB (ref 7–18)
Calcium, Total: 8.5 mg/dL (ref 8.5–10.1)
Chloride: 106 mmol/L (ref 98–107)
Co2: 30 mmol/L (ref 21–32)
Creatinine: 1.56 mg/dL — ABNORMAL HIGH (ref 0.60–1.30)
EGFR (African American): 44 — ABNORMAL LOW
GFR CALC NON AF AMER: 37 — AB
GLUCOSE: 109 mg/dL — AB (ref 65–99)
Osmolality: 292 (ref 275–301)
Potassium: 4 mmol/L (ref 3.5–5.1)
Sodium: 142 mmol/L (ref 136–145)

## 2014-04-25 LAB — CK TOTAL AND CKMB (NOT AT ARMC)
CK, TOTAL: 105 U/L (ref 26–192)
CK-MB: 2.8 ng/mL (ref 0.5–3.6)

## 2014-04-25 LAB — PRO B NATRIURETIC PEPTIDE: B-TYPE NATIURETIC PEPTID: 738 pg/mL — AB (ref 0–125)

## 2014-04-25 LAB — TROPONIN I: Troponin-I: <0.02 ng/mL

## 2014-04-25 NOTE — Telephone Encounter (Signed)
Medication list updated.
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
## 2014-04-25 NOTE — Telephone Encounter (Signed)
Pt sched to see Eula Listen, PA today at 1:45.

## 2014-04-25 NOTE — Telephone Encounter (Signed)
Pt is calling stating that we need to fix the medication list.   Changed meds.   oxycotin is now  opnana 20 mg    please call if we have any questions.

## 2014-04-25 NOTE — Telephone Encounter (Signed)
Pt c/o swelling: STAT is pt has developed SOB within 24 hours  1. How long have you been experiencing swelling? 1-2 weeks  2. Where is the swelling located? feet  3.  Are you currently taking a "fluid pill"? Yes only is suppose to take 40 a day but has been taking 80 a day.  4.  Are you currently SOB? Yes,she states when she sleeps she can't breath  5.  Have you traveled recently? No  Pt also states she has had a bad cold. This is not helping either.   Pt c/o of Chest Pain: STAT if CP now or developed within 24 hours  1. Are you having CP right now? yes  2. Are you experiencing any other symptoms (ex. SOB, nausea, vomiting, sweating)? Just swelling and SOB  3. How long have you been experiencing CP? 1-2 weeks   4. Is your CP continuous or coming and going? Coming and going   5. Have you taken Nitroglycerin? No, has some but has never needed it.  ?

## 2014-04-25 NOTE — Patient Instructions (Signed)
[facility] for direct admission to room 234

## 2014-04-25 NOTE — Addendum Note (Signed)
Addended by: Fransico Setters on: 04/25/2014 05:18 PM   Modules accepted: Medications

## 2014-04-26 DIAGNOSIS — I5043 Acute on chronic combined systolic (congestive) and diastolic (congestive) heart failure: Secondary | ICD-10-CM

## 2014-04-26 DIAGNOSIS — I313 Pericardial effusion (noninflammatory): Secondary | ICD-10-CM

## 2014-04-26 DIAGNOSIS — I10 Essential (primary) hypertension: Secondary | ICD-10-CM

## 2014-04-26 LAB — BASIC METABOLIC PANEL
Anion Gap: 7 (ref 7–16)
BUN: 40 mg/dL — ABNORMAL HIGH (ref 7–18)
CHLORIDE: 105 mmol/L (ref 98–107)
CO2: 31 mmol/L (ref 21–32)
CREATININE: 1.69 mg/dL — AB (ref 0.60–1.30)
Calcium, Total: 8.6 mg/dL (ref 8.5–10.1)
EGFR (African American): 40 — ABNORMAL LOW
EGFR (Non-African Amer.): 33 — ABNORMAL LOW
Glucose: 118 mg/dL — ABNORMAL HIGH (ref 65–99)
Osmolality: 296 (ref 275–301)
Potassium: 3.7 mmol/L (ref 3.5–5.1)
Sodium: 143 mmol/L (ref 136–145)

## 2014-04-26 LAB — CK TOTAL AND CKMB (NOT AT ARMC)
CK, Total: 128 U/L (ref 26–192)
CK, Total: 110 U/L (ref 26–192)
CK-MB: 3.1 ng/mL (ref 0.5–3.6)
CK-MB: 3.1 ng/mL (ref 0.5–3.6)

## 2014-04-26 LAB — TROPONIN I
TROPONIN-I: 0.02 ng/mL
Troponin-I: 0.02 ng/mL

## 2014-04-27 DIAGNOSIS — I5031 Acute diastolic (congestive) heart failure: Secondary | ICD-10-CM

## 2014-04-28 LAB — BASIC METABOLIC PANEL
Anion Gap: 5 — ABNORMAL LOW (ref 7–16)
BUN: 47 mg/dL — ABNORMAL HIGH (ref 7–18)
CHLORIDE: 102 mmol/L (ref 98–107)
CO2: 34 mmol/L — AB (ref 21–32)
Calcium, Total: 8.4 mg/dL — ABNORMAL LOW (ref 8.5–10.1)
Creatinine: 1.64 mg/dL — ABNORMAL HIGH (ref 0.60–1.30)
EGFR (African American): 42 — ABNORMAL LOW
EGFR (Non-African Amer.): 35 — ABNORMAL LOW
Glucose: 114 mg/dL — ABNORMAL HIGH (ref 65–99)
Osmolality: 294 (ref 275–301)
POTASSIUM: 3.5 mmol/L (ref 3.5–5.1)
SODIUM: 141 mmol/L (ref 136–145)

## 2014-04-28 LAB — WBC: WBC: 15.6 10*3/uL — ABNORMAL HIGH (ref 3.6–11.0)

## 2014-05-01 ENCOUNTER — Telehealth: Payer: Self-pay

## 2014-05-01 NOTE — Telephone Encounter (Signed)
Attempted to contact pt regarding discharge from [facility] on 04/28/14. No answer, voicemail box is full.

## 2014-05-03 ENCOUNTER — Telehealth: Payer: Self-pay

## 2014-05-03 NOTE — Telephone Encounter (Signed)
Pt states she has gained 8 pounds in 1 day. States she has tightness all around her ribcage around her braline., on both sides.

## 2014-05-03 NOTE — Telephone Encounter (Signed)
Spoke w/ pt.  She reports 8 lb wt gain in 1 day. States that she is adhering to low sodium diet and limiting her fluids. Denies CP.  States that her face is so swollen that it looks like a balloon and she barely recognizes herself in the mirror.  Reports swelling under bra line that is painful to the touch. Pt's lasix was switched in the hospital to torsemide 20 mg BID.

## 2014-05-03 NOTE — Telephone Encounter (Signed)
Attempted to contact pt regarding discharge from [facility] on 04/28/14. No answer, voicemail box is full.  

## 2014-05-03 NOTE — Telephone Encounter (Signed)
Spoke w/ pt.  Advised her of Dr. Windell Hummingbird recommendation.  She verbalizes understanding and is agreeable.  On further discussion, pt states that she eats cereal daily, mostly [employer] Cheerios, and tries to eat baked chicken & eats at [facility] a lot. Advised pt that [employer] has a quite a bit of sodium in it's seasoning and to try to avoid take out food if she can. She is sched to see Clarisa Kindred, FNP in the HF clinic on 2/22, sched for TCM in our office on 2/18. Asked her to call back tomorrow and let me know how she is doing.  Pt's friend interrupted our phone conversation, stating that she a nurse and would like to know where in [place] pt can go for cardiac exercises.  Advised her that we use [facility] cardiac rehab, but pt will need referral.   She asks several questions about pt's test results and referrals that were made while pt was hospitalized. Advised her that I do not have access to pt's [facility] records and that we can address this at pt's ov and when we get her swelling down.

## 2014-05-03 NOTE — Telephone Encounter (Signed)
I would increase torsemide up to 40 mg twice a day If no significant improvement in urine output, would increase up to torsemide 60 mg twice a day She needs close follow-up in clinic Would back down on the torsemide dose once her weight drops back to her baseline

## 2014-05-05 ENCOUNTER — Telehealth: Payer: Self-pay

## 2014-05-05 NOTE — Telephone Encounter (Signed)
Poor diet seems like a major part of her recent weight gain, eating out too much Would recommend she continue torsemide 40 mg twice a day 60 mg twice a day if no improvement in her weight Would take with potassium 20 twice a day Needs close follow-up in heart failure clinic

## 2014-05-05 NOTE — Telephone Encounter (Signed)
Pt states he legs are still swollen, and she is experiencing chest tightness. She is taking 40 mg Furosemide. She is shaky and dizzy.

## 2014-05-05 NOTE — Telephone Encounter (Signed)
Spoke w/ pt.  Advised her of Dr. Windell Hummingbird recommendation.  She verbalizes understanding, states that she "will do whatever it takes to feel better".  Asked her to call on Monday if sx have not improved.

## 2014-05-05 NOTE — Telephone Encounter (Signed)
Lisa Leblanc at 05/05/2014 4:32 PM     Status: Signed       Expand All Collapse All   Pt states she is being able to urinate.            Lisa Leblanc at 05/05/2014 4:29 PM     Status: Signed       Expand All Collapse All   Pt states he legs are still swollen, and she is experiencing chest tightness. She is taking 40 mg Furosemide. She is shaky and dizzy.

## 2014-05-05 NOTE — Telephone Encounter (Signed)
See previous phone note.  

## 2014-05-05 NOTE — Telephone Encounter (Signed)
Pt states she is being able to urinate.

## 2014-05-08 ENCOUNTER — Encounter: Payer: Self-pay | Admitting: Physician Assistant

## 2014-05-08 DIAGNOSIS — I119 Hypertensive heart disease without heart failure: Secondary | ICD-10-CM | POA: Insufficient documentation

## 2014-05-08 DIAGNOSIS — K769 Liver disease, unspecified: Secondary | ICD-10-CM | POA: Insufficient documentation

## 2014-05-08 DIAGNOSIS — I422 Other hypertrophic cardiomyopathy: Secondary | ICD-10-CM

## 2014-05-08 DIAGNOSIS — N184 Chronic kidney disease, stage 4 (severe): Secondary | ICD-10-CM | POA: Insufficient documentation

## 2014-05-11 ENCOUNTER — Ambulatory Visit (INDEPENDENT_AMBULATORY_CARE_PROVIDER_SITE_OTHER): Payer: No Typology Code available for payment source | Admitting: Physician Assistant

## 2014-05-11 ENCOUNTER — Encounter: Payer: Self-pay | Admitting: Physician Assistant

## 2014-05-11 VITALS — BP 160/98 | HR 92 | Ht 64.0 in | Wt 172.0 lb

## 2014-05-11 DIAGNOSIS — I110 Hypertensive heart disease with heart failure: Secondary | ICD-10-CM

## 2014-05-11 DIAGNOSIS — T402X5A Adverse effect of other opioids, initial encounter: Secondary | ICD-10-CM

## 2014-05-11 DIAGNOSIS — M549 Dorsalgia, unspecified: Secondary | ICD-10-CM

## 2014-05-11 DIAGNOSIS — K769 Liver disease, unspecified: Secondary | ICD-10-CM

## 2014-05-11 DIAGNOSIS — K5903 Drug induced constipation: Secondary | ICD-10-CM

## 2014-05-11 DIAGNOSIS — I7100 Dissection of unspecified site of aorta: Secondary | ICD-10-CM

## 2014-05-11 DIAGNOSIS — N183 Chronic kidney disease, stage 3 unspecified: Secondary | ICD-10-CM

## 2014-05-11 DIAGNOSIS — I422 Other hypertrophic cardiomyopathy: Secondary | ICD-10-CM

## 2014-05-11 DIAGNOSIS — K5909 Other constipation: Secondary | ICD-10-CM

## 2014-05-11 DIAGNOSIS — I5032 Chronic diastolic (congestive) heart failure: Secondary | ICD-10-CM

## 2014-05-11 DIAGNOSIS — K7689 Other specified diseases of liver: Secondary | ICD-10-CM

## 2014-05-11 DIAGNOSIS — E669 Obesity, unspecified: Secondary | ICD-10-CM

## 2014-05-11 MED ORDER — POTASSIUM CHLORIDE ER 20 MEQ PO TBCR: 20.0000 meq | EXTENDED_RELEASE_TABLET | Freq: Every day | ORAL | Status: DC

## 2014-05-11 MED ORDER — ISOSORBIDE MONONITRATE ER 30 MG PO TB24: 30.0000 mg | ORAL_TABLET | Freq: Every day | ORAL | Status: DC

## 2014-05-11 NOTE — Patient Instructions (Signed)
We will draw labs today:  BMET  You are being referred to Dr. Marva Panda at [facility] They will review your information and contact you with an appointment  Please start Imdur 30 mg once daily Please take Miralax 2-3 times per day  Please increase your torsemide to 80 mg twice daily for 2 days, then 60 mg twice daily Please increase your potassium to 20 meq twice daily  Your physician recommends that you schedule a follow-up appointment in: 2-3 weeks
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
## 2014-05-12 ENCOUNTER — Other Ambulatory Visit: Payer: Self-pay

## 2014-05-12 DIAGNOSIS — I5032 Chronic diastolic (congestive) heart failure: Secondary | ICD-10-CM | POA: Insufficient documentation

## 2014-05-12 LAB — BASIC METABOLIC PANEL
BUN/Creatinine Ratio: 28 — ABNORMAL HIGH (ref 9–23)
BUN: 44 mg/dL — ABNORMAL HIGH (ref 6–24)
CALCIUM: 8.8 mg/dL (ref 8.7–10.2)
CO2: 30 mmol/L — ABNORMAL HIGH (ref 18–29)
CREATININE: 1.58 mg/dL — AB (ref 0.57–1.00)
Chloride: 90 mmol/L — ABNORMAL LOW (ref 97–108)
GFR calc Af Amer: 42 mL/min/{1.73_m2} — ABNORMAL LOW (ref >59)
GFR, EST NON AFRICAN AMERICAN: 37 mL/min/{1.73_m2} — AB (ref >59)
Glucose: 253 mg/dL — ABNORMAL HIGH (ref 65–99)
Potassium: 4.3 mmol/L (ref 3.5–5.2)
SODIUM: 140 mmol/L (ref 134–144)

## 2014-05-12 MED ORDER — DOXAZOSIN MESYLATE 4 MG PO TABS: 4.0000 mg | ORAL_TABLET | Freq: Every day | ORAL | Status: DC

## 2014-05-15 ENCOUNTER — Ambulatory Visit: Payer: Self-pay | Admitting: Family

## 2014-05-18 ENCOUNTER — Other Ambulatory Visit: Payer: Self-pay

## 2014-05-18 ENCOUNTER — Encounter: Payer: Self-pay | Admitting: *Deleted

## 2014-05-18 ENCOUNTER — Other Ambulatory Visit: Payer: No Typology Code available for payment source

## 2014-05-18 DIAGNOSIS — R609 Edema, unspecified: Secondary | ICD-10-CM

## 2014-06-01 ENCOUNTER — Ambulatory Visit (INDEPENDENT_AMBULATORY_CARE_PROVIDER_SITE_OTHER): Payer: No Typology Code available for payment source | Admitting: Cardiovascular Disease

## 2014-06-01 ENCOUNTER — Encounter: Payer: Self-pay | Admitting: Cardiovascular Disease

## 2014-06-01 VITALS — BP 140/97 | HR 86 | Ht 64.0 in | Wt 171.0 lb

## 2014-06-01 DIAGNOSIS — K5900 Constipation, unspecified: Secondary | ICD-10-CM

## 2014-06-01 DIAGNOSIS — I5042 Chronic combined systolic (congestive) and diastolic (congestive) heart failure: Secondary | ICD-10-CM

## 2014-06-01 DIAGNOSIS — R0602 Shortness of breath: Secondary | ICD-10-CM

## 2014-06-01 DIAGNOSIS — N183 Chronic kidney disease, stage 3 unspecified: Secondary | ICD-10-CM

## 2014-06-01 DIAGNOSIS — I10 Essential (primary) hypertension: Secondary | ICD-10-CM

## 2014-06-01 DIAGNOSIS — I2510 Atherosclerotic heart disease of native coronary artery without angina pectoris: Secondary | ICD-10-CM

## 2014-06-01 DIAGNOSIS — I5032 Chronic diastolic (congestive) heart failure: Secondary | ICD-10-CM

## 2014-06-01 DIAGNOSIS — E118 Type 2 diabetes mellitus with unspecified complications: Secondary | ICD-10-CM

## 2014-06-01 MED ORDER — DOXAZOSIN MESYLATE 4 MG PO TABS: 4.0000 mg | ORAL_TABLET | Freq: Two times a day (BID) | ORAL | Status: DC

## 2014-06-01 NOTE — Assessment & Plan Note (Signed)
Blood pressure better but still elevated. We have recommended that she increase Cardura to 4 mg twice a day

## 2014-06-01 NOTE — Patient Instructions (Addendum)
You are doing well. Please increase the cardura up to 4 mg twice a day for blood pressure  We will make a referral to GI in [place] (Dr. Servando Snare) You are scheduled to see Chyrl Civatte, NP w/ [facility] (Dr. Annabell Sabal office) On Tuesday, March 15 @ 3:30 They are located on the 2nd flood of the [employer] [address], [address] [phone]  [facility] to skip a few torsemide doses in the afternoon (maybe Monday, Wednesday, Friday)  Please call us if you have new issues that need to be addressed before your next appt.  Your physician wants you to follow-up in: 6 months.  You will receive a reminder letter in the mail two months in advance. If you don't receive a letter, please call our office to schedule the follow-up appointment.

## 2014-06-01 NOTE — Assessment & Plan Note (Signed)
We have encouraged continued exercise, careful diet management in an effort to lose weight. 

## 2014-06-01 NOTE — Assessment & Plan Note (Signed)
Appears relatively euvolemic on today's visit. Takes torsemide 60 mg twice a day Recommended extra torsemide only for worsening leg swelling. Recent lab work showed worsening renal function suggestive of mild dehydration she was given extra afternoon torsemide here and there.  She will try to arrange routine lab work with primary care later today

## 2014-06-01 NOTE — Assessment & Plan Note (Addendum)
Chronic underlying shortness of breath requiring oxygen, secondary to COPD, continued smoking

## 2014-06-01 NOTE — Assessment & Plan Note (Signed)
Recent worsening of her renal function, likely from over diuresis with high-dose torsemide. Suggested that she miss several afternoon doses 3 days per week to avoid over diuresis

## 2014-06-01 NOTE — Progress Notes (Signed)
Patient ID: Lisa Leblanc, female    DOB: 1960-09-03, 54 y.o.   MRN: [id number]  HPI Comments: Lisa Leblanc is a 54 year old woman, patient of Dr. Charlette Caffey, long history of smoking, COPD, malignant hypertension, on chronic opioid management for severe back pain, type 2 diabetes,   hospitalization 05/29/2013 with discharge 05/30/2013. She was admitted with new onset acute systolic heart failure, uncontrolled hypertension,  elevated troponin from demand ischemia. Cardiac arrest during back surgery April 2015 requiring CPR, surgery aborted, extubated 07/01/2013. Severe hypertension at the time.  She presents for follow-up of her hypertension  In follow-up today, she reports that she feels well. Reports having no significant lower extremity edema though does have some leg tenderness when her legs are down, better when her legs are up. Stable chronic shortness of breath, continues to smoke. Blood pressure continues to run mildly elevated but better overall. She was unable to tolerate isosorbide. Changed to Cardura and has had no side effects  EKG on today's visit shows normal sinus rhythm with rate 86 bpm, left axis deviation  Other past medical history In the hospital at [facility] admitted 01/31/2014, discharge 02/08/2014 with a descending thoracic aorta dissection. She presented with acute back pain, dissection confirmed on MRI extending from beyond the takeoff of the left subclavian distally. Renal arteries were not compromised  In the hospital admission in March 2015, she presented with respiratory distress, placed on BiPAP, had elevated BNP, chest x-ray consistent with heart failure. She was given aggressive diuresis with several liters of fluid removed. Creatinine climbed and losartan was held. She reports having prior stress test and was told that this was normal. In 2014  Echocardiogram 05/30/2013 shows ejection fraction 30-35%, moderate LVH, normal right ventricular systolic  pressures Stress test in June 2014, pharmacologic Myoview, showing no significant ischemia, ejection fraction 51%   Outpatient Encounter Prescriptions as of 06/01/2014  Medication Sig  . albuterol (PROVENTIL HFA;VENTOLIN HFA) 108 (90 BASE) MCG/ACT inhaler Inhale 2 puffs into the lungs every 6 (six) hours as needed for wheezing or shortness of breath.  . cloNIDine (CATAPRES - DOSED IN MG/24 HR) 0.3 mg/24hr patch Place 1 patch (0.3 mg total) onto the skin once a week.  . cyclobenzaprine (FLEXERIL) 10 MG tablet Take 10 mg by mouth daily as needed for muscle spasms.   Marland Kitchen doxazosin (CARDURA) 4 MG tablet Take 1 tablet (4 mg total) by mouth 2 (two) times daily.  . fluconazole (DIFLUCAN) 150 MG tablet Take 150 mg by mouth as needed (thrush).   . hydrALAZINE (APRESOLINE) 100 MG tablet Take 1 tablet (100 mg total) by mouth 3 (three) times daily. (Patient taking differently: Take 100 mg by mouth 3 (three) times daily as needed. )  . labetalol (NORMODYNE) 300 MG tablet Take 300 mg by mouth 3 (three) times daily.  . Linaclotide (LINZESS) 145 MCG CAPS capsule Take 145 mcg by mouth daily.  . montelukast (SINGULAIR) 10 MG tablet Take 10 mg by mouth at bedtime.  . Multiple Vitamin (MULTIVITAMIN WITH MINERALS) TABS tablet Take 1 tablet by mouth daily.  . nitroGLYCERIN (NITROSTAT) 0.4 MG SL tablet Place 1 tablet (0.4 mg total) under the tongue every 5 (five) minutes as needed for chest pain.  Marland Kitchen ondansetron (ZOFRAN) 8 MG tablet Take 8 mg by mouth every 8 (eight) hours as needed for nausea or vomiting.  . Oxycodone HCl 20 MG TABS Take 20 mg by mouth as needed.   Marland Kitchen oxymorphone (OPANA ER) 20 MG 12 hr tablet Take 40  mg by mouth every 12 (twelve) hours.   . potassium chloride 20 MEQ TBCR Take 20 mEq by mouth daily.  . ranitidine (ZANTAC) 75 MG tablet Take 150 mg by mouth daily.  Marland Kitchen torsemide (DEMADEX) 20 MG tablet Take 60 mg by mouth 2 (two) times daily.  . [DISCONTINUED] doxazosin (CARDURA) 4 MG tablet Take 1 tablet (4  mg total) by mouth daily.  . [DISCONTINUED] OXYCONTIN 40 MG T12A 12 hr tablet Take 20 mg by mouth every 12 (twelve) hours.       Social history  reports that she has been smoking Cigarettes.  She has a 15 pack-year smoking history. She has never used smokeless tobacco. She reports that she does not drink alcohol or use illicit drugs.   Past medical history   has a past medical history of Obesity; Hypertension; Lumbosacral neuritis; Chronic pain; Narcotic dependence; Cataract; Diabetes mellitus; Pure hypercholesterolemia; Syncope and collapse; Chronic systolic CHF (congestive heart failure), NYHA class 2; Cardiac arrest; NICM (nonischemic cardiomyopathy); Aortic dissection, thoracic; Hypertensive hypertrophic cardiomyopathy; CKD (chronic kidney disease), stage III; and Liver lesion, right lobe.  Review of Systems  Respiratory: Positive for shortness of breath.   Cardiovascular: Negative.   Gastrointestinal: Negative.   Musculoskeletal: Positive for back pain.  Neurological: Negative.   Hematological: Negative.   Psychiatric/Behavioral: Negative.   All other systems reviewed and are negative.   BP 140/97 mmHg  Pulse 86  Ht 5\' 4"  (1.626 m)  Wt 171 lb (77.565 kg)  BMI 29.34 kg/m2  Physical Exam  Constitutional: She is oriented to person, place, and time. She appears well-developed and well-nourished.  HENT:  Head: Normocephalic.  Nose: Nose normal.  Mouth/Throat: Oropharynx is clear and moist.  Eyes: Conjunctivae are normal. Pupils are equal, round, and reactive to light.  Neck: Normal range of motion. Neck supple. No JVD present.  Cardiovascular: Normal rate, regular rhythm, S1 normal, S2 normal, normal heart sounds and intact distal pulses.  Exam reveals no gallop and no friction rub.   No murmur heard. Pulmonary/Chest: Effort normal and breath sounds normal. No respiratory distress. She has no wheezes. She has no rales. She exhibits no tenderness.  Abdominal: Soft. Bowel sounds  are normal. She exhibits no distension. There is no tenderness.  Musculoskeletal: Normal range of motion. She exhibits no edema or tenderness.  Lymphadenopathy:    She has no cervical adenopathy.  Neurological: She is alert and oriented to person, place, and time. Coordination normal.  Skin: Skin is warm and dry. No rash noted. No erythema.  Psychiatric: She has a normal mood and affect. Her behavior is normal. Judgment and thought content normal.  Appears depressed  Assessment and Plan  Nursing note and vitals reviewed.

## 2014-06-06 ENCOUNTER — Ambulatory Visit: Payer: Self-pay | Admitting: Urgent Care

## 2014-06-08 ENCOUNTER — Telehealth: Payer: Self-pay

## 2014-06-08 NOTE — Telephone Encounter (Signed)
Per Dr. Mariah Milling, pt needs f/u appt to see him in 2-3 weeks.  Attempted to contact pt, but her voicemail box has not been set up, so unable to leave message.

## 2014-06-08 NOTE — Telephone Encounter (Signed)
Patient returned Mandi's call.  Patient scheduled for fu in 2 weeks on 06-22-14 at 09:45 am.  Patient complains of continued swelling.  Patient was advised that this would be passed on to [facility] and patient was instructed to call anytime with questions, concerns or if she felt like she needed to be seen sooner than already scheduled.

## 2014-06-12 NOTE — Telephone Encounter (Signed)
Would continue torsemide 60 BID Extra torsemide only as needed for edema  She was going to have labs through primary care?

## 2014-06-13 ENCOUNTER — Ambulatory Visit: Payer: Self-pay | Admitting: Family

## 2014-06-13 NOTE — Telephone Encounter (Signed)
Spoke w/ pt.  She reports that she had her labs drawn and "did everything I was told to do". Reports BP today of 171/115, wt gain and SOB. She is on her way to appt w/ Clarisa Kindred, FNP at the HF clinic.  Today is her 2nd appt.  Advised her to proceed to her appt w/ Inetta Fermo and have her call us w/ any questions or concerns.

## 2014-06-22 ENCOUNTER — Ambulatory Visit: Payer: No Typology Code available for payment source | Admitting: Cardiovascular Disease

## 2014-06-22 ENCOUNTER — Encounter: Payer: Self-pay | Admitting: *Deleted

## 2014-06-23 ENCOUNTER — Other Ambulatory Visit: Payer: Self-pay | Admitting: Cardiovascular Disease

## 2014-06-26 NOTE — Telephone Encounter (Signed)
Ok to refill Furosemide? Pt mentioned that she is taking furosemide but I do not see it on medication list. Please advise if ok.

## 2014-06-28 ENCOUNTER — Telehealth: Payer: Self-pay

## 2014-06-28 NOTE — Telephone Encounter (Signed)
Received blood thinner info request from from [facility] for pt to proceed w/ colonoscopy that has not been scheduled yet.  Per Eula Listen, PA, "pt was a no-show for her heart failure appt on 06/20/14.  Given her elevated BP & documented LE edema would recommend ov & improved BP prior to procedure". Faxed to [phone].

## 2014-06-28 NOTE — Telephone Encounter (Signed)
Pt requesting Rx for Furosemide which is not on medication list. Pt was told to take torsemide in previous phone note. Just want to verify if ok to refill being that pt is on alt. Diuretic already. She mentioned that she takes torsemide as well. Please advise.

## 2014-06-29 NOTE — Telephone Encounter (Signed)
Please review note from [place]. Thanks! Please advise.   Patient needs a refill on labetalol (NORMODYNE) 300 MG tablet [Pharmacy Med Name: LABETALOL HCL 300 MG TABLET] one tablet three times a day.   furosemide (LASIX) 40 MG tablet [Pharmacy Med Name: FUROSEMIDE 40 MG TABLET] take 1 tablet by mouth twice a day, Disp-60 tablet, R-6, Normal

## 2014-07-04 NOTE — Telephone Encounter (Signed)
Please review for Lasix refill. Thanks!

## 2014-07-04 NOTE — Telephone Encounter (Signed)
Refilled the labetalol but not the furosemide since the patient is taking the Torsemide 20 mg one tablet twice a day. The patient saw Clarisa Kindred, NP for CHF follow up, she was only taking the Torsemide 20 mg twice a day.

## 2014-07-15 NOTE — Consult Note (Signed)
General Aspect Lisa Leblanc is a 54yo African American female w/ PMHx s/f chronic systolic CHF (EF 24-40%), uncontrolled HTN, DM2, hypertension and long smoking history admitted to [facility] today after intra-operative cardiac arrest. Cardiology consulted for same.  She underwent scheduled T11-T12 kyphoplasty today. She subsequently developed bradycardia and asystole (per notes). She underwent ~20 seconds of CPR, epi x 1, atropine x 1, then w/ ROSC. Surgery was aborted. She was sedated, intubated and transferred to CCU.   She last followed by w/ Dr. Rockey Situ 05/2013. She had recently been diagnosed with new onset systolic CHF- EF 10-27%. She did have a mild troponin elevation felt to be demand ischemia from CHF. We were not consulted. She was discharged with follow-up with Dr. Rockey Situ. Prior [employer] 08/2012 revealed EF 51%, no ischemia or WMAs. Question NICM. She was noted to be markedly hypertensive at home and in the office- 160/123. Hydralazine increased up to 50m TID, labetalol 3086mTID, Lasix 4075mID PRN and KDur 44m28mID PRN were added.   She was evaluated by ortho as an outpatient, and scheduled to undergo kyphoplasty today. She was placed in prone position, intubated, inducted with ketamine and propafol and coded shortly after as above.   Present Illness She is currently sedated and intubated in the CCU. BP independently supported (160/100). Labwork is remarkable for a mild hypokalemia (3.1), troponin elevation (0.09). CBC- 15.8K WBC, Hgb 10.7/Hct 32.9. ABG- pH 7.30, pCO2 51, pO2 270.   Never had any complications to prior anesthesia per old notes.   CXR- no acute abnormalities. KUB was obtained for abdominal distention. This revealed significant stool in R colon, no evidence of obstruction. Prior R pelvic ring fracture.  Telemetry currently reveals NSR, 80 bpm.  PAST MEDICAL HISTORY: 1. History of chronic low back pain.  2. Systolic congestive heart failure with ejection fraction 35%.   3. Hypertension.  4. Diabetes.  5. Tobacco abuse.   PAST SURGICAL HISTORY: Cholecystectomy, left knee ACL ligament repair.   SOCIAL HISTORY: The patient lives at home with her husband. Per records, smokes 1 pack a day. No alcohol use. No illicit drug use.   FAMILY HISTORY: Dad had Parkinson disease. Mother has diabetes.   Physical Exam:  GEN obese, intubated, sedated   NECK supple  No masses  no appreciable JVD   RESP ventilator-dependent respirations, no wheezing, rales or rhonchi   CARD Regular rate and rhythm  Normal, S1, S2  No murmur   ABD denies tenderness  soft  hypoactive BS   EXTR negative cyanosis/clubbing, negative edema   SKIN normal to palpation   PSYCH sedated   Review of Systems:  ROS Pt not able to provide ROS   Home Medications: Medication Instructions Status  labetalol 100 mg oral tablet 1 tab(s) orally once a day (in the morning) Active  hydrALAZINE 25 mg oral tablet 1 tab(s) orally 4 times a day Active  OxyCONTIN 60 mg oral tablet, extended release 1 tab(s) orally every 12 hours Active  Vitamin D3 1000 intl units oral tablet 1 tab(s) orally once a day Active   Lab Results:  Hepatic:  09-Apr-15 14:20   Bilirubin, Total 0.4  Alkaline Phosphatase  139 (45-117 NOTE: New Reference Range 02/11/13)  SGPT (ALT) 31  SGOT (AST)  45  Total Protein, Serum 6.6  Albumin, Serum  3.2  Lab:  09-Apr-15 12:28   Lactic Acid, Cardiopulmonary 1.1 (Result(s) reported on 30 Jun 2013 at 12:37PM.)  pH (ABG)  7.30  PCO2  51  PO2  270  FiO2 100  Base Excess -2.0  HCO3 25.1  O2 Saturation 99.3  O2 Device 840  Specimen Site (ABG) RT RADIAL  Specimen Type (ABG) ARTERIAL  Mode ASSIST CONTROL  Vt 500  PEEP 5.0  %FiO2 100.0  Mechanical Rate 10 (Result(s) reported on 30 Jun 2013 at 12:48PM.)  Routine Chem:  09-Apr-15 14:20   Result Comment Troponin - RESULTS VERIFIED BY REPEAT TESTING.  - called to [facility] @ [phone]  - 06/30/13-tms  - [place].  Result(s) reported on 30 Jun 2013 at 03:16PM.  Glucose, Serum  111  BUN 15  Creatinine (comp) 1.26  Sodium, Serum 138  Potassium, Serum  3.1  Chloride, Serum 105  CO2, Serum 29  Calcium (Total), Serum  8.3  Osmolality (calc) 277  eGFR (African American)  56  eGFR (Non-African American)  48 (eGFR values <28m/min/1.73 m2 may be an indication of chronic kidney disease (CKD). Calculated eGFR is useful in patients with stable renal function. The eGFR calculation will not be reliable in acutely ill patients when serum creatinine is changing rapidly. It is not useful in  patients on dialysis. The eGFR calculation may not be applicable to patients at the low and high extremes of body sizes, pregnant women, and vegetarians.)  Anion Gap  4  Magnesium, Serum 1.8 (1.8-2.4 THERAPEUTIC RANGE: 4-7 mg/dL TOXIC: > 10 mg/dL  -----------------------)  Cardiac:  09-Apr-15 14:20   Troponin I  0.09 (0.00-0.05 0.05 ng/mL or less: NEGATIVE  Repeat testing in 3-6 hrs  if clinically indicated. >0.05 ng/mL: POTENTIAL  MYOCARDIAL INJURY. Repeat  testing in 3-6 hrs if  clinically indicated. NOTE: An increase or decrease  of 30% or more on serial  testing suggests a  clinically important change)  Routine Coag:  09-Apr-15 14:20   Prothrombin 13.5  INR 1.0 (INR reference interval applies to patients on anticoagulant therapy. A single INR therapeutic range for coumarins is not optimal for all indications; however, the suggested range for most indications is 2.0 - 3.0. Exceptions to the INR Reference Range may include: Prosthetic heart valves, acute myocardial infarction, prevention of myocardial infarction, and combinations of aspirin and anticoagulant. The need for a higher or lower target INR must be assessed individually. Reference: The Pharmacology and Management of the Vitamin K  antagonists: the seventh ACCP Conference on Antithrombotic and Thrombolytic Therapy. CJHERD.4081 Sept:126 (3suppl): [id number] A HCT value >55% may artifactually increase the PT.  In one study,  the increase was an average of 25%. Reference:  "Effect on Routine and Special Coagulation Testing Values of Citrate Anticoagulant Adjustment in Patients with High HCT Values." American Journal of Clinical Pathology 2006;126:400-405.)  Activated PTT (APTT) 24.7 (A HCT value >55% may artifactually increase the APTT. In one study, the increase was an average of 19%. Reference: "Effect on Routine and Special Coagulation Testing Values of Citrate Anticoagulant Adjustment in Patients with High HCT Values." American Journal of Clinical Pathology 2006;126:400-405.)  Routine Hem:  09-Apr-15 14:20   WBC (CBC)  15.8  RBC (CBC)  3.79  Hemoglobin (CBC)  10.7  Hematocrit (CBC)  32.9  Platelet Count (CBC) 338  MCV 87  MCH 28.2  MCHC 32.6  RDW  16.7  Neutrophil % 95.4  Lymphocyte % 2.7  Monocyte % 1.6  Eosinophil % 0.0  Basophil % 0.3  Neutrophil #  15.0  Lymphocyte #  0.4  Monocyte # 0.3  Eosinophil # 0.0  Basophil # 0.0 (Result(s) reported on 30 Jun 2013 at 02:38PM.)   EKG:  Interpretation NSR, RBBB (old), LAD, inferior IVCD, no ST/T changes   Rate 87   EKG Comparision Not changed from  05/2013 office tracing   Radiology Results: XRay:    09-Apr-15 13:11, Chest Portable Single View  Chest Portable Single View   REASON FOR EXAM:    intubated  COMMENTS:       PROCEDURE: DXR - DXR PORTABLE CHEST SINGLE VIEW  - Jun 30 2013  1:11PM     CLINICAL DATA:  54 year old female intubated. Initial encounter.    EXAM:  PORTABLE CHEST - 1 VIEW    COMPARISON:  05/29/2013 and earlier.    FINDINGS:  Portable AP supine view at 1249 hrs. Endotracheal tube tip projects  over the tracheal air column at the level of clavicles. Stable lung  volumes. Cardiomegaly. Interval decreased widespread somewhat  nodular interstitial opacity. No pneumothorax or pleural effusion.  No consolidation. Augmented  lower thoracic compression fracture.     IMPRESSION:  1. Intubated, endotracheal tube tip at the level the clavicles.  2. Improved ventilation compared to 05/29/2013. No definite acute  cardiopulmonary abnormality.      Electronically Signed    By: Lars Pinks M.D.    On: 06/30/2013 13:15       Verified By: Gwenyth Bender. [place] Crane, M.D.,    09-Apr-15 13:35, KUB - Kidney Ureter Bladder  KUB - Kidney Ureter Bladder   REASON FOR EXAM:    abd distension  COMMENTS:       PROCEDURE: DXR - DXR KIDNEY URETER BLADDER  - Jun 30 2013  1:35PM     CLINICAL DATA:  Abdominal distention    EXAM:  ABDOMEN - 1 VIEW    COMPARISON:  None.    FINDINGS:  There is moderate stool throughout the colon, particularly in the  right colon. The bowel gas pattern is overall unremarkable. No  obstruction or free air is seen on this supine examination. There is  a calcification on the right which may reside within the colon. Its  etiology is uncertain. There is evidence of a prior fracture of the  right lateral superior pubic ramus as well as a prior fracture of  the right medial ischium.     IMPRESSION:  Bowel gas pattern overall unremarkable. Fairly extensive stool  throughout right colon. Previous fractures in the right pelvic ring.      Electronically Signed    By: Lowella Grip M.D.    On: 06/30/2013 13:36     Verified By: Leafy Kindle. WOODRUFF, M.D.,    Contrast - Iodinated Radiocontrast Dye: SOB, Hives, Rash  Iodine: Hives  Vital Signs/Nurse's Notes: **Vital Signs.:   09-Apr-15 16:00  Pulse Pulse 92  Respirations Respirations 21  Systolic BP Systolic BP 536  Diastolic BP (mmHg) Diastolic BP (mmHg) 468  Mean BP 124  Pulse Ox % Pulse Ox % 99  Pulse Ox Activity Level  At rest  Oxygen Delivery Ventilator Assisted  Pulse Ox Heart Rate 92    Impression 54yo African American female w/ PMHx s/f chronic systolic CHF (EF 03-21%), uncontrolled HTN, DM2 hyperlipidemia and long smoking history  admitted to [facility] today after intra-operative cardiac arrest. Cardiology consulted for same.  1. Cardiac arrest, asystole- prone during the surgery.  Severe HTN prior to stfart of case, 224 systolic, 825O diastolic, heart rate >037  Seems to be temporally correlated to anesthesia induction which may have contributed. She was tachycardic before case, rate  100, labetolol given by anesthesia at start of case. SBP >180 throughtout the case, esmolol given  CPR x 20 sec. She received a dose of atropine and epi, then returned ROSC. No evidence of electrolyte derangements. No evidence of bradycardia, block, pauses or arrhythmias on telemetry. Initial TnI did return elevated at 0.09-- may be due to CPR or coronary underperfusion w/ circulatory collapse.  --Repeat echo EF 25 to 30% -- Continue cycling cardiac biomarkers to trend -- Monitor BP, rhythm on telemetry.  Will need to restart antihypertenives with weaning sedation, will likely need further management before discharge  2. Chronic systolic CHF- suspect nonischemic cardiomyopathy. Normal Myoview 08/2012. EF 30-35% on echo last month. Appears well-compensated at present. No evidence of volume overload on exam. CXR w/o mention of pulmonary edema. Would anticipate VT/VF in a patient with reduced EF over bradycardia/asystole event. -- Limit IVFs -- Once recovered, optimize medical therapy for HFrEF -- Strict I/Os, daily weights- Lasix PRN for weight increase, up-trending I/Os or evidence of volume overload on exam  3. Uncontrolled HTN -- Hold antihypertensives for now s/p cardiac arrest -- Continue to monitor- could consider hydralazine IV PRN for SBP >160 once stable  4. Hyperlipidemia -- Check lipid panel   Plan 5. DM2 -- Glycemic control per primary team  6. Leukocytosis- evidenced on CBC. No evidence of consolidation on CXR. Question stress-mediated. -- Follow CBC -- Check u/a  7. Normocytic anemia- improved from baseline. -- Follow CBC    Electronic Signatures: Hannia Matchett A (PA-C)  (Signed 09-Apr-15 17:00)  Authored: General Aspect/Present Illness, History and Physical Exam, Review of System, Home Medications, Labs, EKG , Radiology, Allergies, Vital Signs/Nurse's Notes, Impression/Plan Ida Rogue (MD)  (Signed 09-Apr-15 20:04)  Authored: General Aspect/Present Illness, Impression/Plan  Co-Signer: General Aspect/Present Illness, History and Physical Exam, Review of System, Home Medications, Labs, EKG , Radiology, Allergies, Vital Signs/Nurse's Notes, Impression/Plan   Last Updated: 09-Apr-15 20:04 by Ida Rogue (MD)

## 2014-07-15 NOTE — Op Note (Signed)
PATIENT NAME:  Lisa Leblanc, Lisa Leblanc MR#:  [id number] DATE OF BIRTH:  1960-09-03  DATE OF PROCEDURE:  11/24/2013  PREOPERATIVE DIAGNOSIS: Compression fractures: T9, T11, T12.  POSTOPERATIVE DIAGNOSIS: Compression fractures: T9, T11, T12.    PROCEDURE: T9, T11, T12 kyphoplasty.   ANESTHESIA: MAC.   SURGEON: Laurene Footman, MD  DESCRIPTION OF PROCEDURE: The patient was brought to the operating room and, after adequate anesthesia was obtained, the patient was positioned prone  with good visualization on both AP and lateral projections.  Appropriate patient identification and timeout procedure was carried out. One percent (1%) Xylocaine was infiltrated subcutaneously in the area of the planned incisions. Next, the back was again prepped and draped in a sterile fashion. Repeat timeout procedure was carried out.   A spinal needle was used to get down to the pedicle at each of the levels. A small stab incision was made and T9 was then entered first using an Express Kit for a smaller pedicle from the left side. This came in through an extrapedicular approach and biopsy was obtained. Drilling was carried out across the midline and the balloon inserted and inflated to approximately 2 mL. This was left in place as we went down to T11, were T10 had previously had kyphoplasty. Going on the right side at T11, which had been done previously, but the procedure had been discontinued because of patient coding, a trocar was entered in, again in an extrapedicular lateral approach, and the vertebral body entered. No biopsy was obtained since that had been  done previously. Balloon was used was used to inflate this level and the balloon came back posteriorly towards the posterior wall, and so inflation was stopped to keep this a safe procedure rather than risks extravasation of cement into the spinal canal. This was filled with approximately 2 mL prior to stopping. The left side was approached for T12, and inflation was  carried out to about 2.5 mL, with good opening centrally. Cement was mixed and the vertebral bodies were sequentially filled, first with about 2 mL at T9, getting good fill, and it appeared to fill into the fracture clefts. Going to T11, there was good fill centrally as was the case at T12.   After the cement had set and trocars were removed and permanent C-arm views were obtained showing good fill and no extravasation, pressure was applied to the small incisions and they were then covered with Dermabond to close, followed by Band-Aids. The patient was then sent to the recovery room in stable condition.   ESTIMATED BLOOD LOSS: Approximately 25 mL.   COMPLICATIONS: None.   SPECIMEN: T9 vertebral body biopsy.   CONDITION: To recovery room stable.    ____________________________ Laurene Footman, MD mjm:MT D: 11/24/2013 19:52:43 ET T: 11/25/2013 07:04:54 ET JOB#: [id number]  cc: Laurene Footman, MD, <Dictator> Laurene Footman MD ELECTRONICALLY SIGNED 11/25/2013 8:09

## 2014-07-15 NOTE — Discharge Summary (Signed)
PATIENT NAME:  Lisa Leblanc, Lisa Leblanc MR#:  [id number] DATE OF BIRTH:  [birth year]  DATE OF ADMISSION:  05/29/2013 DATE OF DISCHARGE:  05/30/2013  ADMISSION DIAGNOSIS:  New-onset acute congestive heart failure.   DISCHARGE DIAGNOSES: 1.  New-onset acute systolic heart failure.  2.  Uncontrolled hypertension.  3.  Diet-controlled diabetes.  4.  Elevated troponin from demand ischemia.    LABORATORY, DIAGNOSTIC AND RADIOLOGICAL DATA:   1.  A 2-D echocardiogram showed EF of 30% to 35% with moderately decreased global left ventricular systolic function, impaired relaxation of the LV diastolic dysfunction.  2.  White blood cell count 9.8, hemoglobin 11, hematocrit 33, platelets 382.  3.  Sodium 137, potassium 3.4, chloride 101, bicarb 32, BUN 19, creatinine 1.57, glucose is 144, magnesium 1.9.  4.  Troponin is max of 0.10.   HOSPITAL COURSE:  A 54 year old female who presented with respiratory distress, placed on a BiPAP, found to have elevated BNP and pulmonary edema and chest x-ray consistent with acute congestive heart failure. For further details, please refer to history and physical.   1.  Acute respiratory failure secondary to pulmonary edema, new-onset congestive heart failure. The patient had an echocardiogram which does show a low ejection fraction of 30% to 35%. Her CHF if systolic in nature, which is acute onset. She has almost 3 liters negative with Lasix 40 IV b.i.d. We stopped the Lasix. Her creatinine went up just a little bit so we are holding her losartan and this is planned to start in 2 to 3 days. She will also start Lasix at that time as well.  I spoke with Dr. Mariah Milling, he read the echocardiogram. The patient will follow up with Dr. Mariah Milling as an outpatient. She has seen Dr. Juliann Pares in the past with a stress test in the past but prefers to see a new cardiologist at this time so will refer her to Dr. Mariah Milling.  2.  Elevated troponins due to demand ischemia from CHF, not acute coronary  syndrome.  3.  Uncontrolled hypertension. The patient had issues with this in the past and has been worked up. Her goal systolic blood pressure is less than 130 and will refer her to a cardiologist.  4.  Hypokalemia which was repleted.  5.  Chronic back pain and recent kyphoplasty. The patient is on pain medications.  6.  Diet-controlled diabetes. The patient will be on an ADA diet.   DISCHARGE MEDICATIONS: 1.  OxyContin 60 mg q.12 hours.   2.  Hydralazine 25 mg 4 times a day.  3.  Losartan 50 mg daily, to start on 06/02/2013.  4.  Aspirin 81 mg daily.  5.  Labetalol 100 mg b.i.d.   6.  Nicotine patch 21 mg per 24 hours.  7.  Lasix 40 mg daily, to start on 06/03/2013.   DISCHARGE DIET: Low sodium, ADA diet, 1800 mL fluid restriction.   DISCHARGE ACTIVITY: As tolerated.   DISCHARGE FOLLOWUP:  In 1 week with Dr. Thana Ates, in 1 week with Dr. Mariah Milling.   TIME SPENT: 35 minutes. The patient is medically stable for discharge.    ____________________________ Viktoria Gruetzmacher P. Juliene Pina, MD spm:cs D: 05/30/2013 13:52:40 ET T: 05/30/2013 14:12:56 ET JOB#: [id number]  cc: Lisa Jared P. Juliene Pina, MD, <Dictator> Dennison Mascot, MD Antonieta Iba, MD Janyth Contes Madex Seals MD ELECTRONICALLY SIGNED 05/31/2013 8:36

## 2014-07-15 NOTE — H&P (Signed)
PATIENT NAME:  Lisa Leblanc, Lisa Leblanc MR#:  [id number] DATE OF BIRTH:  [birth year]  DATE OF ADMISSION:  06/30/2013  PRIMARY CARE PROVIDER: Dr. Thana Ates.   REFERRING PHYSICIAN: Dr. Rosita Kea.  CHIEF COMPLAINT: Bradycardia, asystole and cardiac arrest.   HISTORY OF PRESENT ILLNESS: A 54 year old [place] American female patient with recently diagnosed new onset cardiomyopathy with ejection fraction of 35%., Systolic congestive heart failure, uncontrolled hypertension, diabetes, who presented to the hospital at the outpatient surgery center for kyphoplasty of T11-T12 vertebral fractures. The patient was in a prone position, was given ketamine and propofol for induction, was intubated, and just prior to starting her procedure the patient had an episode of bradycardia and then asystole, Had CPR done in the prone position briefly, then flipped over to supine position, then had blood pressure, heart rate come back and her CPR was stopped. In the meanwhile patient did receive a dose of atropine and epinephrine. Her blood pressure was elevated in the 190s initially after the epinephrine. The patient has been brought in CCU in intubated state. I have discussed case with anesthesia, nursing staff and OR,  along with Dr. Belia Heman of pulmonary.   Presently, the patient is intubated, but is waking up, moving all her extremities.   MEDICATIONS: On reviewing her home medication list. The patient is on oxycodone and labetalol at home.   ALLERGIES: THE ONLY ALLERGIES I SEE ARE CONTRAST AND IODINE.   Never had any complications to prior anesthesia per old notes.   PAST MEDICAL HISTORY: 1. History of chronic low back pain.  2. Systolic congestive heart failure with ejection fraction 35%.  3. Hypertension.  4. Diabetes.  5. Tobacco abuse.   PAST SURGICAL HISTORY: Cholecystectomy, left knee ACL ligament repair.   HOME MEDICATIONS:  1. Labetalol 100 mg oral daily.  2. OxyContin 60 mg oral twice daily.  3. Hydralazine 25 mg  by mouth 4 times a day.   SOCIAL HISTORY: The patient lives at home with her husband. Per records, smokes 1 pack a day. No alcohol use. No illicit drug use.   FAMILY HISTORY: Dad had Parkinson disease. Mother has diabetes.  REVIEW OF SYSTEMS: Unobtainable as the patient is intubated, sedated.   PHYSICAL EXAMINATION: VITAL SIGNS: Temperature 98.8, pulse 86, blood pressure 119/83, saturating 100% on ventilator.  GENERAL: Obese African American female patient lying in bed, intubated, ET tube in place.  HEENT: Atraumatic, normocephalic. Oral mucosa dry. Pupils bilaterally equal and reactive to light.  NECK: Supple. No thyromegaly or palpable lymph nodes.  CARDIOVASCULAR: S1, S2, without any murmurs. Peripheral pulses 2+. No edema.  RESPIRATORY: Very good air entry on both sides, clear to auscultation.  GASTROINTESTINAL: Soft abdomen, nontender, mildly distended. Bowel sounds present.  GENITOURINARY: No suprapubic distention.  MUSCULOSKELETAL: No joint swelling, redness, effusion of large joints.  NEUROLOGICAL: Moves all four extremities. Withdraws to pain.   LABORATORY STUDIES: Show glucose of 88. pH of 7.30, pCO2 51, pO2 270. Lactic acid 1.1.   EKG shows normal sinus rhythm with right bundle branch block.   Chest x-ray, portable, shows no acute abnormalities. No fractures.   ASSESSMENT AND PLAN: 1. Asystole cardiac arrest. It is not clear if patient really needed cardiopulmonary resuscitation at this time or had just a sinus pause. She does have risk factors with cardiomyopathy with ejection fraction of 35%. Person did receive propofol and ketamine for induction, although there was a gap between the episode and the induction, and I doubt if those medications caused this. She is also  on labetalol and oxycodone large doses. We will continue to monitor on the ventilator. The patient is presently under propofol for sedation without any problems with the heart rate. I have discussed case with Dr.  Mariah Milling with cardiology, who will be seeing the patient. I am awaiting troponin, also other laboratories. Discussed with Dr. Belia Heman, who had seen the patient.  2. Chronic systolic congestive heart failure seems stable. No pulmonary edema.  3. Hypertension. We will hold labetalol at this time.  4. Chronic pain syndrome. We will need to add pain medications when she is more awake, but at a lower dose.  5. Deep vein thrombosis prophylaxis, presently sequential compression devices. Will need Lovenox once we make sure her hemoglobin is okay. PT and INR normal.  6. CODE STATUS: FULL CODE.   Time spent today on this critically ill patient intubated was 45 minutes.     ____________________________ Molinda Bailiff Brockton Mckesson, MD srs:sg D: 06/30/2013 13:30:00 ET T: 06/30/2013 13:56:06 ET JOB#: [id number]  cc: Wardell Heath R. Elpidio Anis, MD, <Dictator> Dennison Mascot, MD  Wardell Heath [place] MD ELECTRONICALLY SIGNED 07/06/2013 17:51
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
## 2014-07-15 NOTE — Op Note (Signed)
PATIENT NAME:  Lisa Leblanc, Lisa Leblanc MR#:  [id number] DATE OF BIRTH:  1961-01-27  DATE OF PROCEDURE:  05/05/2013  PREOPERATIVE DIAGNOSIS: T10 compression fracture.   POSTOPERATIVE DIAGNOSIS: T10 compression fracture.   PROCEDURE: T10 biopsy and kyphoplasty.   ANESTHESIA: MAC.   SURGEON: Leitha Schuller, M.D.   DESCRIPTION OF PROCEDURE: The patient was given adequate sedation and then placed prone. C-arm was brought in, and good visualization of T10 was obtained in both AP and lateral projections with superior endplate compression. The skin was prepped with alcohol, and after appropriate patient identification and timeout procedures were completed, 5 mL of 1% Xylocaine was infiltrated subcutaneously on both the right and left sides. The back was then prepped and draped in the usual sterile fashion and a repeat timeout procedure carried out. A mixture of 1% Xylocaine and 0.5% Sensorcaine with epinephrine was infiltrated in the tracks on both the right and left side down to the pedicle. Incision was made on the right side. Was carried out and a trocar advanced under fluoroscopic guidance into the vertebral body. Small bone biopsy obtained. A balloon was inserted and inflated to 4 mL which gave partial correction of the superior endplate deformity. The balloon was let down, and cement was inserted when it was the appropriate consistency, with approximately 4.5 mL infiltrated. This gave very good interdigitation and tracked up into the cleft of the superior endplate fracture. There appeared to be very good fill without extravasation. After adequate cement fill, the trocar was removed and permanent C-arm views were obtained. Only the right-sided stick was required as the drill passed the midline and the balloon was in the central position in the vertebral body. The wound was closed with Dermabond, followed by a Band-Aid, and the patient was sent to the recovery room in stable condition.   ESTIMATED BLOOD LOSS:  Minimal.   COMPLICATIONS: None.   SPECIMEN: T10 vertebral body biopsy.   ____________________________ Leitha Schuller, MD mjm:gb D: 05/05/2013 20:00:30 ET T: 05/06/2013 03:04:43 ET JOB#: [id number]  cc: Leitha Schuller, MD, <Dictator> Leitha Schuller MD ELECTRONICALLY SIGNED 05/06/2013 12:46

## 2014-07-15 NOTE — Consult Note (Signed)
PATIENT NAME:  Lisa Leblanc, Lisa Leblanc MR#:  [id number] DATE OF BIRTH:  1960/09/18  DATE OF CONSULTATION:  01/31/2014  REFERRING PHYSICIAN:  Dr. Cyril Loosen from ER  CONSULTING PHYSICIAN:  Hope Pigeon. Elisabeth Pigeon, MD  CHIEF COMPLAINT: Chest pain and shortness of breath.   HISTORY OF PRESENT ILLNESS: This is a 54 year old female who has a past history of chronic low back pain, systolic congestive heart failure with ejection fraction 35%, hypertension, diabetes, and smoker. She is on chronic pain medication, but as per her coworker and good friend who is present in the room since yesterday she is complaining of pain in her chest and back she was feeling short of breath. She came to the Emergency Room and was agitated and was not a very good historian so history obtained from her friend who is present in the room. The ER physician initially thought maybe overdose of pain medication and gave some Narcan and the patient became very agitated and complaining of pain and trying to pull out IV lines and wires and her gown, so gave her morphine injection and gave her Ativan injection and so during my examination she is drowsy but opens her eyes and then goes back to sleep to stimuli so history mainly obtained from ER nurse and her friend and coworker present in the room. As per her, she did not have any cough or fever, did not have any urinary symptoms, was just complaining of chest pain and feeling short of breath. The pain was also going to her back. On additional note, she is on chronic pain medication because of multiple back surgeries and has been having chronic constipation.   REVIEW OF SYSTEMS: Unable to get as the patient is drowsy.   PAST MEDICAL HISTORY: 1.  Chronic low back pain.  2.  Systolic congestive heart failure with ejection fraction 35%.  3.  Hypertension.  4.  Diabetes.  5.  Tobacco abuse.   PAST SURGICAL HISTORY:  1.  Cholecystectomy. 2.  Left knee AC ligament repair.   SOCIAL HISTORY: Lives  at home with her husband. Smokes 1 pack of cigarettes every day. No alcohol. 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   FAMILY HISTORY: Father with Parkinson disease and mother has diabetes.  HOME MEDICATIONS: 1.  Ranitidine 75 mg tablet 2 times a day.  2.  Potassium chloride 10 mEq once a day.  3.  Ondansetron 8 mg oral every 8 hours.  4.  Norco tablet 1 to 2 tablets every 8 hours as needed for pain.  5.  Montelukast 10 mg oral tablet once a day.  6.  Linzess 145 mcg oral tablet once a day.  7.  Lasix 80 mg oral tablet 2 times a day.  8.  Labetalol 300 mg oral tablet 3 times a day.  9.  Hydrochlorothiazide/lisinopril 12.5/20 mg oral tablet 2 tablets once a day.  10.  Hydralazine 100 mg oral 4 times a day.  11.  Carvedilol 25 mg tablet 2 times a day.  12.  Amlodipine 5 mg once a day.  13.  Albuterol 4 times a day for wheezing as needed.   PHYSICAL EXAMINATION: VITAL SIGNS: In the ER, temperature 98.4, pulse rate 118, respiration was up to 30 but after she is sedated it came down to 20, blood pressure on presentation 135/89 and went up to 171/108, and pulse ox 98 with 4 liters oxygen supplementation.  GENERAL:  The patient is drowsy, but arousable to stimuli, goes back to sleep without having meaningful conversation.  HEENT: Head and neck atraumatic. Pupils bilateral equally reactive to light. Conjunctivae pink. Oral mucosa moist.  NECK: Supple. No JVD.  RESPIRATORY: Bilateral equal air entry, mild wheezing present.  CARDIOVASCULAR: S1 and S2 present, regular, tachycardia. No local tenderness on the chest.  ABDOMEN: Soft, distended. No organomegaly felt.  JOINTS: No swelling or tenderness.  NEUROLOGICAL: Moves all 4 limbs to stimuli, but very drowsy so not following commands. No rigidity or tremor appreciated.  PSYCHIATRIC: Unable to assess as the patient is drowsy currently.  LEGS: No edema.   DIAGNOSTIC DATA: Important  laboratory results: Glucose 106. BNP 1673. BUN 20, creatinine 1.3, sodium 138, potassium 3, chloride 100, CO2 is 30. Osmolality 279. Calcium 8.6. Troponin 0.08 and on further follow-up came to 0.09.   WBC 12.1, hemoglobin 10.9, platelet count 346,000, MCV 84. D-dimer is more than 7500 and urinalysis is grossly negative.   ABG is done: PH 7.53, pCO2 of 31 and pO2 is 73 on 32% nasal cannula oxygen supplementation.  At the time when I was called for consult, chest x-ray, portable, was available. Cardiomegaly without failure.   Later on I ordered a CT of the chest without contrast and reported evidence suggesting of flap in the aortic arch concerning for localized aortic dissection. Difficult study because of lack of contrast. Infiltrate in the left lower lobe with pleural thickening along with left lateral hemithorax, underlying emphysema   EKG: Sinus tachycardia.   ASSESSMENT AND PLAN: A 54 year old female with chronic pain and history of diabetes, congestive heart failure and smoking came to the Emergency Room with chest pain and shortness of breath and had respiratory alkalosis because of tachypnea and tachycardia.  1.  Altered mental status with respiratory alkalosis and hypoxia. Most likely this is due to combination off pain medications as a result of chest pain, which might be either secondary to pneumonia or possibly pulmonary embolism which is causing her chest pain and as a result of her tachypnea and hypoxia she has respiratory alkalosis and altered mental status due to hypoxia. As there was not very clear answers to all her presentation, we ordered CT of the chest without contrast as she cannot have contrast and other work-up to rule out her pulmonary embolism or to look for pneumonia. CT chest is reported to be having a flap and possibility of aortic dissection in arch of aorta, so initially the plan was to admit the patient on medical service, but after having this finding I had discussion  with Dr. Cyril Loosen again and he discussed with vascular and thoracic surgeon and they are working on further diagnostic study and maybe transferring the patient to a tertiary care center for further management. Meanwhile, we will give labetalol drip to keep the patient's blood pressure under control and prevent further damage. No anticoagulation.  2.  Acute respiratory failure with hypoxia and tachypnea. This may be due to her pain. There is slight evidence of atelectasis versus infiltrate in left lower lobe of lung. Maybe there is a touch of pneumonia present. If the other work-up comes out to be negative then she might benefit from antibiotics.  3.  History of congestive heart failure with ejection fraction 35%. Currently, she is not in heart failure so will hold Lasix and we will continue monitoring.  4.  Hypertension. As there is suspicion of aortic dissection, we will give labetalol drip to maintain the blood pressure  currently and the ER physician will do MRI as we cannot do the CAT scan of chest with contrast for further diagnosis. 5.  Diabetes. We will keep on fingerstick checking without coverage because she is currently drowsy with medication effect.  CONDITION: Critical.   Plan discussed with the ER physician, radiologist and nurse in the Emergency room. Initially worked on admitting the patient and later on after finding this dissection again had discussion with ER team.  TOTAL CRITICAL CARE TIME: 80 minutes. ____________________________ Hope Pigeon Elisabeth Pigeon, MD vgv:sb D: 01/31/2014 12:58:05 ET T: 01/31/2014 13:19:48 ET JOB#: [id number]  cc: Hope Pigeon. Elisabeth Pigeon, MD, <Dictator> Altamese Dilling MD ELECTRONICALLY SIGNED 02/14/2014 19:12

## 2014-07-15 NOTE — Op Note (Signed)
PATIENT NAME:  Lisa Leblanc, ESPITIA MR#:  [id number] DATE OF BIRTH:  11/10/1960  DATE OF PROCEDURE:  06/30/2013  PREOPERATIVE DIAGNOSIS: T11 and T12 compression fractures.   POSTOPERATIVE DIAGNOSIS: T11 and T12 compression fractures.   PROCEDURE: Attempted kyphoplasty T11 and T12.   ANESTHESIA: MAC.  SURGEON:  Leitha Schuller, MD   DESCRIPTION OF PROCEDURE: The patient was brought to the operating room and placed in a prone position. C-arm was brought in and good visualization of the level to be treated was obtained.  Her back was then prepped with alcohol and 5 mL of Xylocaine 1% was infiltrated subcutaneously on the right and left side for initial local anesthesia. Next, the back was prepped and draped in the usual sterile fashion, and with a spinal needle, local anesthetic was infiltrated down to the pedicle on both sides at T11 and T12 with a 50-50 mix of 1% Xylocaine, 0.5% Sensorcaine with epinephrine. Following this and having previously had the appropriate patient identification, timeout procedure was completed. T11 was approached first, the right side, with the trocar being entered and appropriate position and advanced into vertebral body. A biopsy was obtained and drilling carried out followed by placement of the balloon, inflation of balloon to approximately 2.5 mL.  Next, going to the left side at T12, a small incision was made and the trocar was placed to the level of the pedicle. At this point, anesthesia informed me that the patient was in asystole and the procedure was abandoned. The balloon was deflated and removed from the T11 vertebral body. Trocars were removed and the patient placed supine so that chest compressions could be performed.   ESTIMATED BLOOD LOSS: Minimal.   COMPLICATIONS: Asystole with resuscitation performed by anesthesia staff and patient was able to be transferred to CCU.   SPECIMEN: T11 bone biopsy, but the kyphoplasty itself could not be performed because of  medical complication.  CONDITION:  To CCU, is critical, and care taken over by a hospitalist and intensivist.    ____________________________ Leitha Schuller, MD mjm:dd D: 06/30/2013 20:54:37 ET T: 06/30/2013 22:50:02 ET JOB#: [id number]  cc: Leitha Schuller, MD, <Dictator> Leitha Schuller MD ELECTRONICALLY SIGNED 07/01/2013 7:54
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
## 2014-07-15 NOTE — Discharge Summary (Signed)
PATIENT NAME:  Lisa Leblanc, Lisa Leblanc MR#:  [id number] DATE OF BIRTH:  24-Apr-1960  DATE OF ADMISSION:  06/30/2013 DATE OF DISCHARGE:  07/06/2013  CONSULTANTS: Dr. Rosita Kea from orthopedics, Dr. Belia Heman from pulmonary, Dr. Mariah Milling and Dr. Kirke Corin from cardiology, and palliative care.  PRIMARY CARE PHYSICIAN: Dr. Thana Ates.   CHIEF COMPLAINT: Bradycardia, asystole, and cardiac arrest during orthopedic surgery.   FINAL DIAGNOSES: 1.  Cardiac arrest.  2.  Acute respiratory failure.  3.  Elevated troponins likely due to demand ischemia.  4.  Accelerated hypertension.  5.  Acute renal failure.  6.  Chronic systolic congestive heart failure with ejection fraction of about 25% to 35%.  7.  Chronic back pain.  8.  Vertebral fractures. 9.  Chronic pain syndrome.  10.  Constipation.  11.  History of diabetes.  12.  History of tobacco abuse.  DISCHARGE MEDICATIONS: Vitamin D3 1000 units once a day, oxycodone 80 mg extended release 2 times a day, oxycodone 10 mg 1 tab every 4 to 6 hours as needed for pain, hydralazine 50 mg 3 times a day, lisinopril 5 mg daily, carvedilol 25 mg 2 times a day, prednisone 20 mg 2 tabs once a day for 5 days then stop, albuterol 2 puffs 4 times a day as needed for wheezing.   DIET: Low sodium, low fat, low cholesterol.   ACTIVITY: As tolerated.   DISCHARGE FOLLOWUP: Please follow with outpatient PT, follow with cardiology within 1 to 2 weeks, follow with orthopedics surgery within 1 to 2 weeks, and please follow with PCP within 1 to 2 weeks.   DISPOSITION: Home.   SIGNIFICANT LABS AND IMAGING: Initial glucose was 88, BUN 15, creatinine 1.26, potassium 3.1. Initial troponin 0.09, then 0.06, then normal. U-tox positive for benzodiazepines, cannabinoids, opiates, and tricyclics. Initial white count 15.8, hemoglobin 10.7.  Echocardiogram showed EF of 25% to 30%, normal RVSP, normal right ventricular size and systolic function.  BRIEF HISTORY AND HOSPITAL COURSE: For full details of  H and P, please see the dictation on April 9 by Dr. Elpidio Anis, but briefly this is a 54 year old with history of CHF and hypertension with new diagnosis of cardiomyopathy with EF of about 35% and uncontrolled hypertension who had presented to the hospital to outpatient surgery center for kyphoplasty of T11-T12 vertebral fractures. The patient was in the prone position and after anesthesia induction went bradycardic and asystolic. CPR was initiated in the prone position and then flipped over to supine position. After a short episode of CPR, the patient had a rhythm and a blood pressure. She was intubated, transferred to ICU, and cardiology and pulmonary were consulted.   She was maintained on intubation and extubated. The patient likely went into respiratory failure from pain meds plus anesthetics with uncontrolled hypertension likely prior to surgery. At this point, she has been weaned off of oxygen, and the patient towards the end of her hospital stay did have some wheezing. She states she could be on questionable Dulera and some red inhaler. She will be discharged with stacked prednisone for 5 days and was recommended to follow with her PCP and stop smoking. The reason for cardiac arrest is unclear. At this point, she has a normal sinus rhythm and we have optimized her medications and stopped the labetalol and put her on Coreg, low-dose ACE inhibitor, and hydralazine and with this blood pressure is significantly improved. The elevated troponins are likely demand ischemia and also possibly from CPR. The patient did have a bout of acute  renal failure due to poor p.o. intake and bout of hypotension. Her medications were held and then was started on some gentle fluids and at this point has resolved. The patient has chronic systolic CHF and did not appear to be in significant pulmonary edema. In regards to the chronic pain, given the cardiac arrest during surgery, at this point she would need optimization of her  medications and follow up with cardiology before further kyphoplasty is considered. At this point, her pain is better. She has ambulated with PT and her pain regimen has been adjusted. She needs to be evaluated for osteoporosis as an outpatient. At this point, the patient will be discharged with outpatient follow with PT, cardiology, and orthopedic surgery.   CODE STATUS: FULL.  TOTAL TIME SPENT: About 40 minutes.   ____________________________ Krystal Eaton, MD sa:sb D: 07/07/2013 08:18:08 ET T: 07/07/2013 10:40:37 ET JOB#: [id number]  cc: Krystal Eaton, MD, <Dictator> Dennison Mascot, MD Antonieta Iba, MD Leitha Schuller, MD Krystal Eaton MD ELECTRONICALLY SIGNED 07/21/2013 14:27

## 2014-07-23 NOTE — Consult Note (Signed)
 General Aspect Primary Cardiologist: Dr. Gollan, MD ____________  54 year old with long history of smoking with chronic SOB and continued smoking history, COPD, malignant hypertension, on chronic opioid management for severe back pain, type 2 diabetes,??chronic combined systolic and diastolic CHF (originally diagnosed 05/2013 during hospital admission), uncontrolled hypertension,??and elevated troponin from demand ischemia. She also has history of cardiac arrest during back surgery in April 2015 requiring CPR, surgery aborted, extubated 07/01/2013-severe hypertension at the time. She had a recent admission at [facility] 01/31/2014-02/08/2014 with a descending thoracic aorta dissection. She presented with acute back pain, dissection confirmed on MRI extending from beyond the takeoff of the left subclavian distally. Renal arteries were not compromised, advised medical management at this time with repeat MRA at a later date who was directly admitted directly from our office on 04/25/2014 with increased dyspnea and 30 pound weight gain over the past 2 weeks.  __________________  PMH: 1. Ongoing tobacco abuse 2. NICM/Chronic combined CHF 3. Uncontrolled HTN 4. History of cardiac arrest 5. Severe back pain on chronic opiods  6. DM2 7. Descending aortic dessection 8. Obesity 9. Syncope ________________   Present Illness 54 year old female with the above complex problem list who was directly admitted from our office on 04/25/2014 secondary to increased dyspnea and 30 pound weight gain over the past 2 weeks.   She reports not eating well for several months and will have force herself to eat at times. She has been eating Cheerios 3 times daily for the past couple of weeks. She eats out at a restaurant each Sunday, otherwise she avoids restaurants. She does not apply salt to her foods. She has been increasing her PO fluid intake as she has been thirsty lately. Over the past 2 weeks she has noticed a significant  weight gain of 30 pounds since her last OV with Dr. Gollan. She has been quite SOB. She is sleeping with 8 pillows. She notes early satiety. Some intermittent chest pain. Her legs feel like they weigh 50 pounds each and she cannot lift them up to walk so she must slide them across the floor. She must take frequent breaks with ambulation. She went up on her Lasix to 80 mg daily 2 days ago but has not noticed any change in her voiding.  She was directly admitted on the afternoon on 2/2 secondary to the above. She did go home prior to check on some items prior to her arrival at [facility]. Upon her arrival to [facility] her troponin was found to be negative x 3, SCr 1.56-->1.69, K+4.0-->3.7, WBC count 16.4. CXR without pleural effusions. She apparently had a difficult time getting her chronic pain medications over night and threatened to leave AMA. She has diuresed 1560 mL for the admission so far on IV Lasix 40 mg tid. She still notes significant dyspnea. She is more concerned with her pain medications than her SOB at this point.   Physical Exam:  GEN no acute distress, obese   HEENT hearing intact to voice   NECK supple   RESP normal resp effort  crackles   CARD Tachycardic  No murmur   ABD denies tenderness  soft  distended   EXTR 2+ pitting edema to the mid shins   SKIN normal to palpation   NEURO cranial nerves intact   PSYCH alert, A+O to time, place, person, good insight   Review of Systems:  General: Fatigue  Weakness   Skin: No Complaints   ENT: Sore Throat    nasal congestion   Eyes: No Complaints   Neck: No Complaints   Respiratory: Frequent cough  Short of breath  Wheezing  Sputum   Cardiovascular: Chest pain or discomfort  Dyspnea  Orthopnea  Edema   Gastrointestinal: No Complaints   Genitourinary: No Complaints   Vascular: Calf pain with walking   Musculoskeletal: No Complaints   Neurologic: No Complaints   Hematologic: No Complaints   Endocrine: No Complaints    Psychiatric: No Complaints   Review of Systems: All other systems were reviewed and found to be negative   Medications/Allergies Reviewed Medications/Allergies reviewed   Family & Social History:  Family and Social History:  Family History Hypertension  Diabetes Mellitus   Social History negative ETOH, negative Illicit drugs   + Tobacco Current (within 1 year)   Place of Living Home     COPD:    pleursy:    chronic back pain:    Hypertension:    T 10 kyphoplasty:    Cholecystectomy:    Gall Bladder:    Knee Surgery - Left:   Home Medications: Medication Instructions Status  hydrALAZINE 100 mg oral tablet 1 tab(s) orally 3 times a day Active  albuterol CFC free 90 mcg/inh inhalation aerosol  inhaled 4 times a day as needed for wheezing Active  multivitamin with minerals 1 tab(s) orally once a day Active  montelukast 10 mg oral tablet 1 tab(s) orally once a day (in the evening) Active  ondansetron 8 mg oral tablet 1 tab(s) orally every 8 hours Active  Linzess 145 mcg oral capsule 1 cap(s) orally once a day Active  amLODIPine 10 mg oral tablet 1 tab(s) orally once a day Active  cloNIDine 0.3 mg/24 hr transdermal film, extended release 1 patch transdermal once a week Active  cyclobenzaprine 10 mg oral tablet 1 tab(s) orally once a day, As Needed Active  Lasix 40 mg oral tablet 1 tab(s) orally once a day Active  labetalol 300 mg oral tablet 1 tab(s) orally 3 times a day Active  potassium chloride 10 mEq oral capsule, extended release 1 cap(s) orally once a day Active  oxyCODONE 20 mg oral tablet 1 tab(s) orally every 6 hours Active  oxymorphone extended release 20 mg oral tablet, extended release 1 tab(s) orally every 12 hours Active  ranitidine 75 mg oral tablet 2 tab(s) orally once a day Active  Nitrostat 0.4 mg sublingual tablet 1 tab(s) sublingual every 5 minutes, As Needed - for Chest Pain Active   Lab Results:  Routine Chem:  02-Feb-16 18:30   BUN  37   Creatinine (comp)  1.56  Potassium, Serum 4.0  03-Feb-16 05:12   Glucose, Serum  118  BUN  40  Creatinine (comp)  1.69  Sodium, Serum 143  Potassium, Serum 3.7  Chloride, Serum 105  CO2, Serum 31  Calcium (Total), Serum 8.6  Anion Gap 7  Osmolality (calc) 296  eGFR (African American)  40  eGFR (Non-African American)  33 (eGFR values <60mL/min/1.73 m2 may be an indication of chronic kidney disease (CKD). Calculated eGFR, using the MRDR Study equation, is useful in  patients with stable renal function. The eGFR calculation will not be reliable in acutely illpatients when serum creatinine is changing rapidly. It is not useful in patients on dialysis. The eGFR calculation may not be applicable to patients at the low and high extremes of body sizes, pregnant women, and vegetarians.)  Cardiac:  02-Feb-16 18:30   Troponin I < 0.02 (0.00-0.05 0.05 ng/mL or   less: NEGATIVE  Repeat testing in 3-6 hrs  if clinically indicated. >0.05 ng/mL: POTENTIAL  MYOCARDIAL INJURY. Repeat  testing in 3-6 hrs if  clinically indicated. NOTE: An increase or decrease  of 30% or more on serial  testing suggests a  clinically important change)  03-Feb-16 01:19   Troponin I 0.02 (0.00-0.05 0.05 ng/mL or less: NEGATIVE  Repeat testing in 3-6 hrs  if clinically indicated. >0.05 ng/mL: POTENTIAL  MYOCARDIAL INJURY. Repeat  testing in 3-6 hrs if  clinically indicated. NOTE: An increase or decrease  of 30% or more on serial  testing suggests a  clinically important change)    05:12   Troponin I 0.02 (0.00-0.05 0.05 ng/mL or less: NEGATIVE  Repeat testing in 3-6 hrs  if clinically indicated. >0.05 ng/mL: POTENTIAL  MYOCARDIAL INJURY. Repeat  testing in 3-6 hrs if  clinically indicated. NOTE: An increase or decrease  of 30% or more on serial  testing suggests a  clinically important change)  CK, Total 110  CPK-MB, Serum 3.1 (Result(s) reported on 26 Apr 2014 at 05:42AM.)  Routine Hem:   02-Feb-16 18:30   WBC (CBC)  16.4  Hemoglobin (CBC)  10.3  Hematocrit (CBC)  31.3  Platelet Count (CBC) 281   EKG:  EKG Interp. by me   Interpretation sinus tachycardia, 105 bpm, left axis deviation, TWI III   Radiology Results: XRay:    02-Feb-16 19:01, Chest Portable Single View  Chest Portable Single View   REASON FOR EXAM:    chf  COMMENTS:       PROCEDURE: DXR - DXR PORTABLE CHEST SINGLE VIEW  - Apr 25 2014  7:01PM     CLINICAL DATA:  Cold symptoms for 3 weeks, reported 45 lb weight  gain in 2 weeks    EXAM:  PORTABLE CHEST - 1 VIEW    COMPARISON:  01/31/2014    FINDINGS:  Moderate enlargement of the cardiac silhouette with aortic ectasia  and unfolding reidentified. Left mid lung zone scarring is stable.  Prominence of the central pulmonary arteries with tapering suggests  pulmonary arterial hypertension. No pleural effusion.     IMPRESSION:  No new acute abnormality.      Electronically Signed    By: Gretchen  Green M.D.    On: 04/25/2014 20:42         Verified By: GRETCHEN E. GREEN, M.D.,    Contrast - Iodinated Radiocontrast Dye: SOB, Hives, Rash  Iodine: Hives  Vital Signs/Nurse's Notes: **Vital Signs.:   03-Feb-16 08:40  Vital Signs Type Pre Medication  Temperature Temperature (F) 97.8  Celsius 36.5  Pulse Pulse 108  Respirations Respirations 20  Systolic BP Systolic BP 171  Diastolic BP (mmHg) Diastolic BP (mmHg) 104  Mean BP 126  Pulse Ox % Pulse Ox % 93  Pulse Ox Activity Level  With exertion  Oxygen Delivery 2L    Impression 54 year old with long history of smoking with chronic SOB and continued smoking history, COPD, malignant hypertension, on chronic opioid management for severe back pain, type 2 diabetes,??chronic combined systolic and diastolic CHF (originally diagnosed 05/2013 during hospital admission), uncontrolled hypertension,??and elevated troponin from demand ischemia. She also has history of cardiac arrest during back  surgery in April 2015 requiring CPR, surgery aborted, extubated 07/01/2013-severe hypertension at the time. She had a recent admission at [facility] 01/31/2014-02/08/2014 with a descending thoracic aorta dissection. She presented with acute back pain, dissection confirmed on MRI extending from beyond the takeoff of the left   subclavian distally. Renal arteries were not compromised, advised medical management at this time with repeat MRA at a later date who was directly admitted directly from our office on 04/25/2014 with increased dyspnea and 30 pound weight gain over the past 2 weeks.  1. Acute on chronic combined CHF: -Continue IV diuresis with Lasix 40 mg tid with monitoring SCr (currently stable) -Echo pending to evaluate LV function and wall motion -Change labetalol to Coreg 12.5 mg bid -Continue hydralazine 100 mg tid -Limit fluid intake, she has been drinking large amounts of water lately   2. HTN: -Uncontrolled, afterload reduction is key -Norvasc may not be the best antihypertensive given her lower extremity edema, continue to monitor her progress -If SCr remains stable would add ACEi or ARB -Changes as above  3. Leukocytosis: -Question infection vs inflammation  4. Chronic pain medication: -She is on Opana 20 mg q 12 hours and oxycontin 40 mg q 12 hours prn   Electronic Signatures for Addendum Section:  Leonie Man (MD) (Signed Addendum 03-Feb-16 11:49)  I have seen & examined the patient this AM along with MR. Jaliel Deavers, PA-C.. I have reviewed the chart & available clinical data. Admitted from our clinic yesterday or prolonged progression of CHF exacerbation (combined systolic & diastolic).  BP uncontrolled & wgt up ~50lb.  Needs IV diuresis - may see a rise in Cr before it normalizes.  BP control for afterload reduction as noted above (per our discussion). Despted her complex medical history - she continues to smoke -- cessation counseling recommended prior to d/c.  I anticipate  several days to > a week of diuresis given her extent of wgt gain.  Will follow along.  DH  DM control per IM service.   Electronic Signatures: Rise Mu (PA-C)  (Signed 03-Feb-16 11:15)  Authored: General Aspect/Present Illness, History and Physical Exam, Review of System, Family & Social History, Past Medical History, Home Medications, Labs, EKG , Radiology, Allergies, Vital Signs/Nurse's Notes, Impression/Plan Leonie Man (MD)  (Signed 03-Feb-16 11:49)  Co-Signer: General Aspect/Present Illness, History and Physical Exam, Review of System, Family & Social History, Past Medical History, Home Medications, Labs, EKG , Radiology, Allergies, Vital Signs/Nurse's Notes, Impression/Plan   Last Updated: 03-Feb-16 11:49 by Leonie Man (MD)

## 2014-07-23 NOTE — Discharge Summary (Signed)
PATIENT NAME:  Lisa Leblanc, Lisa Leblanc MR#:  [id number] DATE OF BIRTH:  June 05, 1960  DATE OF ADMISSION:  04/25/2014 DATE OF DISCHARGE:  04/28/2014  DISCHARGE DIAGNOSES: 1.  Acute on chronic diastolic congestive heart failure.  2.  Chronic obstructive pulmonary disease.  3.  Constipation.  4.  Hypertension.  5.  Chronic kidney disease stage III.  6.  Liver nodular of 6 mm of the right inferior lobe which needs followup as outpatient with a CAT scan.   CONSULTATIONS: Dr. Kirke Corin with cardiology.   DISCHARGE MEDICATIONS: 1.  Albuterol inhaled 4 times a day as needed.  2.  Labetalol 300 mg oral 3 times a day.  3.  Potassium chloride 10 mEq daily.  4.  Multivitamin 1 tablet daily.  5.  Montelukast 10 mg daily.  6.  Zofran 8 mg every 8 hours.  7.  Linzess 145 mcg oral daily.  8.  Amlodipine 10 mg daily.  9.  Clonidine 0.3 mg transdermal patch once a week.  10.   Flexeril 10 mg daily as needed.  11.   Hydralazine 100 mg oral 3 times a day.  12.   Oxycodone 20 mg oral every 6 hours.  13.   Oxymorphone extended release 20 mg 2 times a day.  15.   Nitrostat 0.4 sublingual as needed for chest pain.  16.   Levaquin 250 mg oral once a day for 5 days.  17.   Demadex 20 mg oral 2 times a day.   DISCHARGE INSTRUCTIONS: Home health with PT, low sodium diet, daily fluids less than 2 liters, regular consistency, activity as tolerated. Follow up with Dr. Kirke Corin in 1 to 2 weeks. The patient has been asked to check her weight every day, take an extra fluid pill if she has more than 3 pounds weight gain.   Chest x-ray showed pulmonary edema.   ADMITTING HISTORY AND PHYSICAL AND HOSPITAL COURSE: Please see detailed H and P dictated by Dr. Allena Katz. In brief, a 54 year old female patient with history of chronic diastolic CHF, CKD stage III, presented to the hospital, sent in from Dr. Mariah Milling of cardiology for worsening lower extremity edema and congestive heart failure. The patient was admitted onto the hospitalist  service, started on aggressive diuresis with Lasix 40 mg IV 3 times a day. She diuresed well. The patient feels significantly better by the day of discharge. No chest pain. No shortness of breath. Edema has resolved. The patient was seen by cardiology. Her oral diuretics were changed from Lasix to torsemide at the time of discharge and is being discharged home in a stable condition.   The patient did have an ultrasound of the abdomen for some distention. There is no fluid, nothing acute. She does have a small 6 mm nodule in the right inferior liver lobe, which does not seem to be a tumor but will need outpatient followup with a CAT scan by her primary care physician.   PHYSICAL EXAMINATION: Prior to discharge, the patient's lungs sound clear. No edema found. S1, S2 heard. The patient has ambulated well. Alert and oriented x 3.   TIME SPENT ON DAY OF DISCHARGE IN DISCHARGE ACTIVITY: 40 minutes.    ____________________________ Molinda Bailiff Kainan Patty, MD srs:at D: 04/29/2014 16:24:00 ET T: 04/29/2014 17:00:09 ET JOB#: [id number]  cc: Wardell Heath R. Elpidio Anis, MD, <Dictator> Duanne Limerick, MD Antonieta Iba, MD Orie Fisherman MD ELECTRONICALLY SIGNED 05/01/2014 3:56

## 2014-07-23 NOTE — H&P (Signed)
PATIENT NAME:  Lisa Leblanc, Lisa Leblanc MR#:  [id number] DATE OF BIRTH:  Jan 29, 1961  DATE OF ADMISSION:  04/25/2014  PRIMARY CARE PHYSICIAN: Dr. Thana Ates.   CARDIOLOGY: Dr. Mariah Milling.    CHIEF COMPLAINT: Increasing shortness of breath and weight gain.   HISTORY OF PRESENT ILLNESS: Lisa Leblanc is a 54 year old African-American female with past medical history of aortic dissection, history of hypertension, ongoing tobacco abuse, history of CKD stage III-IV, who was a direct admit from Dr. Windell Hummingbird office for above chief complaint. The patient states she has gained about 30 pounds in the last 1 month. Her baseline weight is around 140. She also feels very short of breath recently, was started on oxygen about 2 weeks ago. She was found to be in congestive heart failure and is being admitted for further evaluation and management.   PAST MEDICAL HISTORY:  1. Aortic dissection. The patient was transferred to tertiary care center.  2. COPD with ongoing tobacco abuse. Smoking cessation advised, about 4 minutes spent.  3. History of chronic back pain on significant amount of narcotics with narcotic dependence.  4. Hypertension.  5. T10 kyphoplasty.  6. Cholecystectomy.  7. Left knee surgery.   ALLERGIES: Contrast dye.   HOME MEDICATIONS:  1. Ranitidine 75 mg 2 tablets daily.  2. Potassium chloride 10 mEq daily.  3. Oxymorphone extended-release 20 mg 1 tablet every 12 hours.  4. Oxycodone 20 mg every 6 hour.  5. Zofran 8 mg every 8 hours.  6. Nitrostat 0.4 mg sublingual as needed.  7. Multivitamin p.o. daily.  8. Singular 10 mg daily.  9. Linzess 145 mcg p.o. daily.  10. Lasix 40 mg p.o. daily.  11. Labetalol 300 mg 1 tablet 3 times a day.  12. Hydralazine 100 mg 3 times a day.  13. Cyclobenzaprine 10 mg once a day as needed.  14. Clonidine 0.3 mg 1 patch once a week.   FAMILY HISTORY: Positive for hypertension and diabetes.   SOCIAL HISTORY: Smokes daily, on a daily basis, the patient does not  quantitate.  Non-alcoholic. Does not do any other drugs.   REVIEW OF SYSTEMS:  CONSTITUTIONAL: No fever. Positive for fatigue, weakness.  Positive for weight gain 30 pounds.  EYES: No blurry or double vision, glaucoma,  EARS, NOSE, AND THROAT: No tinnitus, ear pain, postnasal drip.  RESPIRATORY: No cough, wheeze, hemoptysis. Positive for COPD.   CARDIOVASCULAR: No chest pain. Positive for dyspnea on exertion, wheezing, and edema.  GASTROINTESTINAL: No nausea, vomiting, diarrhea, abdominal pain. No GERD.  GENITOURINARY: No dysuria, hematuria, or frequency.  ENDOCRINE: No polyuria, nocturia, or thyroid problems.  HEMATOLOGY: No anemia or easy bruising.  SKIN: No acne or rash.  MUSCULOSKELETAL: Positive for chronic back pain and arthritis. No swelling or gout.  NEUROLOGIC: No CVA, TIA, dementia, or dysarthria.  PSYCHIATRIC: No anxiety or depression.   All other systems reviewed and negative.   PHYSICAL EXAMINATION:  GENERAL: The patient is awake, alert, oriented x 3, not in acute distress.  VITAL SIGNS: Afebrile. Pulse is 112, blood pressure is 167/96, pulse oximetry is 93% on 2 liters.  HEENT: Atraumatic, normocephalic. Pupils PERRLA.  EOM intact. Oral mucosa is moist.  NECK: Supple. No JVD. No carotid bruit.  RESPIRATORY: Decreased breath sounds at the bases.  CARDIAC:  No murmur heard. PMI not lateralized. Chest nontender. EXTREMITIES: Good pedal pulses. There are good femoral pulses. There is pitting edema about 3 + present in the ankles.  ABDOMEN: Soft, benign, nontender. No organomegaly.  NEUROLOGIC:  No neurologic deficit.  PSYCHIATRIC: The patient is awake, alert, oriented x 3.   DIAGNOSTIC DATA: Creatinine is 1.56. Glucose is 109, BUN is 37, potassium is 4.0. White count is 16.4, H and H is 10.3 and 31.3. First set of troponin is less than 0.02.   EKG and chest x-ray are pending.   ASSESSMENT: A 54 year old, Lisa Leblanc, with history of chronic obstructive pulmonary  disease, chronic back pain, narcotic dependence, and history of hypertension, comes in with:   1.  Acute congestive heart failure, suspect systolic with the outpatient echo showed ejection fraction of around 45%. We will admit the patient to telemetry floor, diurese with IV Lasix around the clock, monitor Is and Os, avoid nephrotoxins.  Cardiology consultation with Dr. Mariah Milling. The patient is going to be continued on her hydralazine and labetalol. We will add ACE inhibitors if creatinine remains stable. Cycle cardiac enzymes x 3, check echo Doppler of the heart in the morning.  2.  Hypertension. Resume home medications, which are amlodipine and clonidine along with hydralazine and labetalol.  3.  Chronic obstructive pulmonary disease with ongoing tobacco abuse. Smoking cessation advised, about 3 minutes spent. The patient wants a nicotine patch, that has been ordered. She does not seem to be in COPD exacerbation at this time. We will continue her oral inhalers and nebulizers as needed.  4.  Deep vein thrombosis prophylaxis. Subcutaneous heparin t.i.d.  5.  Chronic kidney disease stage III. The patient's creatinine is stable at this time. Consider outpatient nephrology followup.     Hospital admission plan was discussed with the patient and the patient's husband who was present in the Emergency Room.   TIME SPENT: 50 minutes.    ____________________________ Wylie Hail Allena Katz, MD sap:bu D: 04/25/2014 19:52:13 ET T: 04/25/2014 20:18:22 ET JOB#: [id number]  cc: Pearle Wandler A. Allena Katz, MD, <Dictator> Willow Ora MD ELECTRONICALLY SIGNED 05/11/2014 23:22

## 2014-08-03 ENCOUNTER — Telehealth: Payer: Self-pay

## 2014-08-03 NOTE — Telephone Encounter (Signed)
Request from Disability [employer]in[employer] Services , sent to [employer] on 07-31-2014 .

## 2014-08-17 ENCOUNTER — Ambulatory Visit (INDEPENDENT_AMBULATORY_CARE_PROVIDER_SITE_OTHER): Payer: No Typology Code available for payment source | Admitting: Nurse Practitioner

## 2014-08-17 ENCOUNTER — Telehealth: Payer: Self-pay | Admitting: *Deleted

## 2014-08-17 ENCOUNTER — Encounter: Payer: Self-pay | Admitting: Nurse Practitioner

## 2014-08-17 VITALS — BP 140/90 | HR 94 | Ht 64.0 in | Wt 169.2 lb

## 2014-08-17 DIAGNOSIS — I5032 Chronic diastolic (congestive) heart failure: Secondary | ICD-10-CM

## 2014-08-17 DIAGNOSIS — I7100 Dissection of unspecified site of aorta: Secondary | ICD-10-CM | POA: Diagnosis not present

## 2014-08-17 DIAGNOSIS — I739 Peripheral vascular disease, unspecified: Secondary | ICD-10-CM

## 2014-08-17 DIAGNOSIS — I10 Essential (primary) hypertension: Secondary | ICD-10-CM

## 2014-08-17 DIAGNOSIS — R079 Chest pain, unspecified: Secondary | ICD-10-CM | POA: Diagnosis not present

## 2014-08-17 DIAGNOSIS — R002 Palpitations: Secondary | ICD-10-CM

## 2014-08-17 MED ORDER — DOXAZOSIN MESYLATE 4 MG PO TABS: 4.0000 mg | ORAL_TABLET | Freq: Two times a day (BID) | ORAL | Status: DC

## 2014-08-17 MED ORDER — AMLODIPINE BESYLATE 5 MG PO TABS: 5.0000 mg | ORAL_TABLET | Freq: Every day | ORAL | Status: DC
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
## 2014-08-17 NOTE — Telephone Encounter (Signed)
Request from Disability Determination Services , sent to [employer] on 08/17/14 . ° °

## 2014-08-17 NOTE — Patient Instructions (Addendum)
Medication Instructions:  Please continue current medications  Labwork: BMET  Testing/Procedures: MRA chest:  Thursday, June 2 @ 9:00 @ [place] Location (across from [facility]) 30 day monitor ABI  Follow-Up: W/ Dr. Dorris Fetch in [place] after MRA BP check 1-2 weeks    Magnetic Resonance Angiogram Magnetic resonance imaging (MRI) is a test that lets your health care provider see detailed pictures of the inside of your body without using X-rays. Instead, strong magnets and radio waves work together in a Data processing manager to form very detailed and sharp images. The images are viewed on a TV monitor in two- and three-dimensional form. The magnets and radio waves are harmless. Magnetic resonance angiogram, or magnetic resonance angiography (MRA), is an MRI done on your blood vessels. MRA is able to obtain detailed images of blood vessels and blood flow. It can be used to help diagnose and treat heart disorders, stroke, and blood vessel diseases. Contrast material may be injected to make MRA images even more clear. LET [facility] CARE PROVIDER KNOW ABOUT:  Previous surgeries you have had.  Any metal you may have in your body. The magnet used in MRA can cause metal objects in your body to move. Metal can also make it hard to get high-quality images. Objects that contain metal include:  A pacemaker or any other implants, such as an implanted neurostimulator, a metallic ear implant, or a metallic object within the eye socket.  Metal splinters.  Any bullet fragments.  A port for delivering insulin or chemotherapy.  Any tattoos. Some red dyes contain iron which is sometimes a problem.  If you are pregnant or think you may be pregnant. It is best to avoid this test during the first three months of pregnancy unless there is a significant risk of missing a serious diagnosis without performing the test.  If you are breastfeeding.  If you are afraid of cramped spaces (claustrophobic).  If claustrophobia is a problem, it usually can be relieved with mild sedatives or antianxiety medicines.  Any allergies you have.  All medicines you are taking, including vitamins, herbs, eye drops, creams, and over-the-counter medicines. BEFORE THE PROCEDURE  If you are breastfeeding, prepare as directed by your health care provider. You may need to pump breast milk before the exam to give to your baby until the contrast material, if used, has cleared from your body.  You will be asked to remove anything containing metal, such as any watches or jewelry you are wearing. You may also be asked to remove your makeup because some makeup contains traces of metal. Braces and fillings normally are not a problem. PROCEDURE  You may be given earplugs or headphones to listen to music. The machine can be noisy.  A contrast material may be injected.  MRA is done in a long, magnetic chamber. You will lie down on a platform that slides into the magnetic chamber. Once inside, you will still be able to talk to the health care provider.  You will be asked to hold very still. The health care provider will tell you when you can shift position. You may have to wait a few minutes to make sure the images produced during the procedure are readable. AFTER THE PROCEDURE  You may resume normal activities right away.  If you were given contrast material, it will pass naturally through your body within a day.  A health care provider experienced in MRA will analyze the results and send a report to your health care  provider, along with an interpretation of the findings. Document Released: 05/31/2003 Document Revised: 07/25/2013 Document Reviewed: 04/15/2013 [facility] Patient Information 2015 [place], [place]. This information is not intended to replace advice given to you by your health care provider. Make sure you discuss any questions you have with your health care provider. Ankle-Brachial Index Test The  Ankle-Brachial index is a test used to find peripheral vascular disease (PVD). PVD is also known as peripheral arterial disease (PAD). PVD is a blocking or hardening of the arteries anywhere within the circulatory system beyond the heart. The cause is cholesterol deposits within the blood vessels (atherosclerosis). These deposits cause arteries to narrow. The delivery of oxygen to tissues is impaired as a result. This can cause muscle pain and fatigue. This is called claudication.  PVD means there may also be build up of cholesterol in the:  Heart, increasing the risk for heart attacks.  Brain, increasing the risk for strokes. This test measures the blood flow in the arms and legs. The test also determines if blood vessels are clogged by cholesterol deposits.  HOW IS THE ANKLE-BRACHIAL INDEX TEST DONE? The test is done while you are lying down and resting. The arm (brachial) and ankle systolic pressure are measured. The measurements are taken two times on both sides. Systolic pressure is the pressure within the arteries when the heart pumps. The highest systolic pressure of the ankle is then divided by the highest arm systolic pressure. The result is the ankle-brachial pressure ratio or ABI. There should be a difference of less than 10 mm Hg. Sometimes this test is repeated after five minutes of exercising on a treadmill.  You may have leg pain during the treadmill portion of the test if you suffer from PAD. If the index number drops after exercise, this may show that PAD is present. Document Released: 03/14/2004 Document Revised: 03/15/2013 Document Reviewed: 05/05/2007 [facility] Patient Information 2015 [place], [place]. This information is not intended to replace advice given to you by your health care provider. Make sure you discuss any questions you have with your health care provider.

## 2014-08-18 ENCOUNTER — Other Ambulatory Visit: Payer: Self-pay

## 2014-08-18 DIAGNOSIS — R0602 Shortness of breath: Secondary | ICD-10-CM

## 2014-08-18 LAB — BASIC METABOLIC PANEL
BUN / CREAT RATIO: 23 (ref 9–23)
BUN: 49 mg/dL — ABNORMAL HIGH (ref 6–24)
CALCIUM: 9.1 mg/dL (ref 8.7–10.2)
CO2: 32 mmol/L — AB (ref 18–29)
CREATININE: 2.17 mg/dL — AB (ref 0.57–1.00)
Chloride: 93 mmol/L — ABNORMAL LOW (ref 97–108)
GFR calc Af Amer: 29 mL/min/{1.73_m2} — ABNORMAL LOW (ref >59)
GFR calc non Af Amer: 25 mL/min/{1.73_m2} — ABNORMAL LOW (ref >59)
Glucose: 134 mg/dL — ABNORMAL HIGH (ref 65–99)
Potassium: 3.2 mmol/L — ABNORMAL LOW (ref 3.5–5.2)
Sodium: 143 mmol/L (ref 134–144)

## 2014-08-22 ENCOUNTER — Encounter (INDEPENDENT_AMBULATORY_CARE_PROVIDER_SITE_OTHER): Payer: No Typology Code available for payment source

## 2014-08-22 DIAGNOSIS — R002 Palpitations: Secondary | ICD-10-CM | POA: Diagnosis not present

## 2014-08-23 ENCOUNTER — Telehealth: Payer: Self-pay | Admitting: *Deleted

## 2014-08-23 NOTE — Telephone Encounter (Signed)
[facility] calling asking if we can put the orders for MR/MRA CHEST/ABD/PEL W/O, they are going in tmrw for this test. Please just add this in.

## 2014-08-23 NOTE — Telephone Encounter (Signed)
Order was entered 08/17/14 during pt's ov.

## 2014-08-24 ENCOUNTER — Ambulatory Visit
Admission: RE | Admit: 2014-08-24 | Discharge: 2014-08-24 | Disposition: A | Discharge status: Home / Self Care | Payer: No Typology Code available for payment source | Source: Ambulatory Visit | Attending: Nurse Practitioner | Admitting: Nurse Practitioner

## 2014-08-24 DIAGNOSIS — R0602 Shortness of breath: Secondary | ICD-10-CM | POA: Insufficient documentation

## 2014-08-24 DIAGNOSIS — I712 Thoracic aortic aneurysm, without rupture: Secondary | ICD-10-CM | POA: Diagnosis not present

## 2014-08-24 DIAGNOSIS — I7100 Dissection of unspecified site of aorta: Secondary | ICD-10-CM

## 2014-08-24 DIAGNOSIS — M549 Dorsalgia, unspecified: Secondary | ICD-10-CM | POA: Insufficient documentation

## 2014-08-25 ENCOUNTER — Other Ambulatory Visit: Payer: Self-pay

## 2014-08-28 ENCOUNTER — Ambulatory Visit (INDEPENDENT_AMBULATORY_CARE_PROVIDER_SITE_OTHER): Payer: No Typology Code available for payment source

## 2014-08-28 DIAGNOSIS — I739 Peripheral vascular disease, unspecified: Secondary | ICD-10-CM

## 2014-08-31 ENCOUNTER — Ambulatory Visit: Payer: No Typology Code available for payment source

## 2014-08-31 DIAGNOSIS — R0602 Shortness of breath: Secondary | ICD-10-CM

## 2014-08-31 MED ORDER — AMLODIPINE BESYLATE 10 MG PO TABS: 10.0000 mg | ORAL_TABLET | Freq: Every day | ORAL | Status: DC

## 2014-08-31 NOTE — Patient Instructions (Addendum)
1.) Reason for visit: BP check  2.) Name of MD requesting visit: Ward Givens, NP  3.) H&P: hypertension  4.) ROS related to problem: hypertension  5.) Assessment and plan per MD: increase amlodipine to 10mg  daily   Per Ward Givens, NP, please increase your amlodipine to 10mg  daily Please get a blood pressure cuff and monitor your pressures daily

## 2014-09-08 ENCOUNTER — Encounter: Payer: Self-pay | Admitting: Physician Assistant

## 2014-09-08 ENCOUNTER — Telehealth: Payer: Self-pay | Admitting: *Deleted

## 2014-09-08 ENCOUNTER — Ambulatory Visit (INDEPENDENT_AMBULATORY_CARE_PROVIDER_SITE_OTHER): Payer: No Typology Code available for payment source | Admitting: Physician Assistant

## 2014-09-08 VITALS — BP 170/104 | HR 101 | Temp 98.1°F | Ht 64.0 in | Wt 182.5 lb

## 2014-09-08 DIAGNOSIS — R0602 Shortness of breath: Secondary | ICD-10-CM

## 2014-09-08 DIAGNOSIS — N183 Chronic kidney disease, stage 3 unspecified: Secondary | ICD-10-CM

## 2014-09-08 DIAGNOSIS — I5043 Acute on chronic combined systolic (congestive) and diastolic (congestive) heart failure: Secondary | ICD-10-CM | POA: Diagnosis not present

## 2014-09-08 DIAGNOSIS — M25559 Pain in unspecified hip: Secondary | ICD-10-CM

## 2014-09-08 DIAGNOSIS — R079 Chest pain, unspecified: Secondary | ICD-10-CM

## 2014-09-08 DIAGNOSIS — I7100 Dissection of unspecified site of aorta: Secondary | ICD-10-CM

## 2014-09-08 DIAGNOSIS — M25569 Pain in unspecified knee: Secondary | ICD-10-CM

## 2014-09-08 DIAGNOSIS — I10 Essential (primary) hypertension: Secondary | ICD-10-CM

## 2014-09-08 MED ORDER — POTASSIUM CHLORIDE ER 20 MEQ PO TBCR: 40.0000 mg | EXTENDED_RELEASE_TABLET | Freq: Two times a day (BID) | ORAL | Status: DC

## 2014-09-08 MED ORDER — CLONIDINE HCL 0.1 MG PO TABS: 0.1000 mg | ORAL_TABLET | Freq: Two times a day (BID) | ORAL | Status: DC

## 2014-09-08 MED ORDER — DOXAZOSIN MESYLATE 8 MG PO TABS: 8.0000 mg | ORAL_TABLET | Freq: Two times a day (BID) | ORAL | Status: DC

## 2014-09-08 MED ORDER — TORSEMIDE 20 MG PO TABS: 20.0000 mg | ORAL_TABLET | Freq: Every day | ORAL | Status: DC
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
## 2014-09-08 NOTE — Patient Instructions (Signed)
Medication Instructions:  Please stop Norvasc Please start clonidine 0.1 mg twice daily Please increase potassium to 40 mg twice daily Please increase Cardura to 8 mg twice daily  Please increase torsemide: - 60 mg in am, 40 mg in pm x 2 days - 40 mg in am, 40 mg in pm x 2 days - 20 mg in am, 20 mg in pm until recheck  Labwork: BMET today Repeat BMET on Monday  Testing/Procedures: None  Follow-Up: Call or return to clinic prn if these symptoms worsen or fail to improve as anticipated.

## 2014-09-08 NOTE — Telephone Encounter (Signed)
Pt is coming today at 2pm

## 2014-09-08 NOTE — Telephone Encounter (Signed)
Pt c/o swelling: STAT is pt has developed SOB within 24 hours  1. How long have you been experiencing swelling? Since yesterday   2. Where is the swelling located? Feet both   3.  Are you currently taking a "fluid pill"? Doubled the pills   4.  Are you currently SOB?  Yes, when her back was hurting last night it was so bad she got out of breath   5.  Have you traveled recently? No

## 2014-09-08 NOTE — Telephone Encounter (Signed)
Per Eula Listen, PA, please add pt onto his schedule to be seen this afternoon.

## 2014-09-09 LAB — BASIC METABOLIC PANEL
BUN/Creatinine Ratio: 20 (ref 9–23)
BUN: 40 mg/dL — ABNORMAL HIGH (ref 6–24)
CO2: 28 mmol/L (ref 18–29)
CREATININE: 1.97 mg/dL — AB (ref 0.57–1.00)
Calcium: 9.1 mg/dL (ref 8.7–10.2)
Chloride: 93 mmol/L — ABNORMAL LOW (ref 97–108)
GFR calc Af Amer: 32 mL/min/{1.73_m2} — ABNORMAL LOW (ref >59)
GFR, EST NON AFRICAN AMERICAN: 28 mL/min/{1.73_m2} — AB (ref >59)
GLUCOSE: 210 mg/dL — AB (ref 65–99)
POTASSIUM: 4 mmol/L (ref 3.5–5.2)
Sodium: 141 mmol/L (ref 134–144)

## 2014-09-10 ENCOUNTER — Encounter: Payer: Self-pay | Admitting: Emergency Medicine

## 2014-09-10 ENCOUNTER — Other Ambulatory Visit: Payer: Self-pay

## 2014-09-10 ENCOUNTER — Emergency Department: Payer: No Typology Code available for payment source

## 2014-09-10 ENCOUNTER — Emergency Department
Admission: EM | Admit: 2014-09-10 | Discharge: 2014-09-10 | Disposition: A | Discharge status: Home / Self Care | Payer: No Typology Code available for payment source | Attending: Emergency Medicine | Admitting: Emergency Medicine

## 2014-09-10 DIAGNOSIS — N183 Chronic kidney disease, stage 3 (moderate): Secondary | ICD-10-CM | POA: Insufficient documentation

## 2014-09-10 DIAGNOSIS — Z79899 Other long term (current) drug therapy: Secondary | ICD-10-CM | POA: Insufficient documentation

## 2014-09-10 DIAGNOSIS — R0789 Other chest pain: Secondary | ICD-10-CM | POA: Diagnosis present

## 2014-09-10 DIAGNOSIS — E119 Type 2 diabetes mellitus without complications: Secondary | ICD-10-CM | POA: Diagnosis not present

## 2014-09-10 DIAGNOSIS — Z79891 Long term (current) use of opiate analgesic: Secondary | ICD-10-CM | POA: Insufficient documentation

## 2014-09-10 DIAGNOSIS — I129 Hypertensive chronic kidney disease with stage 1 through stage 4 chronic kidney disease, or unspecified chronic kidney disease: Secondary | ICD-10-CM | POA: Insufficient documentation

## 2014-09-10 DIAGNOSIS — M549 Dorsalgia, unspecified: Secondary | ICD-10-CM | POA: Insufficient documentation

## 2014-09-10 DIAGNOSIS — Z720 Tobacco use: Secondary | ICD-10-CM | POA: Diagnosis not present

## 2014-09-10 DIAGNOSIS — G8929 Other chronic pain: Secondary | ICD-10-CM | POA: Diagnosis not present

## 2014-09-10 DIAGNOSIS — R0602 Shortness of breath: Secondary | ICD-10-CM | POA: Insufficient documentation

## 2014-09-10 LAB — CBC
HEMATOCRIT: 34.8 % — AB (ref 35.0–47.0)
Hemoglobin: 11.3 g/dL — ABNORMAL LOW (ref 12.0–16.0)
MCH: 29 pg (ref 26.0–34.0)
MCHC: 32.6 g/dL (ref 32.0–36.0)
MCV: 89 fL (ref 80.0–100.0)
Platelets: 226 10*3/uL (ref 150–440)
RBC: 3.91 MIL/uL (ref 3.80–5.20)
RDW: 19.7 % — AB (ref 11.5–14.5)
WBC: 20.9 10*3/uL — ABNORMAL HIGH (ref 3.6–11.0)

## 2014-09-10 LAB — BASIC METABOLIC PANEL
Anion gap: 13 (ref 5–15)
BUN: 52 mg/dL — ABNORMAL HIGH (ref 6–20)
CO2: 31 mmol/L (ref 22–32)
Calcium: 8.4 mg/dL — ABNORMAL LOW (ref 8.9–10.3)
Chloride: 96 mmol/L — ABNORMAL LOW (ref 101–111)
Creatinine, Ser: 1.96 mg/dL — ABNORMAL HIGH (ref 0.44–1.00)
GFR calc Af Amer: 32 mL/min — ABNORMAL LOW (ref >60)
GFR calc non Af Amer: 28 mL/min — ABNORMAL LOW (ref >60)
GLUCOSE: 211 mg/dL — AB (ref 65–99)
POTASSIUM: 3.4 mmol/L — AB (ref 3.5–5.1)
Sodium: 140 mmol/L (ref 135–145)

## 2014-09-10 LAB — TROPONIN I: Troponin I: <0.03 ng/mL (ref <0.031)

## 2014-09-10 MED ORDER — MORPHINE SULFATE 4 MG/ML IJ SOLN
4.0000 mg | Freq: Once | INTRAMUSCULAR | Status: AC
  Administered 2014-09-10: 4 mg via INTRAVENOUS

## 2014-09-10 MED ORDER — LORAZEPAM 2 MG/ML IJ SOLN
1.0000 mg | Freq: Once | INTRAMUSCULAR | Status: AC
  Administered 2014-09-10: 1 mg via INTRAVENOUS

## 2014-09-10 MED ORDER — PREDNISONE 20 MG PO TABS
ORAL_TABLET | ORAL | Status: AC
  Filled 2014-09-10: qty 3

## 2014-09-10 MED ORDER — MORPHINE SULFATE 4 MG/ML IJ SOLN
INTRAMUSCULAR | Status: AC
  Administered 2014-09-10: 4 mg via INTRAVENOUS
  Filled 2014-09-10: qty 1

## 2014-09-10 MED ORDER — FENTANYL CITRATE (PF) 100 MCG/2ML IJ SOLN
INTRAMUSCULAR | Status: AC
  Filled 2014-09-10: qty 2

## 2014-09-10 MED ORDER — HYDROMORPHONE HCL 1 MG/ML IJ SOLN
1.0000 mg | Freq: Once | INTRAMUSCULAR | Status: AC
  Administered 2014-09-10: 1 mg via INTRAVENOUS

## 2014-09-10 MED ORDER — LORAZEPAM 2 MG/ML IJ SOLN
INTRAMUSCULAR | Status: AC
  Filled 2014-09-10: qty 1

## 2014-09-10 MED ORDER — HYDROMORPHONE HCL 1 MG/ML IJ SOLN
INTRAMUSCULAR | Status: AC
  Filled 2014-09-10: qty 1

## 2014-09-10 MED ORDER — IPRATROPIUM-ALBUTEROL 0.5-2.5 (3) MG/3ML IN SOLN
RESPIRATORY_TRACT | Status: AC
  Filled 2014-09-10: qty 3

## 2014-09-10 MED ORDER — FENTANYL CITRATE (PF) 100 MCG/2ML IJ SOLN
100.0000 ug | Freq: Once | INTRAMUSCULAR | Status: AC
  Administered 2014-09-10: 100 ug via INTRAVENOUS

## 2014-09-10 MED ORDER — ONDANSETRON HCL 4 MG/2ML IJ SOLN: 4.0000 mg | Freq: Once | INTRAMUSCULAR | Status: DC

## 2014-09-10 NOTE — ED Notes (Signed)
Patient not scanned for pain medication, as medication was given in MRI department by RN.
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
## 2014-09-10 NOTE — ED Notes (Signed)
Patient presents to the ED with chest pain and central back pain since yesterday evening.  Patient also reports shortness of breath.    Patient reports history of an aortic tear.  Patient speaking in full sentences.

## 2014-09-10 NOTE — Discharge Instructions (Signed)
No certain cause was found for your back pain today, and I suspect it is due to acute exacerbation of her chronic pain. Return to emergency department immediately for any new or worsening chest pain, back pain, trouble breathing, shortness breath, fever, weakness, numbness, or any other symptoms concerning to you.

## 2014-09-10 NOTE — ED Notes (Signed)
Patient transported to MRI 

## 2014-09-10 NOTE — ED Notes (Signed)
Patient instructed per Dr. Mayford Knife that she is to remain NPO until results are back for MRI d/t possibility for surgery. Patient acknowledged, but is now in room drinking beverage brought by family.

## 2014-09-10 NOTE — ED Notes (Signed)
Patient and family with no complaints at this time. Respirations even and unlabored. Skin warm/dry. Discharge instructions reviewed with patient and family at this time. Patient given opportunity to voice concerns/ask questions. IV removed per policy and band-aid applied to site. Patient discharged at this time and left Emergency Department, via wheelchair.

## 2014-09-10 NOTE — ED Notes (Signed)
Pt brought back from MRI.  Unable to tolerate scan due to pain while laying flat.

## 2014-09-11 ENCOUNTER — Other Ambulatory Visit (INDEPENDENT_AMBULATORY_CARE_PROVIDER_SITE_OTHER): Payer: No Typology Code available for payment source | Admitting: *Deleted

## 2014-09-11 DIAGNOSIS — R079 Chest pain, unspecified: Secondary | ICD-10-CM

## 2014-09-11 DIAGNOSIS — R0602 Shortness of breath: Secondary | ICD-10-CM | POA: Diagnosis not present

## 2014-09-12 ENCOUNTER — Ambulatory Visit (INDEPENDENT_AMBULATORY_CARE_PROVIDER_SITE_OTHER): Payer: No Typology Code available for payment source | Admitting: Thoracic Surgery (Cardiothoracic Vascular Surgery)

## 2014-09-12 ENCOUNTER — Encounter: Payer: Self-pay | Admitting: Thoracic Surgery (Cardiothoracic Vascular Surgery)

## 2014-09-12 VITALS — BP 156/90 | HR 100 | Resp 20 | Ht 64.0 in | Wt 182.0 lb

## 2014-09-12 DIAGNOSIS — I7100 Dissection of unspecified site of aorta: Secondary | ICD-10-CM | POA: Diagnosis not present

## 2014-09-12 LAB — BASIC METABOLIC PANEL
BUN/Creatinine Ratio: 29 — ABNORMAL HIGH (ref 9–23)
BUN: 55 mg/dL — ABNORMAL HIGH (ref 6–24)
CALCIUM: 7.9 mg/dL — AB (ref 8.7–10.2)
CO2: 24 mmol/L (ref 18–29)
CREATININE: 1.87 mg/dL — AB (ref 0.57–1.00)
Chloride: 96 mmol/L — ABNORMAL LOW (ref 97–108)
GFR calc Af Amer: 34 mL/min/{1.73_m2} — ABNORMAL LOW (ref >59)
GFR calc non Af Amer: 30 mL/min/{1.73_m2} — ABNORMAL LOW (ref >59)
Glucose: 221 mg/dL — ABNORMAL HIGH (ref 65–99)
Potassium: 3.9 mmol/L (ref 3.5–5.2)
Sodium: 143 mmol/L (ref 134–144)
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
## 2014-09-13 ENCOUNTER — Telehealth: Payer: Self-pay

## 2014-09-13 NOTE — Telephone Encounter (Signed)
Pharmacist has a question the Potassium rx that was sent in. Please call.

## 2014-09-13 NOTE — Telephone Encounter (Signed)
Spoke w/ Christian @ pt's pharmacy.  Clarified that [employer] increased pt's K+ at last ov.  He verbalizes understanding and will call back w/ any questions or concerns.

## 2014-09-14 ENCOUNTER — Other Ambulatory Visit: Payer: Self-pay | Admitting: Orthopedic Surgery

## 2014-09-14 DIAGNOSIS — S22060A Wedge compression fracture of T7-T8 vertebra, initial encounter for closed fracture: Secondary | ICD-10-CM

## 2014-09-15 ENCOUNTER — Encounter
Admission: RE | Admit: 2014-09-15 | Discharge: 2014-09-15 | Disposition: A | Discharge status: Home / Self Care | Payer: No Typology Code available for payment source | Source: Ambulatory Visit | Attending: Orthopedic Surgery | Admitting: Orthopedic Surgery

## 2014-09-15 ENCOUNTER — Telehealth: Payer: Self-pay | Admitting: *Deleted

## 2014-09-15 DIAGNOSIS — X58XXXA Exposure to other specified factors, initial encounter: Secondary | ICD-10-CM | POA: Diagnosis not present

## 2014-09-15 DIAGNOSIS — S22060A Wedge compression fracture of T7-T8 vertebra, initial encounter for closed fracture: Secondary | ICD-10-CM | POA: Insufficient documentation

## 2014-09-15 MED ORDER — TECHNETIUM TC 99M MEDRONATE IV KIT
25.0000 | PACK | Freq: Once | INTRAVENOUS | Status: AC | PRN
  Administered 2014-09-15: 23.28 via INTRAVENOUS

## 2014-09-15 NOTE — Telephone Encounter (Signed)
Pt is calling stating that she was in the ED this past week  For back pain and found out that she has 3 broken vertebrae And now is told she will need surgery. She can't even schedule anything until we clear her for this.  She said she went to her [facility] clinic doctor, (did not remember the name) And he was going to need the clearance.

## 2014-09-18 NOTE — Telephone Encounter (Signed)
This encounter was created in error - please disregard.

## 2014-09-18 NOTE — Telephone Encounter (Signed)
I spoke with the patient and her caregiver. She does have 3 broken vertebrae in her back and does need surgery [facility]). She was seen in our office on 6/17 by Eula Listen, PA instructed her to increase her torsemide to 60 mg in the AM & 40 mg in the PM x 2 days, then decrease to 40 mg BID x 2 days, then decrease to 20 mg BID. She reports that over the weekend, she took torsemide 60 mg in the AM & 40 mg in the PM. She took one metolazone 5 mg tablet with each dose of torsemide. She reports today that her weight is up 12 lbs from Friday, although she cannot give me the actual numbers.  Per the caregiver, she reports she brought her to the office last week and her legs are 3 x more swollen than when she was in the office. She has not slept well in the last 10 days, but this is partially due to her back pain. She has not eaten more than a few pieces of fruit over the weekend. I advised I would review with Dr. Mariah Milling and call her back. She is agreeable.

## 2014-09-18 NOTE — Telephone Encounter (Signed)
Pt called back, states her legs and feet are "blood red, and swollen 3 times the size"  Pt c/o of Chest Pain: STAT if CP now or developed within 24 hours  1. Are you having CP right now? yes  2. Are you experiencing any other symptoms (ex. SOB, nausea, vomiting, sweating)? no  3. How long have you been experiencing CP? A day and a half  4. Is your CP continuous or coming and going? Comes and goes  5. Have you taken Nitroglycerin? no ?

## 2014-09-18 NOTE — Telephone Encounter (Signed)
Reviewed the patient's symptoms with Dr. Mariah Milling.  He is concerned about her renal function at this point- (6/20)- BUN/ creat- 55/1.8, (6/19)- 52/1.96, (5/26)- 49/2.17. Per Dr. Mariah Milling- the patient's lower extremity edema may be related to venous insufficiency. We could possibly refer to [facility].  Wear support hose and confirm she is not taking amlodipine. I also called the surgeon's office (Dr. Rosita Kea, at [facility]) to find out what type of surgery the patient needs to have and how "emergent" is this. Per Hope at Dr. Rosita Kea office, the patient had a scan done Friday and she reviewed this while I was on the phone with her. She reports there is no new fracture noted, therefore no surgery will be needed this week. I inquired if they will contact the patient about this and she confirmed they would.  I called the patient back and confirmed she is off amlodipine. I advised her that Dr. Mariah Milling is concerned about her renal function and preservation of her kidneys.  She may stay on elevated dose of torsemide short term per Dr. Mariah Milling, but doing this for a lengthy period of time may do further harm to her kidneys. I advised that she wear support hose- she states her legs are red and swollen and she will not put these on. Her caregiver got on the phone and stated she tried to put large socks on her this morning and she was in pain with this. I explained we could refer her to [facility], but she did not agree to this at this time. She states she has pain in her back and her legs are swollen and that no one wanted to help her. I apologized for her feeling this way, but explained her symptoms do not seem to be heart failure related. I also explained that I contacted the surgeon's office to see how "emergent" her surgery was and that I was told there was no new fracture, so no surgery this week. I also advised I could not give more details on the treatment for her back pain, that  she would need to speak with the surgeon's office. I also advised she could follow up with her PCP due to her multiple medical issues at this time. She was agreeable with doing this.

## 2014-09-25 DIAGNOSIS — N183 Chronic kidney disease, stage 3 unspecified: Secondary | ICD-10-CM | POA: Insufficient documentation

## 2014-09-25 DIAGNOSIS — I469 Cardiac arrest, cause unspecified: Secondary | ICD-10-CM | POA: Insufficient documentation

## 2014-09-25 DIAGNOSIS — F1120 Opioid dependence, uncomplicated: Secondary | ICD-10-CM | POA: Insufficient documentation

## 2014-09-25 DIAGNOSIS — IMO0002 Reserved for concepts with insufficient information to code with codable children: Secondary | ICD-10-CM | POA: Insufficient documentation

## 2014-09-25 DIAGNOSIS — E669 Obesity, unspecified: Secondary | ICD-10-CM | POA: Insufficient documentation

## 2014-09-25 DIAGNOSIS — E7800 Pure hypercholesterolemia, unspecified: Secondary | ICD-10-CM | POA: Insufficient documentation

## 2014-09-25 DIAGNOSIS — I5022 Chronic systolic (congestive) heart failure: Secondary | ICD-10-CM | POA: Insufficient documentation

## 2014-09-25 DIAGNOSIS — D72810 Lymphocytopenia: Secondary | ICD-10-CM | POA: Insufficient documentation

## 2014-09-25 DIAGNOSIS — M546 Pain in thoracic spine: Secondary | ICD-10-CM | POA: Insufficient documentation

## 2014-09-25 DIAGNOSIS — E119 Type 2 diabetes mellitus without complications: Secondary | ICD-10-CM | POA: Insufficient documentation

## 2014-09-25 DIAGNOSIS — R140 Abdominal distension (gaseous): Secondary | ICD-10-CM | POA: Insufficient documentation

## 2014-09-26 DIAGNOSIS — Z720 Tobacco use: Secondary | ICD-10-CM | POA: Insufficient documentation

## 2014-09-26 DIAGNOSIS — F17200 Nicotine dependence, unspecified, uncomplicated: Secondary | ICD-10-CM | POA: Insufficient documentation

## 2014-10-03 ENCOUNTER — Ambulatory Visit (INDEPENDENT_AMBULATORY_CARE_PROVIDER_SITE_OTHER): Payer: No Typology Code available for payment source

## 2014-10-03 ENCOUNTER — Other Ambulatory Visit: Payer: Self-pay

## 2014-10-03 DIAGNOSIS — R002 Palpitations: Secondary | ICD-10-CM

## 2014-10-05 ENCOUNTER — Ambulatory Visit: Payer: Self-pay | Admitting: Family Medicine

## 2014-10-11 ENCOUNTER — Encounter: Payer: Self-pay | Admitting: Family Medicine

## 2014-10-11 ENCOUNTER — Ambulatory Visit (INDEPENDENT_AMBULATORY_CARE_PROVIDER_SITE_OTHER): Payer: No Typology Code available for payment source | Admitting: Family Medicine

## 2014-10-11 ENCOUNTER — Telehealth: Payer: Self-pay | Admitting: Family Medicine

## 2014-10-11 VITALS — BP 120/84 | HR 89 | Temp 98.0°F | Resp 18 | Ht 64.0 in | Wt 169.8 lb

## 2014-10-11 DIAGNOSIS — I110 Hypertensive heart disease with heart failure: Secondary | ICD-10-CM | POA: Diagnosis not present

## 2014-10-11 DIAGNOSIS — N183 Chronic kidney disease, stage 3 unspecified: Secondary | ICD-10-CM

## 2014-10-11 DIAGNOSIS — I5043 Acute on chronic combined systolic (congestive) and diastolic (congestive) heart failure: Secondary | ICD-10-CM

## 2014-10-11 DIAGNOSIS — I7100 Dissection of unspecified site of aorta: Secondary | ICD-10-CM

## 2014-10-11 DIAGNOSIS — F1920 Other psychoactive substance dependence, uncomplicated: Secondary | ICD-10-CM

## 2014-10-11 DIAGNOSIS — E1122 Type 2 diabetes mellitus with diabetic chronic kidney disease: Secondary | ICD-10-CM | POA: Diagnosis not present

## 2014-10-11 DIAGNOSIS — I7090 Unspecified atherosclerosis: Secondary | ICD-10-CM

## 2014-10-11 DIAGNOSIS — M5440 Lumbago with sciatica, unspecified side: Secondary | ICD-10-CM | POA: Diagnosis not present

## 2014-10-11 DIAGNOSIS — K7689 Other specified diseases of liver: Secondary | ICD-10-CM

## 2014-10-11 DIAGNOSIS — K769 Liver disease, unspecified: Secondary | ICD-10-CM

## 2014-10-11 DIAGNOSIS — F1120 Opioid dependence, uncomplicated: Secondary | ICD-10-CM

## 2014-10-11 DIAGNOSIS — G8929 Other chronic pain: Secondary | ICD-10-CM

## 2014-10-11 DIAGNOSIS — I422 Other hypertrophic cardiomyopathy: Secondary | ICD-10-CM

## 2014-10-11 DIAGNOSIS — E669 Obesity, unspecified: Secondary | ICD-10-CM | POA: Diagnosis not present

## 2014-10-11 DIAGNOSIS — N189 Chronic kidney disease, unspecified: Secondary | ICD-10-CM

## 2014-10-11 DIAGNOSIS — N179 Acute kidney failure, unspecified: Secondary | ICD-10-CM | POA: Diagnosis not present

## 2014-10-11 MED ORDER — OXYCODONE HCL 20 MG PO TABS: 20.0000 mg | ORAL_TABLET | Freq: Two times a day (BID) | ORAL | Status: DC

## 2014-10-11 MED ORDER — OXYCODONE HCL 20 MG PO TABS: 20.0000 mg | ORAL_TABLET | Freq: Three times a day (TID) | ORAL | Status: DC

## 2014-10-11 MED ORDER — MORPHINE SULFATE ER 15 MG PO TBCR: 15.0000 mg | EXTENDED_RELEASE_TABLET | Freq: Two times a day (BID) | ORAL | Status: DC

## 2014-10-11 NOTE — Telephone Encounter (Signed)
Pt is coming in tomorrow for hospital follow up, but she is requesting a lab order to get her creatine checked

## 2014-10-11 NOTE — Telephone Encounter (Signed)
Pt informed to come in today before 11:00 a.m. to be seen
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
## 2014-10-12 ENCOUNTER — Encounter: Payer: Self-pay | Admitting: Family Medicine

## 2014-10-12 ENCOUNTER — Ambulatory Visit: Payer: Self-pay | Admitting: Family Medicine

## 2014-10-12 ENCOUNTER — Other Ambulatory Visit
Admission: RE | Admit: 2014-10-12 | Discharge: 2014-10-12 | Disposition: A | Discharge status: Home / Self Care | Payer: No Typology Code available for payment source | Source: Ambulatory Visit | Attending: Family Medicine | Admitting: Family Medicine

## 2014-10-12 DIAGNOSIS — G8929 Other chronic pain: Secondary | ICD-10-CM | POA: Diagnosis not present

## 2014-10-12 DIAGNOSIS — J449 Chronic obstructive pulmonary disease, unspecified: Secondary | ICD-10-CM | POA: Diagnosis present

## 2014-10-12 DIAGNOSIS — N189 Chronic kidney disease, unspecified: Secondary | ICD-10-CM | POA: Diagnosis present

## 2014-10-12 DIAGNOSIS — I509 Heart failure, unspecified: Secondary | ICD-10-CM | POA: Insufficient documentation

## 2014-10-12 DIAGNOSIS — I10 Essential (primary) hypertension: Secondary | ICD-10-CM | POA: Insufficient documentation

## 2014-10-12 LAB — COMPREHENSIVE METABOLIC PANEL
ALBUMIN: 2.9 g/dL — AB (ref 3.5–5.0)
ALK PHOS: 96 U/L (ref 38–126)
ALT: 13 U/L — AB (ref 14–54)
AST: 24 U/L (ref 15–41)
Anion gap: 15 (ref 5–15)
BUN: 44 mg/dL — AB (ref 6–20)
CALCIUM: 8.2 mg/dL — AB (ref 8.9–10.3)
CO2: 22 mmol/L (ref 22–32)
CREATININE: 6.89 mg/dL — AB (ref 0.44–1.00)
Chloride: 98 mmol/L — ABNORMAL LOW (ref 101–111)
GFR calc non Af Amer: 6 mL/min — ABNORMAL LOW (ref >60)
GFR, EST AFRICAN AMERICAN: 7 mL/min — AB (ref >60)
Glucose, Bld: 116 mg/dL — ABNORMAL HIGH (ref 65–99)
POTASSIUM: 4 mmol/L (ref 3.5–5.1)
SODIUM: 135 mmol/L (ref 135–145)
TOTAL PROTEIN: 6.3 g/dL — AB (ref 6.5–8.1)
Total Bilirubin: 0.4 mg/dL (ref 0.3–1.2)

## 2014-10-16 ENCOUNTER — Telehealth: Payer: Self-pay | Admitting: Emergency Medicine

## 2014-10-16 DIAGNOSIS — N19 Unspecified kidney failure: Secondary | ICD-10-CM

## 2014-10-16 NOTE — Telephone Encounter (Signed)
Sent letter to patient address. Due to voice mail stating full and cannot leave a message for her to call the office.

## 2014-10-18 ENCOUNTER — Inpatient Hospital Stay: Payer: No Typology Code available for payment source

## 2014-10-18 ENCOUNTER — Inpatient Hospital Stay
Admission: AD | Admit: 2014-10-18 | Discharge: 2014-10-21 | DRG: 682 | Disposition: A | Discharge status: Home / Self Care | Payer: No Typology Code available for payment source | Source: Ambulatory Visit | Attending: Internal Medicine | Admitting: Internal Medicine

## 2014-10-18 ENCOUNTER — Encounter: Payer: Self-pay | Admitting: Internal Medicine

## 2014-10-18 DIAGNOSIS — Z8674 Personal history of sudden cardiac arrest: Secondary | ICD-10-CM | POA: Diagnosis not present

## 2014-10-18 DIAGNOSIS — F1120 Opioid dependence, uncomplicated: Secondary | ICD-10-CM | POA: Diagnosis present

## 2014-10-18 DIAGNOSIS — R0602 Shortness of breath: Secondary | ICD-10-CM

## 2014-10-18 DIAGNOSIS — Z716 Tobacco abuse counseling: Secondary | ICD-10-CM | POA: Diagnosis not present

## 2014-10-18 DIAGNOSIS — N170 Acute kidney failure with tubular necrosis: Principal | ICD-10-CM | POA: Diagnosis present

## 2014-10-18 DIAGNOSIS — J449 Chronic obstructive pulmonary disease, unspecified: Secondary | ICD-10-CM | POA: Diagnosis present

## 2014-10-18 DIAGNOSIS — T368X5A Adverse effect of other systemic antibiotics, initial encounter: Secondary | ICD-10-CM | POA: Diagnosis present

## 2014-10-18 DIAGNOSIS — M549 Dorsalgia, unspecified: Secondary | ICD-10-CM | POA: Diagnosis present

## 2014-10-18 DIAGNOSIS — Z8701 Personal history of pneumonia (recurrent): Secondary | ICD-10-CM | POA: Diagnosis not present

## 2014-10-18 DIAGNOSIS — I509 Heart failure, unspecified: Secondary | ICD-10-CM | POA: Diagnosis not present

## 2014-10-18 DIAGNOSIS — Z794 Long term (current) use of insulin: Secondary | ICD-10-CM | POA: Diagnosis not present

## 2014-10-18 DIAGNOSIS — I130 Hypertensive heart and chronic kidney disease with heart failure and stage 1 through stage 4 chronic kidney disease, or unspecified chronic kidney disease: Secondary | ICD-10-CM | POA: Diagnosis present

## 2014-10-18 DIAGNOSIS — N179 Acute kidney failure, unspecified: Secondary | ICD-10-CM | POA: Diagnosis present

## 2014-10-18 DIAGNOSIS — I5033 Acute on chronic diastolic (congestive) heart failure: Secondary | ICD-10-CM | POA: Diagnosis present

## 2014-10-18 DIAGNOSIS — I429 Cardiomyopathy, unspecified: Secondary | ICD-10-CM | POA: Diagnosis present

## 2014-10-18 DIAGNOSIS — E780 Pure hypercholesterolemia: Secondary | ICD-10-CM | POA: Diagnosis present

## 2014-10-18 DIAGNOSIS — M5417 Radiculopathy, lumbosacral region: Secondary | ICD-10-CM | POA: Diagnosis present

## 2014-10-18 DIAGNOSIS — Z833 Family history of diabetes mellitus: Secondary | ICD-10-CM | POA: Diagnosis not present

## 2014-10-18 DIAGNOSIS — Z6827 Body mass index (BMI) 27.0-27.9, adult: Secondary | ICD-10-CM | POA: Diagnosis not present

## 2014-10-18 DIAGNOSIS — K5900 Constipation, unspecified: Secondary | ICD-10-CM | POA: Diagnosis present

## 2014-10-18 DIAGNOSIS — F17210 Nicotine dependence, cigarettes, uncomplicated: Secondary | ICD-10-CM | POA: Diagnosis present

## 2014-10-18 DIAGNOSIS — N183 Chronic kidney disease, stage 3 (moderate): Secondary | ICD-10-CM | POA: Diagnosis present

## 2014-10-18 DIAGNOSIS — Z800 Family history of malignant neoplasm of digestive organs: Secondary | ICD-10-CM | POA: Diagnosis not present

## 2014-10-18 DIAGNOSIS — Z8249 Family history of ischemic heart disease and other diseases of the circulatory system: Secondary | ICD-10-CM | POA: Diagnosis not present

## 2014-10-18 DIAGNOSIS — Z9049 Acquired absence of other specified parts of digestive tract: Secondary | ICD-10-CM | POA: Diagnosis present

## 2014-10-18 DIAGNOSIS — T502X5A Adverse effect of carbonic-anhydrase inhibitors, benzothiadiazides and other diuretics, initial encounter: Secondary | ICD-10-CM | POA: Diagnosis present

## 2014-10-18 DIAGNOSIS — R609 Edema, unspecified: Secondary | ICD-10-CM

## 2014-10-18 DIAGNOSIS — E876 Hypokalemia: Secondary | ICD-10-CM | POA: Diagnosis present

## 2014-10-18 DIAGNOSIS — G8929 Other chronic pain: Secondary | ICD-10-CM | POA: Diagnosis present

## 2014-10-18 DIAGNOSIS — E1122 Type 2 diabetes mellitus with diabetic chronic kidney disease: Secondary | ICD-10-CM | POA: Diagnosis present

## 2014-10-18 LAB — CBC
HCT: 30.5 % — ABNORMAL LOW (ref 35.0–47.0)
Hemoglobin: 10 g/dL — ABNORMAL LOW (ref 12.0–16.0)
MCH: 28.6 pg (ref 26.0–34.0)
MCHC: 32.6 g/dL (ref 32.0–36.0)
MCV: 87.5 fL (ref 80.0–100.0)
Platelets: 496 10*3/uL — ABNORMAL HIGH (ref 150–440)
RBC: 3.49 MIL/uL — ABNORMAL LOW (ref 3.80–5.20)
RDW: 16.9 % — AB (ref 11.5–14.5)
WBC: 9.1 10*3/uL (ref 3.6–11.0)

## 2014-10-18 LAB — URINALYSIS COMPLETE WITH MICROSCOPIC (ARMC ONLY)
Bacteria, UA: NONE SEEN
Bilirubin Urine: NEGATIVE
Glucose, UA: NEGATIVE mg/dL
HGB URINE DIPSTICK: NEGATIVE
KETONES UR: NEGATIVE mg/dL
Nitrite: NEGATIVE
PH: 5 (ref 5.0–8.0)
Protein, ur: NEGATIVE mg/dL
SPECIFIC GRAVITY, URINE: 1.006 (ref 1.005–1.030)

## 2014-10-18 LAB — PROTIME-INR
INR: 1.12
Prothrombin Time: 14.6 seconds (ref 11.4–15.0)

## 2014-10-18 LAB — POTASSIUM: Potassium: 3 mmol/L — ABNORMAL LOW (ref 3.5–5.1)

## 2014-10-18 LAB — COMPREHENSIVE METABOLIC PANEL
ALT: 10 U/L — ABNORMAL LOW (ref 14–54)
AST: 23 U/L (ref 15–41)
Albumin: 2.6 g/dL — ABNORMAL LOW (ref 3.5–5.0)
Alkaline Phosphatase: 97 U/L (ref 38–126)
Anion gap: 14 (ref 5–15)
BILIRUBIN TOTAL: 0.5 mg/dL (ref 0.3–1.2)
BUN: 33 mg/dL — AB (ref 6–20)
CALCIUM: 8.1 mg/dL — AB (ref 8.9–10.3)
CHLORIDE: 94 mmol/L — AB (ref 101–111)
CO2: 27 mmol/L (ref 22–32)
CREATININE: 3.36 mg/dL — AB (ref 0.44–1.00)
GFR calc Af Amer: 17 mL/min — ABNORMAL LOW (ref >60)
GFR, EST NON AFRICAN AMERICAN: 14 mL/min — AB (ref >60)
Glucose, Bld: 138 mg/dL — ABNORMAL HIGH (ref 65–99)
Potassium: 2.6 mmol/L — CL (ref 3.5–5.1)
SODIUM: 135 mmol/L (ref 135–145)
Total Protein: 6.2 g/dL — ABNORMAL LOW (ref 6.5–8.1)

## 2014-10-18 LAB — HEMOGLOBIN A1C: HEMOGLOBIN A1C: 6.2 % — AB (ref 4.0–6.0)

## 2014-10-18 LAB — TSH: TSH: 0.587 u[IU]/mL (ref 0.350–4.500)

## 2014-10-18 LAB — MAGNESIUM: Magnesium: 1.6 mg/dL — ABNORMAL LOW (ref 1.7–2.4)

## 2014-10-18 LAB — GLUCOSE, CAPILLARY
Glucose-Capillary: 103 mg/dL — ABNORMAL HIGH (ref 65–99)
Glucose-Capillary: 111 mg/dL — ABNORMAL HIGH (ref 65–99)

## 2014-10-18 MED ORDER — BISACODYL 5 MG PO TBEC: 5.0000 mg | DELAYED_RELEASE_TABLET | Freq: Every day | ORAL | Status: DC | PRN

## 2014-10-18 MED ORDER — INSULIN ASPART 100 UNIT/ML ~~LOC~~ SOLN: 0.0000 [IU] | Freq: Every day | SUBCUTANEOUS | Status: DC

## 2014-10-18 MED ORDER — MORPHINE SULFATE ER 15 MG PO TBCR
15.0000 mg | EXTENDED_RELEASE_TABLET | Freq: Two times a day (BID) | ORAL | Status: DC
  Administered 2014-10-18 – 2014-10-21 (×7): 15 mg via ORAL
  Filled 2014-10-18 (×7): qty 1

## 2014-10-18 MED ORDER — FAMOTIDINE 20 MG PO TABS
20.0000 mg | ORAL_TABLET | Freq: Two times a day (BID) | ORAL | Status: DC
  Administered 2014-10-18 – 2014-10-19 (×3): 20 mg via ORAL
  Filled 2014-10-18 (×3): qty 1

## 2014-10-18 MED ORDER — HEPARIN SODIUM (PORCINE) 5000 UNIT/ML IJ SOLN
5000.0000 [IU] | Freq: Three times a day (TID) | INTRAMUSCULAR | Status: DC
Start: 2014-10-18 — End: 2014-10-21
  Administered 2014-10-18 – 2014-10-20 (×5): 5000 [IU] via SUBCUTANEOUS
  Filled 2014-10-18 (×5): qty 1

## 2014-10-18 MED ORDER — ACETAMINOPHEN 325 MG PO TABS: 650.0000 mg | ORAL_TABLET | Freq: Four times a day (QID) | ORAL | Status: DC | PRN

## 2014-10-18 MED ORDER — ONDANSETRON HCL 4 MG/2ML IJ SOLN: 4.0000 mg | Freq: Four times a day (QID) | INTRAMUSCULAR | Status: DC | PRN

## 2014-10-18 MED ORDER — SODIUM CHLORIDE 0.9 % IJ SOLN
3.0000 mL | Freq: Two times a day (BID) | INTRAMUSCULAR | Status: DC
  Administered 2014-10-19 – 2014-10-20 (×2): 3 mL via INTRAVENOUS

## 2014-10-18 MED ORDER — ACETAMINOPHEN 650 MG RE SUPP: 650.0000 mg | Freq: Four times a day (QID) | RECTAL | Status: DC | PRN

## 2014-10-18 MED ORDER — ASPIRIN EC 81 MG PO TBEC
81.0000 mg | DELAYED_RELEASE_TABLET | Freq: Every day | ORAL | Status: DC
Start: 2014-10-18 — End: 2014-10-21
  Administered 2014-10-18 – 2014-10-21 (×4): 81 mg via ORAL
  Filled 2014-10-18 (×4): qty 1

## 2014-10-18 MED ORDER — OXYCODONE HCL 5 MG PO TABS
20.0000 mg | ORAL_TABLET | Freq: Three times a day (TID) | ORAL | Status: DC | PRN
  Administered 2014-10-18 – 2014-10-21 (×7): 20 mg via ORAL
  Filled 2014-10-18 (×7): qty 4

## 2014-10-18 MED ORDER — POTASSIUM CHLORIDE 10 MEQ/100ML IV SOLN
10.0000 meq | INTRAVENOUS | Status: DC
  Filled 2014-10-18 (×4): qty 100

## 2014-10-18 MED ORDER — POTASSIUM CHLORIDE 10 MEQ/100ML IV SOLN
10.0000 meq | INTRAVENOUS | Status: AC
  Administered 2014-10-18: 10 meq via INTRAVENOUS
  Filled 2014-10-18 (×2): qty 100

## 2014-10-18 MED ORDER — POTASSIUM CHLORIDE CRYS ER 20 MEQ PO TBCR
40.0000 meq | EXTENDED_RELEASE_TABLET | ORAL | Status: AC
  Administered 2014-10-18 (×2): 40 meq via ORAL
  Filled 2014-10-18 (×2): qty 2

## 2014-10-18 MED ORDER — CYCLOBENZAPRINE HCL 10 MG PO TABS
10.0000 mg | ORAL_TABLET | Freq: Every day | ORAL | Status: DC | PRN
Start: 2014-10-18 — End: 2014-10-21
  Administered 2014-10-18 – 2014-10-19 (×2): 10 mg via ORAL
  Filled 2014-10-18 (×2): qty 1

## 2014-10-18 MED ORDER — LABETALOL HCL 200 MG PO TABS
300.0000 mg | ORAL_TABLET | Freq: Three times a day (TID) | ORAL | Status: DC
  Administered 2014-10-18 – 2014-10-21 (×8): 300 mg via ORAL
  Filled 2014-10-18 (×8): qty 2

## 2014-10-18 MED ORDER — INSULIN ASPART 100 UNIT/ML ~~LOC~~ SOLN
0.0000 [IU] | Freq: Three times a day (TID) | SUBCUTANEOUS | Status: DC
  Filled 2014-10-18: qty 2

## 2014-10-18 MED ORDER — DOXAZOSIN MESYLATE 4 MG PO TABS
8.0000 mg | ORAL_TABLET | Freq: Two times a day (BID) | ORAL | Status: DC
  Administered 2014-10-18 – 2014-10-21 (×6): 8 mg via ORAL
  Filled 2014-10-18 (×6): qty 2

## 2014-10-18 MED ORDER — MONTELUKAST SODIUM 10 MG PO TABS
10.0000 mg | ORAL_TABLET | Freq: Every day | ORAL | Status: DC
  Administered 2014-10-18 – 2014-10-20 (×3): 10 mg via ORAL
  Filled 2014-10-18 (×3): qty 1

## 2014-10-18 MED ORDER — LINACLOTIDE 145 MCG PO CAPS
145.0000 ug | ORAL_CAPSULE | Freq: Every day | ORAL | Status: DC
  Administered 2014-10-18 – 2014-10-21 (×4): 145 ug via ORAL
  Filled 2014-10-18 (×4): qty 1

## 2014-10-18 MED ORDER — NITROGLYCERIN 0.4 MG SL SUBL: 0.4000 mg | SUBLINGUAL_TABLET | SUBLINGUAL | Status: DC | PRN

## 2014-10-18 MED ORDER — ALBUTEROL SULFATE (2.5 MG/3ML) 0.083% IN NEBU: 2.5000 mg | INHALATION_SOLUTION | RESPIRATORY_TRACT | Status: DC | PRN

## 2014-10-18 MED ORDER — ONDANSETRON HCL 4 MG PO TABS
4.0000 mg | ORAL_TABLET | Freq: Four times a day (QID) | ORAL | Status: DC | PRN
  Administered 2014-10-20 – 2014-10-21 (×2): 4 mg via ORAL
  Filled 2014-10-18 (×2): qty 1

## 2014-10-18 NOTE — Progress Notes (Signed)
   10/18/14 2100  Clinical Encounter Type  Visited With Patient  Visit Type Spiritual support  Spiritual Encounters  Spiritual Needs Emotional  Stress Factors  Patient Stress Factors Health changes   Status: alert and oriented/eating dinner/melancholy Faith: [place] Family: none present but patient says son just left and he's supportive Age/Sex: female 66 Visit Assessment: The patient shared that she only has 1 son and that she is feeling better today and that the chaplains are welcome to visit as she ate her dinner. The chaplain offered encouraging words.  Pastoral care can be reached via pager [phone] or by submitting an online request.
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
## 2014-10-18 NOTE — Progress Notes (Signed)
Spoke with Dr Clint Guy regarding PM medication and pt's recent BP being low.   MD Instructed to hold BP meds for tonight.  Cristela Felt, RN

## 2014-10-18 NOTE — Progress Notes (Signed)
Spoke with Dr Auburn Bilberry regarding patient's code status.  Pt states that she does not want to be resuscitated.  MD to change code status. Cristela Felt, RN

## 2014-10-19 ENCOUNTER — Inpatient Hospital Stay (HOSPITAL_COMMUNITY)
Admission: AD | Admit: 2014-10-19 | Discharge: 2014-10-19 | Disposition: A | Discharge status: Home / Self Care | Payer: No Typology Code available for payment source | Source: Ambulatory Visit | Attending: Nephrology | Admitting: Nephrology

## 2014-10-19 ENCOUNTER — Inpatient Hospital Stay
Admission: AD | Admit: 2014-10-19 | Payer: No Typology Code available for payment source | Source: Ambulatory Visit | Admitting: Internal Medicine

## 2014-10-19 DIAGNOSIS — I509 Heart failure, unspecified: Secondary | ICD-10-CM

## 2014-10-19 LAB — PHOSPHORUS: Phosphorus: 3.5 mg/dL (ref 2.5–4.6)

## 2014-10-19 LAB — BASIC METABOLIC PANEL
ANION GAP: 11 (ref 5–15)
BUN: 34 mg/dL — ABNORMAL HIGH (ref 6–20)
CO2: 30 mmol/L (ref 22–32)
Calcium: 8.3 mg/dL — ABNORMAL LOW (ref 8.9–10.3)
Chloride: 100 mmol/L — ABNORMAL LOW (ref 101–111)
Creatinine, Ser: 3.6 mg/dL — ABNORMAL HIGH (ref 0.44–1.00)
GFR calc Af Amer: 15 mL/min — ABNORMAL LOW (ref >60)
GFR calc non Af Amer: 13 mL/min — ABNORMAL LOW (ref >60)
Glucose, Bld: 89 mg/dL (ref 65–99)
Potassium: 3.5 mmol/L (ref 3.5–5.1)
SODIUM: 141 mmol/L (ref 135–145)

## 2014-10-19 LAB — GLUCOSE, CAPILLARY
Glucose-Capillary: 126 mg/dL — ABNORMAL HIGH (ref 65–99)
Glucose-Capillary: 131 mg/dL — ABNORMAL HIGH (ref 65–99)
Glucose-Capillary: 106 mg/dL — ABNORMAL HIGH (ref 65–99)

## 2014-10-19 MED ORDER — POTASSIUM CHLORIDE CRYS ER 20 MEQ PO TBCR
20.0000 meq | EXTENDED_RELEASE_TABLET | Freq: Every day | ORAL | Status: DC
  Administered 2014-10-19 – 2014-10-21 (×3): 20 meq via ORAL
  Filled 2014-10-19 (×3): qty 1

## 2014-10-19 MED ORDER — FUROSEMIDE 10 MG/ML IJ SOLN
60.0000 mg | Freq: Once | INTRAMUSCULAR | Status: AC
  Administered 2014-10-19: 60 mg via INTRAVENOUS
  Filled 2014-10-19: qty 6

## 2014-10-19 MED ORDER — MAGNESIUM OXIDE 400 (241.3 MG) MG PO TABS
400.0000 mg | ORAL_TABLET | Freq: Every day | ORAL | Status: DC
  Administered 2014-10-19 – 2014-10-21 (×3): 400 mg via ORAL
  Filled 2014-10-19 (×3): qty 1

## 2014-10-19 MED ORDER — FAMOTIDINE 20 MG PO TABS
20.0000 mg | ORAL_TABLET | Freq: Every day | ORAL | Status: DC
  Administered 2014-10-20 – 2014-10-21 (×2): 20 mg via ORAL
  Filled 2014-10-19 (×2): qty 1

## 2014-10-19 NOTE — Addendum Note (Signed)
Addended by: Dennison Mascot on: 10/19/2014 01:51 PM   Modules accepted: Level of Service

## 2014-10-19 NOTE — Progress Notes (Signed)
Patient alert and oriented X4, complaints of chronic back pain, pain meds given. vss at this time. Patient NSR on telemetry. Will continue to assess. Lisa Leblanc

## 2014-10-19 NOTE — Progress Notes (Signed)
*  PRELIMINARY RESULTS* Echocardiogram 2D Echocardiogram has been performed.  Garrel Ridgel Stills 10/19/2014, 4:42 PM

## 2014-10-19 NOTE — Progress Notes (Signed)
Patient ID: Lisa Leblanc, female   DOB: 1961/01/28, 54 y.o.   MRN: [id number] [facility] PROGRESS NOTE  PCP: Dennison Mascot, MD  HPI/Subjective: Patient sent in for acute on chronic kidney disease. She feels weak. Some shortness of breath. Some nausea and decreased appetite. Creatinine on 10/12/2014 is 6.89. Creatinine today 3.60. She states her swelling in her legs has come down a little bit.    Objective: Filed Vitals:   10/19/14 1000  BP: 114/78  Pulse: 89  Temp:   Resp:     Filed Weights   10/18/14 1219 10/18/14 1527 10/19/14 0500  Weight: 77.021 kg (169 lb 12.8 oz) 71.124 kg (156 lb 12.8 oz) 71.26 kg (157 lb 1.6 oz)    ROS: Review of Systems  Constitutional: Negative for fever and chills.  Eyes: Negative for blurred vision.  Respiratory: Positive for shortness of breath. Negative for cough.   Cardiovascular: Positive for leg swelling. Negative for chest pain.  Gastrointestinal: Positive for nausea. Negative for vomiting, abdominal pain, diarrhea and constipation.  Genitourinary: Negative for dysuria.  Musculoskeletal: Negative for joint pain.  Neurological: Negative for dizziness and headaches.   Exam: Physical Exam  Constitutional: She is oriented to person, place, and time.  HENT:  Nose: No mucosal edema.  Mouth/Throat: No oropharyngeal exudate or posterior oropharyngeal edema.  Eyes: Conjunctivae, EOM and lids are normal. Pupils are equal, round, and reactive to light.  Neck: No JVD present. Carotid bruit is not present. No edema present. No thyroid mass and no thyromegaly present.  Cardiovascular: S1 normal and S2 normal.  Exam reveals no gallop.   Murmur heard.  Systolic murmur is present with a grade of 2/6  Pulses:      Dorsalis pedis pulses are 2+ on the right side, and 2+ on the left side.  Respiratory: No respiratory distress. She has no wheezes. She has no rhonchi. She has rales in the right lower field and the left lower field.   GI: Soft. Bowel sounds are normal. There is no tenderness.  Musculoskeletal:       Right ankle: She exhibits swelling.       Left ankle: She exhibits swelling.  Lymphadenopathy:    She has no cervical adenopathy.  Neurological: She is alert and oriented to person, place, and time. No cranial nerve deficit.  Skin: Skin is warm. No rash noted. Nails show no clubbing.  Psychiatric: She has a normal mood and affect.    Data Reviewed: Basic Metabolic Panel:  Recent Labs Lab 10/12/14 1635 10/18/14 1205 10/18/14 2057 10/19/14 0411  NA 135 135  --  141  K 4.0 2.6* 3.0* 3.5  CL 98* 94*  --  100*  CO2 22 27  --  30  GLUCOSE 116* 138*  --  89  BUN 44* 33*  --  34*  CREATININE 6.89* 3.36*  --  3.60*  CALCIUM 8.2* 8.1*  --  8.3*  MG  --  1.6*  --   --    Liver Function Tests:  Recent Labs Lab 10/12/14 1635 10/18/14 1205  AST 24 23  ALT 13* 10*  ALKPHOS 96 97  BILITOT 0.4 0.5  PROT 6.3* 6.2*  ALBUMIN 2.9* 2.6*   CBC:  Recent Labs Lab 10/18/14 1205  WBC 9.1  HGB 10.0*  HCT 30.5*  MCV 87.5  PLT 496*    Studies: X-ray Chest Pa And Lateral  10/18/2014   CLINICAL DATA:  History of congestive heart failure. Chronic kidney disease  stage 3, hypertension, diabetes, COPD, tobacco abuse. Worsening lower extremity edema, weight gain, nausea, vomiting, weakness.  EXAM: CHEST  2 VIEW  COMPARISON:  09/10/2014  FINDINGS: Cardiomegaly. Tortuosity of the thoracic aorta. Linear areas of scarring in the right lung base and left lingula. No overt edema. No effusions or confluent opacity. Multiple compression fractures in the mid and lower thoracic spine post vertebroplasty, stable.  IMPRESSION: Cardiomegaly.  No overt edema.   Electronically Signed   By: Charlett Nose M.D.   On: 10/18/2014 14:11   US Renal  10/18/2014   CLINICAL DATA:  Acute renal failure  EXAM: RENAL / URINARY TRACT ULTRASOUND COMPLETE  COMPARISON:  None.  FINDINGS: Right Kidney:  Length: 10.8 cm. Echogenicity within  normal limits. No mass or hydronephrosis visualized.  Left Kidney:  Length: 11.2 cm. Echogenicity within normal limits. No mass or hydronephrosis visualized.  Bladder:  Appears normal for degree of bladder distention.  IMPRESSION: 1. Normal renal sonogram.   Electronically Signed   By: Signa Kell M.D.   On: 10/18/2014 15:17   US Venous Img Lower Unilateral Left  10/18/2014   CLINICAL DATA:  Left lower extremity edema and pain.  EXAM: LEFT LOWER EXTREMITY VENOUS DOPPLER ULTRASOUND  TECHNIQUE: Gray-scale sonography with graded compression, as well as color Doppler and duplex ultrasound were performed to evaluate the lower extremity deep venous systems from the level of the common femoral vein and including the common femoral, femoral, profunda femoral, popliteal and calf veins including the posterior tibial, peroneal and gastrocnemius veins when visible. The superficial great saphenous vein was also interrogated. Spectral Doppler was utilized to evaluate flow at rest and with distal augmentation maneuvers in the common femoral, femoral and popliteal veins.  COMPARISON:  None.  FINDINGS: Contralateral Common Femoral Vein: Respiratory phasicity is normal and symmetric with the symptomatic side. No evidence of thrombus. Normal compressibility.  Common Femoral Vein: No evidence of thrombus. Normal compressibility, respiratory phasicity and response to augmentation.  Saphenofemoral Junction: No evidence of thrombus. Normal compressibility and flow on color Doppler imaging.  Profunda Femoral Vein: No evidence of thrombus. Normal compressibility and flow on color Doppler imaging.  Femoral Vein: No evidence of thrombus. Normal compressibility, respiratory phasicity and response to augmentation.  Popliteal Vein: No evidence of thrombus. Normal compressibility, respiratory phasicity and response to augmentation.  Calf Veins: No evidence of thrombus. Normal compressibility and flow on color Doppler imaging.  Superficial  Great Saphenous Vein: No evidence of thrombus. Normal compressibility and flow on color Doppler imaging.  Venous Reflux:  None.  Other Findings: No evidence of superficial thrombophlebitis or abnormal fluid collection.  IMPRESSION: No evidence of left lower extremity deep venous thrombosis.   Electronically Signed   By: Irish Lack M.D.   On: 10/18/2014 15:22    Scheduled Meds: . aspirin EC  81 mg Oral Daily  . doxazosin  8 mg Oral BID  . [START ON 10/20/2014] famotidine  20 mg Oral Daily  . heparin  5,000 Units Subcutaneous 3 times per day  . labetalol  300 mg Oral TID  . Linaclotide  145 mcg Oral Daily  . montelukast  10 mg Oral QHS  . morphine  15 mg Oral Q12H  . sodium chloride  3 mL Intravenous Q12H    Assessment/Plan:  1. Acute renal failure on chronic kidney disease stage III- patient's creatinine has improved since the 21st. Nephrology believes this is ATN secondary to overdiuresis and vancomycin use. Continue to monitor creatinine on a daily  basis. Renal sonogram is normal. 2. Acute diastolic congestive heart failure- echocardiogram ordered. One dose of Lasix ordered by nephrology. 3. Hypokalemia and hypomagnesemia- replace electrolytes orally. 4. Positive urinalysis- send urine culture. 5. Essential hypertension stable on meds. 6. Chronic constipation on Linzess 7. Patient is not a diabetic. Stop Fingersticks.  Code Status:     Code Status Orders        Start     Ordered   10/18/14 2006  Do not attempt resuscitation (DNR)   Continuous    Question Answer Comment  In the event of cardiac or respiratory ARREST Do not call a "code blue"   In the event of cardiac or respiratory ARREST Do not perform Intubation, CPR, defibrillation or ACLS   In the event of cardiac or respiratory ARREST Use medication by any route, position, wound care, and other measures to relive pain and suffering. May use oxygen, suction and manual treatment of airway obstruction as needed for comfort.       10/18/14 2005      Disposition Plan: Home once better  Time spent: 25 minutes  Alford Highland  [facility]

## 2014-10-19 NOTE — Progress Notes (Signed)
[facility]  ROUNDING NOTE   Subjective:   Patient states she is feeling somewhat better. No appetite. Increasing peripheral edema.  Sitting in chair this morning.   Objective:  Vital signs in last 24 hours:  Temp:  [98.3 F (36.8 C)-99.2 F (37.3 C)] 98.3 F (36.8 C) (07/28 0420) Pulse Rate:  [79-89] 79 (07/28 0420) Resp:  [18-22] 18 (07/27 1934) BP: (95-122)/(66-74) 120/74 mmHg (07/28 0420) SpO2:  [100 %] 100 % (07/28 0420) Weight:  [71.124 kg (156 lb 12.8 oz)-77.021 kg (169 lb 12.8 oz)] 71.26 kg (157 lb 1.6 oz) (07/28 0500)  Weight change:  Filed Weights   10/18/14 1219 10/18/14 1527 10/19/14 0500  Weight: 77.021 kg (169 lb 12.8 oz) 71.124 kg (156 lb 12.8 oz) 71.26 kg (157 lb 1.6 oz)    Intake/Output:     Intake/Output this shift:     Physical Exam: General: NAD  Head: Normocephalic, atraumatic. Moist oral mucosal membranes  Eyes: Anicteric, PERRL  Neck: Supple, trachea midline  Lungs:  Clear to auscultation  Heart: Regular rate and rhythm  Abdomen:  Soft, nontender,   Extremities:  ++ peripheral edema.  Neurologic: Nonfocal, moving all four extremities  Skin: No lesions       Basic Metabolic Panel:  Recent Labs Lab 10/12/14 1635 10/18/14 1205 10/18/14 2057 10/19/14 0411  NA 135 135  --  141  K 4.0 2.6* 3.0* 3.5  CL 98* 94*  --  100*  CO2 22 27  --  30  GLUCOSE 116* 138*  --  89  BUN 44* 33*  --  34*  CREATININE 6.89* 3.36*  --  3.60*  CALCIUM 8.2* 8.1*  --  8.3*  MG  --  1.6*  --   --     Liver Function Tests:  Recent Labs Lab 10/12/14 1635 10/18/14 1205  AST 24 23  ALT 13* 10*  ALKPHOS 96 97  BILITOT 0.4 0.5  PROT 6.3* 6.2*  ALBUMIN 2.9* 2.6*   No results for input(s): LIPASE, AMYLASE in the last 168 hours. No results for input(s): AMMONIA in the last 168 hours.  CBC:  Recent Labs Lab 10/18/14 1205  WBC 9.1  HGB 10.0*  HCT 30.5*  MCV 87.5  PLT 496*    Cardiac Enzymes: No results for input(s): CKTOTAL,  CKMB, CKMBINDEX, TROPONINI in the last 168 hours.  BNP: Invalid input(s): POCBNP  CBG:  Recent Labs Lab 10/18/14 1748 10/18/14 2036 10/19/14 0755  GLUCAP 103* 111* 126*    Microbiology: Results for orders placed or performed during the hospital encounter of 01/31/14  MRSA PCR Screening     Status: None   Collection Time: 01/31/14 11:00 PM  Result Value Ref Range Status   MRSA by PCR NEGATIVE NEGATIVE Final    Comment:        The GeneXpert MRSA Assay (FDA approved for NASAL specimens only), is one component of a comprehensive MRSA colonization surveillance program. It is not intended to diagnose MRSA infection nor to guide or monitor treatment for MRSA infections.   Urine culture     Status: None   Collection Time: 02/02/14  4:10 PM  Result Value Ref Range Status   Specimen Description URINE, CLEAN CATCH  Final   Special Requests Normal  Final   Culture  Setup Time   Final    02/02/2014 22:39 Performed at [facility]   Final    9,000 COLONIES/ML Performed at [facility]  Culture   Final    INSIGNIFICANT GROWTH Performed at [facility]    Report Status 02/03/2014 FINAL  Final  Culture, blood (routine x 2)     Status: None   Collection Time: 02/02/14  5:00 PM  Result Value Ref Range Status   Specimen Description BLOOD LEFT HAND  Final   Special Requests BOTTLES DRAWN AEROBIC ONLY [facility]  Final   Culture  Setup Time   Final    02/02/2014 21:12 Performed at [facility]    Culture   Final    NO GROWTH 5 DAYS Performed at [facility]    Report Status 02/08/2014 FINAL  Final  Culture, blood (routine x 2)     Status: None   Collection Time: 02/02/14  5:05 PM  Result Value Ref Range Status   Specimen Description BLOOD RIGHT HAND  Final   Special Requests BOTTLES DRAWN AEROBIC ONLY [facility]  Final   Culture  Setup Time   Final    02/02/2014 21:13 Performed at [facility]    Culture   Final     NO GROWTH 5 DAYS Performed at [facility]    Report Status 02/08/2014 FINAL  Final    Coagulation Studies:  Recent Labs  10/18/14 1205  LABPROT 14.6  INR 1.12    Urinalysis:  Recent Labs  10/18/14 1615  COLORURINE YELLOW*  LABSPEC 1.006  PHURINE 5.0  GLUCOSEU NEGATIVE  HGBUR NEGATIVE  BILIRUBINUR NEGATIVE  KETONESUR NEGATIVE  PROTEINUR NEGATIVE  NITRITE NEGATIVE  LEUKOCYTESUR 2+*      Imaging: X-ray Chest Pa And Lateral  10/18/2014   CLINICAL DATA:  History of congestive heart failure. Chronic kidney disease stage 3, hypertension, diabetes, COPD, tobacco abuse. Worsening lower extremity edema, weight gain, nausea, vomiting, weakness.  EXAM: CHEST  2 VIEW  COMPARISON:  09/10/2014  FINDINGS: Cardiomegaly. Tortuosity of the thoracic aorta. Linear areas of scarring in the right lung base and left lingula. No overt edema. No effusions or confluent opacity. Multiple compression fractures in the mid and lower thoracic spine post vertebroplasty, stable.  IMPRESSION: Cardiomegaly.  No overt edema.   Electronically Signed   By: Charlett Nose M.D.   On: 10/18/2014 14:11   US Renal  10/18/2014   CLINICAL DATA:  Acute renal failure  EXAM: RENAL / URINARY TRACT ULTRASOUND COMPLETE  COMPARISON:  None.  FINDINGS: Right Kidney:  Length: 10.8 cm. Echogenicity within normal limits. No mass or hydronephrosis visualized.  Left Kidney:  Length: 11.2 cm. Echogenicity within normal limits. No mass or hydronephrosis visualized.  Bladder:  Appears normal for degree of bladder distention.  IMPRESSION: 1. Normal renal sonogram.   Electronically Signed   By: Signa Kell M.D.   On: 10/18/2014 15:17   US Venous Img Lower Unilateral Left  10/18/2014   CLINICAL DATA:  Left lower extremity edema and pain.  EXAM: LEFT LOWER EXTREMITY VENOUS DOPPLER ULTRASOUND  TECHNIQUE: Gray-scale sonography with graded compression, as well as color Doppler and duplex ultrasound were performed to evaluate the  lower extremity deep venous systems from the level of the common femoral vein and including the common femoral, femoral, profunda femoral, popliteal and calf veins including the posterior tibial, peroneal and gastrocnemius veins when visible. The superficial great saphenous vein was also interrogated. Spectral Doppler was utilized to evaluate flow at rest and with distal augmentation maneuvers in the common femoral, femoral and popliteal veins.  COMPARISON:  None.  FINDINGS: Contralateral Common Femoral Vein: Respiratory  phasicity is normal and symmetric with the symptomatic side. No evidence of thrombus. Normal compressibility.  Common Femoral Vein: No evidence of thrombus. Normal compressibility, respiratory phasicity and response to augmentation.  Saphenofemoral Junction: No evidence of thrombus. Normal compressibility and flow on color Doppler imaging.  Profunda Femoral Vein: No evidence of thrombus. Normal compressibility and flow on color Doppler imaging.  Femoral Vein: No evidence of thrombus. Normal compressibility, respiratory phasicity and response to augmentation.  Popliteal Vein: No evidence of thrombus. Normal compressibility, respiratory phasicity and response to augmentation.  Calf Veins: No evidence of thrombus. Normal compressibility and flow on color Doppler imaging.  Superficial Great Saphenous Vein: No evidence of thrombus. Normal compressibility and flow on color Doppler imaging.  Venous Reflux:  None.  Other Findings: No evidence of superficial thrombophlebitis or abnormal fluid collection.  IMPRESSION: No evidence of left lower extremity deep venous thrombosis.   Electronically Signed   By: Irish Lack M.D.   On: 10/18/2014 15:22     Medications:     . aspirin EC  81 mg Oral Daily  . doxazosin  8 mg Oral BID  . famotidine  20 mg Oral BID  . furosemide  60 mg Intravenous Once  . heparin  5,000 Units Subcutaneous 3 times per day  . insulin aspart  0-15 Units Subcutaneous TID WC   . insulin aspart  0-5 Units Subcutaneous QHS  . labetalol  300 mg Oral TID  . Linaclotide  145 mcg Oral Daily  . montelukast  10 mg Oral QHS  . morphine  15 mg Oral Q12H  . sodium chloride  3 mL Intravenous Q12H   acetaminophen **OR** acetaminophen, albuterol, bisacodyl, cyclobenzaprine, nitroGLYCERIN, ondansetron **OR** ondansetron (ZOFRAN) IV, oxyCODONE  Assessment/ Plan:  Lisa Leblanc is a 54 y.o. black  female with long-standing type 2 diabetes insulin dependent, diabetic chronic kidney disease, hypertension, history of aortic dissection back in November 2015, chronic back pain, systolic and diastolic congestive heart failure, atherosclerosis, narcotic dependence  1. Acute renal failure: on chronic kidney disease stage III: Basline creatinine of 1.87 from June 2016 On July 21 creatinine of 6.89 which is a significant increase from 2.23 noted on July 14. Patient was discharged from [facility] on July 14. no other lab results are available in between July 14 to July 21. Creatinine has now improved to 3.6. Patient reports urine output but not recorded.  - she may have been overdiuresed with torsemide as she was sent home with this medication from [facility].  - Kidney function fluctuating like this leads to believe driven by fluid or recovering from ATN. ATN secondary to vancomycin.  - no real indication for renal vascular disease or glomerulonephritis.  - no acute indication for dialysis at this time.  - Start recording urine output.   2. Diastolic Congestive Heart Failure: with peripheral edema. Acute exacerbation. - will give IV furosemide 60mg  x one - will get an echo  3. Hypertension: well controlled.  - doxazosin, labetolol - requesting a regular diet, she does not want a low salt diet.   4. Diabetes mellitus type II with chronic kidney disease: continue insulin for glucose control.      LOS: 1 Lisa Leblanc 7/28/20169:42 AM

## 2014-10-20 LAB — HEPATITIS B CORE ANTIBODY, IGM: HEP B C IGM: NEGATIVE

## 2014-10-20 LAB — PROTEIN ELECTROPHORESIS, SERUM
A/G Ratio: 0.8 (ref 0.7–1.7)
ALPHA-1-GLOBULIN: 0.3 g/dL (ref 0.0–0.4)
ALPHA-2-GLOBULIN: 1.3 g/dL — AB (ref 0.4–1.0)
Albumin ELP: 2.5 g/dL — ABNORMAL LOW (ref 2.9–4.4)
Beta Globulin: 0.8 g/dL (ref 0.7–1.3)
Gamma Globulin: 0.8 g/dL (ref 0.4–1.8)
Globulin, Total: 3.3 g/dL (ref 2.2–3.9)
TOTAL PROTEIN ELP: 5.8 g/dL — AB (ref 6.0–8.5)

## 2014-10-20 LAB — ENA+DNA/DS+SJORGEN'S
ENA SM Ab Ser-aCnc: <0.2 AI (ref 0.0–0.9)
Ribonucleic Protein: <0.2 AI (ref 0.0–0.9)
SSA (RO) (ENA) ANTIBODY, IGG: 6.3 AI — AB (ref 0.0–0.9)
SSB (La) (ENA) Antibody, IgG: <0.2 AI (ref 0.0–0.9)
ds DNA Ab: <1 IU/mL (ref 0–9)

## 2014-10-20 LAB — PARATHYROID HORMONE, INTACT (NO CA): PTH: 65 pg/mL (ref 15–65)

## 2014-10-20 LAB — ANCA TITERS
Atypical P-ANCA titer: <1:20 {titer}
C-ANCA: <1:20 {titer}

## 2014-10-20 LAB — MPO/PR-3 (ANCA) ANTIBODIES: Myeloperoxidase Abs: <9 U/mL (ref 0.0–9.0)

## 2014-10-20 LAB — BASIC METABOLIC PANEL
ANION GAP: 10 (ref 5–15)
BUN: 35 mg/dL — ABNORMAL HIGH (ref 6–20)
CO2: 30 mmol/L (ref 22–32)
Calcium: 8.1 mg/dL — ABNORMAL LOW (ref 8.9–10.3)
Chloride: 98 mmol/L — ABNORMAL LOW (ref 101–111)
Creatinine, Ser: 3.18 mg/dL — ABNORMAL HIGH (ref 0.44–1.00)
GFR calc Af Amer: 18 mL/min — ABNORMAL LOW (ref >60)
GFR, EST NON AFRICAN AMERICAN: 15 mL/min — AB (ref >60)
Glucose, Bld: 78 mg/dL (ref 65–99)
Potassium: 3.2 mmol/L — ABNORMAL LOW (ref 3.5–5.1)
Sodium: 138 mmol/L (ref 135–145)

## 2014-10-20 LAB — URINE CULTURE: Special Requests: NORMAL

## 2014-10-20 LAB — HEPATITIS B SURFACE ANTIGEN: HEP B S AG: NEGATIVE

## 2014-10-20 LAB — ANA W/REFLEX: ANA: POSITIVE — AB

## 2014-10-20 LAB — HEPATITIS C ANTIBODY: HCV Ab: 0.4 s/co ratio (ref 0.0–0.9)

## 2014-10-20 LAB — KAPPA/LAMBDA LIGHT CHAINS
Kappa free light chain: 91.83 mg/L — ABNORMAL HIGH (ref 3.30–19.40)
Kappa, lambda light chain ratio: 1.15 (ref 0.26–1.65)
LAMDA FREE LIGHT CHAINS: 79.67 mg/L — AB (ref 5.71–26.30)

## 2014-10-20 LAB — HEPATITIS B SURFACE ANTIBODY,QUALITATIVE: Hep B S Ab: NONREACTIVE

## 2014-10-20 LAB — C3 COMPLEMENT: C3 COMPLEMENT: 126 mg/dL (ref 82–167)

## 2014-10-20 LAB — GLOMERULAR BASEMENT MEMBRANE ANTIBODIES: GBM AB: 7 U (ref 0–20)

## 2014-10-20 LAB — C4 COMPLEMENT: Complement C4, Body Fluid: 42 mg/dL (ref 14–44)

## 2014-10-20 MED ORDER — TORSEMIDE 20 MG PO TABS
20.0000 mg | ORAL_TABLET | Freq: Two times a day (BID) | ORAL | Status: DC
  Administered 2014-10-20 – 2014-10-21 (×3): 20 mg via ORAL
  Filled 2014-10-20 (×3): qty 1
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
## 2014-10-20 NOTE — Progress Notes (Signed)
Made dr. Renae Gloss aware patient has no iv and patient does not want to be stuck again. Per md okay to leave iv out [employer]

## 2014-10-20 NOTE — Progress Notes (Signed)
[facility]  ROUNDING NOTE   Subjective:   Patient sitting in chair. Edema improved. Creatinine 3.18 (3.6)  Objective:  Vital signs in last 24 hours:  Temp:  [98 F (36.7 C)-99.5 F (37.5 C)] 99.5 F (37.5 C) (07/29 0502) Pulse Rate:  [83-91] 86 (07/29 0502) Resp:  [18-20] 20 (07/29 0502) BP: (102-126)/(69-79) 116/69 mmHg (07/29 0502) SpO2:  [99 %-100 %] 100 % (07/29 0502) Weight:  [72.394 kg (159 lb 9.6 oz)] 72.394 kg (159 lb 9.6 oz) (07/29 0502)  Weight change: -4.627 kg (-10 lb 3.2 oz) Filed Weights   10/18/14 1527 10/19/14 0500 10/20/14 0502  Weight: 71.124 kg (156 lb 12.8 oz) 71.26 kg (157 lb 1.6 oz) 72.394 kg (159 lb 9.6 oz)    Intake/Output: I/O last 3 completed shifts: In: -  Out: 700 [Urine:700]   Intake/Output this shift:  Total I/O In: -  Out: 400 [Urine:400]  Physical Exam: General: NAD  Head: Normocephalic, atraumatic. Moist oral mucosal membranes  Eyes: Anicteric, PERRL  Neck: Supple, trachea midline  Lungs:  Clear to auscultation  Heart: Regular rate and rhythm  Abdomen:  Soft, nontender,   Extremities:  + peripheral edema.  Neurologic: Nonfocal, moving all four extremities  Skin: No lesions       Basic Metabolic Panel:  Recent Labs Lab 10/18/14 1205 10/18/14 2057 10/19/14 0411 10/19/14 1015 10/20/14 0506  NA 135  --  141  --  138  K 2.6* 3.0* 3.5  --  3.2*  CL 94*  --  100*  --  98*  CO2 27  --  30  --  30  GLUCOSE 138*  --  89  --  78  BUN 33*  --  34*  --  35*  CREATININE 3.36*  --  3.60*  --  3.18*  CALCIUM 8.1*  --  8.3*  --  8.1*  MG 1.6*  --   --   --   --   PHOS  --   --   --  3.5  --     Liver Function Tests:  Recent Labs Lab 10/18/14 1205  AST 23  ALT 10*  ALKPHOS 97  BILITOT 0.5  PROT 6.2*  ALBUMIN 2.6*   No results for input(s): LIPASE, AMYLASE in the last 168 hours. No results for input(s): AMMONIA in the last 168 hours.  CBC:  Recent Labs Lab 10/18/14 1205  WBC 9.1  HGB 10.0*  HCT  30.5*  MCV 87.5  PLT 496*    Cardiac Enzymes: No results for input(s): CKTOTAL, CKMB, CKMBINDEX, TROPONINI in the last 168 hours.  BNP: Invalid input(s): POCBNP  CBG:  Recent Labs Lab 10/18/14 1748 10/18/14 2036 10/19/14 0755 10/19/14 1111 10/19/14 1707  GLUCAP 103* 111* 126* 131* 106*    Microbiology: Results for orders placed or performed during the hospital encounter of 01/31/14  MRSA PCR Screening     Status: None   Collection Time: 01/31/14 11:00 PM  Result Value Ref Range Status   MRSA by PCR NEGATIVE NEGATIVE Final    Comment:        The GeneXpert MRSA Assay (FDA approved for NASAL specimens only), is one component of a comprehensive MRSA colonization surveillance program. It is not intended to diagnose MRSA infection nor to guide or monitor treatment for MRSA infections.   Urine culture     Status: None   Collection Time: 02/02/14  4:10 PM  Result Value Ref Range Status   Specimen Description URINE,  CLEAN CATCH  Final   Special Requests Normal  Final   Culture  Setup Time   Final    02/02/2014 22:39 Performed at [facility]   Final    9,000 COLONIES/ML Performed at [facility]    Culture   Final    INSIGNIFICANT GROWTH Performed at [facility]    Report Status 02/03/2014 FINAL  Final  Culture, blood (routine x 2)     Status: None   Collection Time: 02/02/14  5:00 PM  Result Value Ref Range Status   Specimen Description BLOOD LEFT HAND  Final   Special Requests BOTTLES DRAWN AEROBIC ONLY [facility]  Final   Culture  Setup Time   Final    02/02/2014 21:12 Performed at [facility]    Culture   Final    NO GROWTH 5 DAYS Performed at [facility]    Report Status 02/08/2014 FINAL  Final  Culture, blood (routine x 2)     Status: None   Collection Time: 02/02/14  5:05 PM  Result Value Ref Range Status   Specimen Description BLOOD RIGHT HAND  Final   Special Requests BOTTLES DRAWN AEROBIC  ONLY [facility]  Final   Culture  Setup Time   Final    02/02/2014 21:13 Performed at [facility]    Culture   Final    NO GROWTH 5 DAYS Performed at [facility]    Report Status 02/08/2014 FINAL  Final    Coagulation Studies:  Recent Labs  10/18/14 1205  LABPROT 14.6  INR 1.12    Urinalysis:  Recent Labs  10/18/14 1615  COLORURINE YELLOW*  LABSPEC 1.006  PHURINE 5.0  GLUCOSEU NEGATIVE  HGBUR NEGATIVE  BILIRUBINUR NEGATIVE  KETONESUR NEGATIVE  PROTEINUR NEGATIVE  NITRITE NEGATIVE  LEUKOCYTESUR 2+*      Imaging: X-ray Chest Pa And Lateral  10/18/2014   CLINICAL DATA:  History of congestive heart failure. Chronic kidney disease stage 3, hypertension, diabetes, COPD, tobacco abuse. Worsening lower extremity edema, weight gain, nausea, vomiting, weakness.  EXAM: CHEST  2 VIEW  COMPARISON:  09/10/2014  FINDINGS: Cardiomegaly. Tortuosity of the thoracic aorta. Linear areas of scarring in the right lung base and left lingula. No overt edema. No effusions or confluent opacity. Multiple compression fractures in the mid and lower thoracic spine post vertebroplasty, stable.  IMPRESSION: Cardiomegaly.  No overt edema.   Electronically Signed   By: Charlett Nose M.D.   On: 10/18/2014 14:11   US Renal  10/18/2014   CLINICAL DATA:  Acute renal failure  EXAM: RENAL / URINARY TRACT ULTRASOUND COMPLETE  COMPARISON:  None.  FINDINGS: Right Kidney:  Length: 10.8 cm. Echogenicity within normal limits. No mass or hydronephrosis visualized.  Left Kidney:  Length: 11.2 cm. Echogenicity within normal limits. No mass or hydronephrosis visualized.  Bladder:  Appears normal for degree of bladder distention.  IMPRESSION: 1. Normal renal sonogram.   Electronically Signed   By: Signa Kell M.D.   On: 10/18/2014 15:17   US Venous Img Lower Unilateral Left  10/18/2014   CLINICAL DATA:  Left lower extremity edema and pain.  EXAM: LEFT LOWER EXTREMITY VENOUS DOPPLER ULTRASOUND  TECHNIQUE:  Gray-scale sonography with graded compression, as well as color Doppler and duplex ultrasound were performed to evaluate the lower extremity deep venous systems from the level of the common femoral vein and including the common femoral, femoral, profunda femoral, popliteal and calf veins  including the posterior tibial, peroneal and gastrocnemius veins when visible. The superficial great saphenous vein was also interrogated. Spectral Doppler was utilized to evaluate flow at rest and with distal augmentation maneuvers in the common femoral, femoral and popliteal veins.  COMPARISON:  None.  FINDINGS: Contralateral Common Femoral Vein: Respiratory phasicity is normal and symmetric with the symptomatic side. No evidence of thrombus. Normal compressibility.  Common Femoral Vein: No evidence of thrombus. Normal compressibility, respiratory phasicity and response to augmentation.  Saphenofemoral Junction: No evidence of thrombus. Normal compressibility and flow on color Doppler imaging.  Profunda Femoral Vein: No evidence of thrombus. Normal compressibility and flow on color Doppler imaging.  Femoral Vein: No evidence of thrombus. Normal compressibility, respiratory phasicity and response to augmentation.  Popliteal Vein: No evidence of thrombus. Normal compressibility, respiratory phasicity and response to augmentation.  Calf Veins: No evidence of thrombus. Normal compressibility and flow on color Doppler imaging.  Superficial Great Saphenous Vein: No evidence of thrombus. Normal compressibility and flow on color Doppler imaging.  Venous Reflux:  None.  Other Findings: No evidence of superficial thrombophlebitis or abnormal fluid collection.  IMPRESSION: No evidence of left lower extremity deep venous thrombosis.   Electronically Signed   By: Irish Lack M.D.   On: 10/18/2014 15:22     Medications:     . aspirin EC  81 mg Oral Daily  . doxazosin  8 mg Oral BID  . famotidine  20 mg Oral Daily  . heparin  5,000  Units Subcutaneous 3 times per day  . labetalol  300 mg Oral TID  . Linaclotide  145 mcg Oral Daily  . magnesium oxide  400 mg Oral Daily  . montelukast  10 mg Oral QHS  . morphine  15 mg Oral Q12H  . potassium chloride  20 mEq Oral Daily  . sodium chloride  3 mL Intravenous Q12H  . torsemide  20 mg Oral BID   acetaminophen **OR** acetaminophen, albuterol, bisacodyl, cyclobenzaprine, nitroGLYCERIN, ondansetron **OR** ondansetron (ZOFRAN) IV, oxyCODONE  Assessment/ Plan:  Ms. Lisa Leblanc is a 54 y.o. black  female with long-standing type 2 diabetes insulin dependent, diabetic chronic kidney disease, hypertension, history of aortic dissection back in November 2015, chronic back pain, systolic and diastolic congestive heart failure, atherosclerosis, narcotic dependence  1. Acute renal failure: on chronic kidney disease stage III: Basline creatinine of 1.87 from June 2016 On July 21 creatinine of 6.89 which is a significant increase from 2.23 noted on July 14. Patient was discharged from [facility] on July 14. no other lab results are available in between July 14 to July 21. Creatinine has now improved to 3.18. Suspect patient has acute cardiorenal syndrome and will benefit from diuretics.  - no real indication for renal vascular disease or glomerulonephritis.  - no acute indication for dialysis at this time.   2. Diastolic Congestive Heart Failure: with peripheral edema. Acute exacerbation. - echo pending - restarted torsemide 20mg  bid.   3. Hypertension: well controlled.  - doxazosin, labetolol     LOS: 2 Arine Foley 7/29/20168:48 AM

## 2014-10-20 NOTE — Progress Notes (Signed)
Patient ID: Lisa Leblanc, female   DOB: [birth year], 54 y.o.   MRN: [id number] [facility] PROGRESS NOTE  PCP: Dennison Mascot, MD  HPI/Subjective: Patient with some shortness of breath and swelling of the lower extremities. States she is urinating well. Her major complaint is back pain.    Objective: Filed Vitals:   10/20/14 0502  BP: 116/69  Pulse: 86  Temp: 99.5 F (37.5 C)  Resp: 20    Filed Weights   10/18/14 1527 10/19/14 0500 10/20/14 0502  Weight: 71.124 kg (156 lb 12.8 oz) 71.26 kg (157 lb 1.6 oz) 72.394 kg (159 lb 9.6 oz)    ROS: Review of Systems  Constitutional: Negative for fever and chills.  Eyes: Negative for blurred vision.  Respiratory: Positive for shortness of breath. Negative for cough.   Cardiovascular: Positive for leg swelling. Negative for chest pain.  Gastrointestinal: Positive for nausea. Negative for vomiting, abdominal pain, diarrhea and constipation.  Genitourinary: Negative for dysuria.  Musculoskeletal: Negative for joint pain.  Neurological: Negative for dizziness and headaches.   Exam: Physical Exam  Constitutional: She is oriented to person, place, and time.  HENT:  Nose: No mucosal edema.  Mouth/Throat: No oropharyngeal exudate or posterior oropharyngeal edema.  Eyes: Conjunctivae, EOM and lids are normal. Pupils are equal, round, and reactive to light.  Neck: No JVD present. Carotid bruit is not present. No thyroid mass and no thyromegaly present.  Cardiovascular: S1 normal and S2 normal.  Exam reveals no gallop.   Murmur heard.  Systolic murmur is present with a grade of 2/6  Pulses:      Dorsalis pedis pulses are 2+ on the right side, and 2+ on the left side.  Respiratory: No respiratory distress. She has no wheezes. She has no rhonchi. She has rales in the right lower field and the left lower field.  GI: Soft. Bowel sounds are normal. There is no tenderness.  Musculoskeletal:       Right ankle: She exhibits  swelling.       Left ankle: She exhibits swelling.  Lymphadenopathy:    She has no cervical adenopathy.  Neurological: She is alert and oriented to person, place, and time. No cranial nerve deficit.  Skin: Skin is warm. No rash noted. Nails show no clubbing.  Psychiatric: She has a normal mood and affect.    Data Reviewed: Basic Metabolic Panel:  Recent Labs Lab 10/18/14 1205 10/18/14 2057 10/19/14 0411 10/19/14 1015 10/20/14 0506  NA 135  --  141  --  138  K 2.6* 3.0* 3.5  --  3.2*  CL 94*  --  100*  --  98*  CO2 27  --  30  --  30  GLUCOSE 138*  --  89  --  78  BUN 33*  --  34*  --  35*  CREATININE 3.36*  --  3.60*  --  3.18*  CALCIUM 8.1*  --  8.3*  --  8.1*  MG 1.6*  --   --   --   --   PHOS  --   --   --  3.5  --    Liver Function Tests:  Recent Labs Lab 10/18/14 1205  AST 23  ALT 10*  ALKPHOS 97  BILITOT 0.5  PROT 6.2*  ALBUMIN 2.6*   CBC:  Recent Labs Lab 10/18/14 1205  WBC 9.1  HGB 10.0*  HCT 30.5*  MCV 87.5  PLT 496*    Studies: X-ray Chest Pa  And Lateral  10/18/2014   CLINICAL DATA:  History of congestive heart failure. Chronic kidney disease stage 3, hypertension, diabetes, COPD, tobacco abuse. Worsening lower extremity edema, weight gain, nausea, vomiting, weakness.  EXAM: CHEST  2 VIEW  COMPARISON:  09/10/2014  FINDINGS: Cardiomegaly. Tortuosity of the thoracic aorta. Linear areas of scarring in the right lung base and left lingula. No overt edema. No effusions or confluent opacity. Multiple compression fractures in the mid and lower thoracic spine post vertebroplasty, stable.  IMPRESSION: Cardiomegaly.  No overt edema.   Electronically Signed   By: Charlett Nose M.D.   On: 10/18/2014 14:11   US Renal  10/18/2014   CLINICAL DATA:  Acute renal failure  EXAM: RENAL / URINARY TRACT ULTRASOUND COMPLETE  COMPARISON:  None.  FINDINGS: Right Kidney:  Length: 10.8 cm. Echogenicity within normal limits. No mass or hydronephrosis visualized.  Left Kidney:   Length: 11.2 cm. Echogenicity within normal limits. No mass or hydronephrosis visualized.  Bladder:  Appears normal for degree of bladder distention.  IMPRESSION: 1. Normal renal sonogram.   Electronically Signed   By: Signa Kell M.D.   On: 10/18/2014 15:17   US Venous Img Lower Unilateral Left  10/18/2014   CLINICAL DATA:  Left lower extremity edema and pain.  EXAM: LEFT LOWER EXTREMITY VENOUS DOPPLER ULTRASOUND  TECHNIQUE: Gray-scale sonography with graded compression, as well as color Doppler and duplex ultrasound were performed to evaluate the lower extremity deep venous systems from the level of the common femoral vein and including the common femoral, femoral, profunda femoral, popliteal and calf veins including the posterior tibial, peroneal and gastrocnemius veins when visible. The superficial great saphenous vein was also interrogated. Spectral Doppler was utilized to evaluate flow at rest and with distal augmentation maneuvers in the common femoral, femoral and popliteal veins.  COMPARISON:  None.  FINDINGS: Contralateral Common Femoral Vein: Respiratory phasicity is normal and symmetric with the symptomatic side. No evidence of thrombus. Normal compressibility.  Common Femoral Vein: No evidence of thrombus. Normal compressibility, respiratory phasicity and response to augmentation.  Saphenofemoral Junction: No evidence of thrombus. Normal compressibility and flow on color Doppler imaging.  Profunda Femoral Vein: No evidence of thrombus. Normal compressibility and flow on color Doppler imaging.  Femoral Vein: No evidence of thrombus. Normal compressibility, respiratory phasicity and response to augmentation.  Popliteal Vein: No evidence of thrombus. Normal compressibility, respiratory phasicity and response to augmentation.  Calf Veins: No evidence of thrombus. Normal compressibility and flow on color Doppler imaging.  Superficial Great Saphenous Vein: No evidence of thrombus. Normal compressibility  and flow on color Doppler imaging.  Venous Reflux:  None.  Other Findings: No evidence of superficial thrombophlebitis or abnormal fluid collection.  IMPRESSION: No evidence of left lower extremity deep venous thrombosis.   Electronically Signed   By: Irish Lack M.D.   On: 10/18/2014 15:22    Scheduled Meds: . aspirin EC  81 mg Oral Daily  . doxazosin  8 mg Oral BID  . famotidine  20 mg Oral Daily  . heparin  5,000 Units Subcutaneous 3 times per day  . labetalol  300 mg Oral TID  . Linaclotide  145 mcg Oral Daily  . magnesium oxide  400 mg Oral Daily  . montelukast  10 mg Oral QHS  . morphine  15 mg Oral Q12H  . potassium chloride  20 mEq Oral Daily  . sodium chloride  3 mL Intravenous Q12H  . torsemide  20 mg Oral BID  Assessment/Plan:  1. Acute renal failure on chronic kidney disease stage III- patient's creatinine has improved since the 21st. Nephrology believes this is ATN secondary to overdiuresis and vancomycin use. Continue to monitor creatinine on a daily basis. Nephrology wanted to check a creatinine tomorrow. Potential discharge tomorrow with follow-up as outpatient. 2. Acute diastolic congestive heart failure- EF normal range. Torsemide 20 mg twice a day ordered by nephrology. 3. Hypokalemia and hypomagnesemia- replace electrolytes orally. 4. Positive urinalysis- urine culture with multiple organisms suggested re-collection. 5. Essential hypertension stable on meds. 6. Chronic constipation on Linzess 7.   Chronic back pain- looking back at imaging over at [facility] she's had multiple compression fractures.       Consider Fosamax as outpatient.  Code Status:     Code Status Orders        Start     Ordered   10/18/14 2006  Do not attempt resuscitation (DNR)   Continuous    Question Answer Comment  In the event of cardiac or respiratory ARREST Do not call a "code blue"   In the event of cardiac or respiratory ARREST Do not perform Intubation, CPR, defibrillation or  ACLS   In the event of cardiac or respiratory ARREST Use medication by any route, position, wound care, and other measures to relive pain and suffering. May use oxygen, suction and manual treatment of airway obstruction as needed for comfort.      10/18/14 2005      Disposition Plan: Home potentially tomorrow.  Time spent: 20 minutes  Alford Highland  [facility]

## 2014-10-21 LAB — BASIC METABOLIC PANEL
Anion gap: 11 (ref 5–15)
BUN: 31 mg/dL — ABNORMAL HIGH (ref 6–20)
CALCIUM: 8.5 mg/dL — AB (ref 8.9–10.3)
CO2: 32 mmol/L (ref 22–32)
CREATININE: 3.12 mg/dL — AB (ref 0.44–1.00)
Chloride: 95 mmol/L — ABNORMAL LOW (ref 101–111)
GFR calc Af Amer: 18 mL/min — ABNORMAL LOW (ref >60)
GFR, EST NON AFRICAN AMERICAN: 16 mL/min — AB (ref >60)
GLUCOSE: 88 mg/dL (ref 65–99)
Potassium: 3.1 mmol/L — ABNORMAL LOW (ref 3.5–5.1)
Sodium: 138 mmol/L (ref 135–145)

## 2014-10-21 MED ORDER — GUAIFENESIN ER 600 MG PO TB12
600.0000 mg | ORAL_TABLET | Freq: Two times a day (BID) | ORAL | Status: DC
  Administered 2014-10-21: 600 mg via ORAL
  Filled 2014-10-21: qty 1

## 2014-10-21 MED ORDER — GUAIFENESIN 400 MG PO TABS: 400.0000 mg | ORAL_TABLET | Freq: Four times a day (QID) | ORAL | Status: DC | PRN

## 2014-10-21 MED ORDER — POTASSIUM CHLORIDE CRYS ER 20 MEQ PO TBCR: 20.0000 meq | EXTENDED_RELEASE_TABLET | Freq: Every day | ORAL | Status: DC

## 2014-10-21 MED ORDER — LEVOFLOXACIN 500 MG PO TABS: 500.0000 mg | ORAL_TABLET | Freq: Every day | ORAL | Status: AC

## 2014-10-21 MED ORDER — LEVOFLOXACIN 500 MG PO TABS
500.0000 mg | ORAL_TABLET | Freq: Every day | ORAL | Status: DC
  Administered 2014-10-21: 500 mg via ORAL
  Filled 2014-10-21: qty 1

## 2014-10-21 MED ORDER — ASPIRIN 81 MG PO TBEC: 81.0000 mg | DELAYED_RELEASE_TABLET | Freq: Every day | ORAL | Status: DC

## 2014-10-21 MED ORDER — POTASSIUM CHLORIDE CRYS ER 20 MEQ PO TBCR
40.0000 meq | EXTENDED_RELEASE_TABLET | Freq: Once | ORAL | Status: AC
  Administered 2014-10-21: 40 meq via ORAL
  Filled 2014-10-21: qty 2

## 2014-10-21 MED ORDER — TORSEMIDE 20 MG PO TABS: 20.0000 mg | ORAL_TABLET | Freq: Two times a day (BID) | ORAL | Status: DC

## 2014-10-21 NOTE — Discharge Summary (Addendum)
Lisa Leblanc, 54 y.o., DOB 05-10-1960, MRN [id number]. Admission date: 10/18/2014 Discharge Date 10/21/2014 Primary MD Dennison Mascot, MD Admitting Physician Delfino Lovett, MD  Admission Diagnosis  Acute Renal Failure  CKD 3 Edema  Discharge Diagnosis   Active Problems:   ARF (acute renal failure)/ chronic kidney disease stage III  ATN Possible bronchitis Morbid obesity Hypertension Lumbosacral neuritis Narcotic dependence Diet-controlled diabetes Hypercholesterolemia Acute on chronic diastolic CHF Nonischemic cardiomyopathy History of aortic dissection Peripheral vascular disease         Hospital Course Lisa Leblanc is a 54 y.o. female with a known history of chronic diastolic congestive heart failure, CK D stage III, hypertension, diabetes, COPD and tobacco abuse presents to the hospital as a direct admission from her nephrologist's office. Patient was admitted in the last month to [facility] for healthcare acquired pneumonia and treated with vancomycin and cefepime. At discharge her creatinine was around 2. She was seen by her primary care physician Dr. Thana Ates a week back and her creatinine was found to be 6.6. Due to that patient was admitted to the hospital for acute renal failure. Patient was seen by nephrology supportive care was provided. Patient's renal function is stabilized and today's 3.2. She was seen by nephrology who stated that she'll need outpatient close follow-up. Patient also was complaining of cough and congestion feeling like her pneumonia therefore she started on Mucinex and Levaquin.            Consults  nephrology  Significant Tests:  See full reports for all details    X-ray Chest Pa And Lateral  10/18/2014   CLINICAL DATA:  History of congestive heart failure. Chronic kidney disease stage 3, hypertension, diabetes, COPD, tobacco abuse. Worsening lower extremity edema, weight gain, nausea, vomiting, weakness.  EXAM: CHEST  2 VIEW   COMPARISON:  09/10/2014  FINDINGS: Cardiomegaly. Tortuosity of the thoracic aorta. Linear areas of scarring in the right lung base and left lingula. No overt edema. No effusions or confluent opacity. Multiple compression fractures in the mid and lower thoracic spine post vertebroplasty, stable.  IMPRESSION: Cardiomegaly.  No overt edema.   Electronically Signed   By: Charlett Nose M.D.   On: 10/18/2014 14:11   US Renal  10/18/2014   CLINICAL DATA:  Acute renal failure  EXAM: RENAL / URINARY TRACT ULTRASOUND COMPLETE  COMPARISON:  None.  FINDINGS: Right Kidney:  Length: 10.8 cm. Echogenicity within normal limits. No mass or hydronephrosis visualized.  Left Kidney:  Length: 11.2 cm. Echogenicity within normal limits. No mass or hydronephrosis visualized.  Bladder:  Appears normal for degree of bladder distention.  IMPRESSION: 1. Normal renal sonogram.   Electronically Signed   By: Signa Kell M.D.   On: 10/18/2014 15:17   US Venous Img Lower Unilateral Left  10/18/2014   CLINICAL DATA:  Left lower extremity edema and pain.  EXAM: LEFT LOWER EXTREMITY VENOUS DOPPLER ULTRASOUND  TECHNIQUE: Gray-scale sonography with graded compression, as well as color Doppler and duplex ultrasound were performed to evaluate the lower extremity deep venous systems from the level of the common femoral vein and including the common femoral, femoral, profunda femoral, popliteal and calf veins including the posterior tibial, peroneal and gastrocnemius veins when visible. The superficial great saphenous vein was also interrogated. Spectral Doppler was utilized to evaluate flow at rest and with distal augmentation maneuvers in the common femoral, femoral and popliteal veins.  COMPARISON:  None.  FINDINGS: Contralateral Common Femoral Vein: Respiratory phasicity is normal and  symmetric with the symptomatic side. No evidence of thrombus. Normal compressibility.  Common Femoral Vein: No evidence of thrombus. Normal compressibility,  respiratory phasicity and response to augmentation.  Saphenofemoral Junction: No evidence of thrombus. Normal compressibility and flow on color Doppler imaging.  Profunda Femoral Vein: No evidence of thrombus. Normal compressibility and flow on color Doppler imaging.  Femoral Vein: No evidence of thrombus. Normal compressibility, respiratory phasicity and response to augmentation.  Popliteal Vein: No evidence of thrombus. Normal compressibility, respiratory phasicity and response to augmentation.  Calf Veins: No evidence of thrombus. Normal compressibility and flow on color Doppler imaging.  Superficial Great Saphenous Vein: No evidence of thrombus. Normal compressibility and flow on color Doppler imaging.  Venous Reflux:  None.  Other Findings: No evidence of superficial thrombophlebitis or abnormal fluid collection.  IMPRESSION: No evidence of left lower extremity deep venous thrombosis.   Electronically Signed   By: Irish Lack M.D.   On: 10/18/2014 15:22       Today   Subjective:   Lisa Leblanc feels okay complains of some cough and congestion  Objective:   Blood pressure 110/77, pulse 89, temperature 98 F (36.7 C), temperature source Oral, resp. rate 20, height 5\' 4"  (1.626 m), weight 70.67 kg (155 lb 12.8 oz), SpO2 100 %.  .  Intake/Output Summary (Last 24 hours) at 10/21/14 1218 Last data filed at 10/21/14 0830  Gross per 24 hour  Intake    480 ml  Output   1500 ml  Net  -1020 ml    Exam VITAL SIGNS: Blood pressure 110/77, pulse 89, temperature 98 F (36.7 C), temperature source Oral, resp. rate 20, height 5\' 4"  (1.626 m), weight 70.67 kg (155 lb 12.8 oz), SpO2 100 %.  GENERAL:  54 y.o.-year-old patient lying in the bed with no acute distress.  EYES: Pupils equal, round, reactive to light and accommodation. No scleral icterus. Extraocular muscles intact.  HEENT: Head atraumatic, normocephalic. Oropharynx and nasopharynx clear.  NECK:  Supple, no jugular venous  distention. No thyroid enlargement, no tenderness.  LUNGS: Normal breath sounds bilaterally, no wheezing, rales,rhonchi or crepitation. No use of accessory muscles of respiration.  CARDIOVASCULAR: S1, S2 normal. No murmurs, rubs, or gallops.  ABDOMEN: Soft, nontender, nondistended. Bowel sounds present. No organomegaly or mass.  EXTREMITIES: 3+ pedal edema, cyanosis, or clubbing.  NEUROLOGIC: Cranial nerves II through XII are intact. Muscle strength 5/5 in all extremities. Sensation intact. Gait not checked.  PSYCHIATRIC: The patient is alert and oriented x 3.  SKIN: No obvious rash, lesion, or ulcer.   Data Review     CBC w Diff: Lab Results  Component Value Date   WBC 9.1 10/18/2014   WBC 15.6* 04/28/2014   HGB 10.0* 10/18/2014   HGB 10.3* 04/25/2014   HCT 30.5* 10/18/2014   HCT 31.3* 04/25/2014   PLT 496* 10/18/2014   PLT 281 04/25/2014   LYMPHOPCT 5.1 04/25/2014   LYMPHOPCT 4* 01/31/2014   MONOPCT 5.3 04/25/2014   MONOPCT 2* 01/31/2014   EOSPCT 0.0 04/25/2014   EOSPCT 0 01/31/2014   BASOPCT 0.4 04/25/2014   BASOPCT 0 01/31/2014   CMP: Lab Results  Component Value Date   NA 138 10/21/2014   NA 143 09/11/2014   NA 141 04/28/2014   K 3.1* 10/21/2014   K 3.5 04/28/2014   CL 95* 10/21/2014   CL 102 04/28/2014   CO2 32 10/21/2014   CO2 34* 04/28/2014   BUN 31* 10/21/2014   BUN 55* 09/11/2014  BUN 47* 04/28/2014   CREATININE 3.12* 10/21/2014   CREATININE 1.64* 04/28/2014   CREATININE 0.98 11/30/2012   PROT 6.2* 10/18/2014   PROT 7.1 01/31/2014   ALBUMIN 2.6* 10/18/2014   ALBUMIN 3.7 01/31/2014   BILITOT 0.5 10/18/2014   BILITOT 0.5 01/31/2014   ALKPHOS 97 10/18/2014   ALKPHOS 80 01/31/2014   AST 23 10/18/2014   AST 16 01/31/2014   ALT 10* 10/18/2014   ALT 20 01/31/2014  .  Micro Results Recent Results (from the past 240 hour(s))  Urine culture     Status: None   Collection Time: 10/19/14 12:17 PM  Result Value Ref Range Status   Specimen Description  URINE, RANDOM  Final   Special Requests Normal  Final   Culture MULTIPLE SPECIES PRESENT, SUGGEST RECOLLECTION  Final   Report Status 10/20/2014 FINAL  Final        Code Status Orders        Start     Ordered   10/18/14 2006  Do not attempt resuscitation (DNR)   Continuous    Question Answer Comment  In the event of cardiac or respiratory ARREST Do not call a "code blue"   In the event of cardiac or respiratory ARREST Do not perform Intubation, CPR, defibrillation or ACLS   In the event of cardiac or respiratory ARREST Use medication by any route, position, wound care, and other measures to relive pain and suffering. May use oxygen, suction and manual treatment of airway obstruction as needed for comfort.      10/18/14 2005          Follow-up Information    Follow up with Dennison Mascot, MD In 7 days.   Specialty:  Family Medicine   Contact information:   [address] [place] [address] [phone]       Follow up with Lamont Dowdy, MD In 7 days.   Specialty:  Internal Medicine   Contact information:   [address] [place] [address] [phone]       Discharge Medications     Medication List    STOP taking these medications        metolazone 5 MG tablet  Commonly known as:  ZAROXOLYN     Potassium Chloride ER 20 MEQ Tbcr      TAKE these medications        albuterol 108 (90 BASE) MCG/ACT inhaler  Commonly known as:  PROVENTIL HFA;VENTOLIN HFA  Inhale 2 puffs into the lungs every 6 (six) hours as needed for wheezing or shortness of breath.     aspirin 81 MG EC tablet  Take 1 tablet (81 mg total) by mouth daily.     cloNIDine 0.1 MG tablet  Commonly known as:  CATAPRES  Take 1 tablet (0.1 mg total) by mouth 2 (two) times daily.     cyclobenzaprine 10 MG tablet  Commonly known as:  FLEXERIL  Take 10 mg by mouth daily as needed for muscle spasms.     doxazosin 8 MG tablet  Commonly known as:   CARDURA  Take 1 tablet (8 mg total) by mouth 2 (two) times daily.     fluconazole 150 MG tablet  Commonly known as:  DIFLUCAN  Take 150 mg by mouth as needed (thrush. takes prn).     guaifenesin 400 MG Tabs tablet  Commonly known as:  MUCUS RELIEF  Take 1 tablet (400 mg total) by mouth every 6 (six) hours as  needed.     hydrALAZINE 100 MG tablet  Commonly known as:  APRESOLINE  Take 1 tablet (100 mg total) by mouth 3 (three) times daily.     labetalol 300 MG tablet  Commonly known as:  NORMODYNE  take 1 tablet by mouth three times a day     levofloxacin 500 MG tablet  Commonly known as:  LEVAQUIN  Take 1 tablet (500 mg total) by mouth daily.     LINZESS 145 MCG Caps capsule  Generic drug:  Linaclotide  Take 145 mcg by mouth daily.     montelukast 10 MG tablet  Commonly known as:  SINGULAIR  Take 10 mg by mouth at bedtime.     morphine 15 MG 12 hr tablet  Commonly known as:  MS CONTIN  Take 1 tablet (15 mg total) by mouth every 12 (twelve) hours.     multivitamin with minerals Tabs tablet  Take 1 tablet by mouth daily.     nitroGLYCERIN 0.4 MG SL tablet  Commonly known as:  NITROSTAT  Place 1 tablet (0.4 mg total) under the tongue every 5 (five) minutes as needed for chest pain.     ondansetron 8 MG tablet  Commonly known as:  ZOFRAN  Take 8 mg by mouth every 8 (eight) hours as needed for nausea or vomiting.     Oxycodone HCl 20 MG Tabs  Take 1 tablet (20 mg total) by mouth 3 (three) times daily.     potassium chloride SA 20 MEQ tablet  Commonly known as:  K-DUR,KLOR-CON  Take 1 tablet (20 mEq total) by mouth daily.     ranitidine 75 MG tablet  Commonly known as:  ZANTAC  Take 150 mg by mouth daily.     torsemide 20 MG tablet  Commonly known as:  DEMADEX  Take 1 tablet (20 mg total) by mouth 2 (two) times daily. Please take as directed.           Total Time in preparing paper work, data evaluation and todays exam - 35 minutes  Auburn Bilberry M.D on  10/21/2014 at 12:18 PM  [facility]   Office  [phone]

## 2014-10-21 NOTE — Discharge Instructions (Signed)
°  DIET:  Renal diet  DISCHARGE CONDITION:  Stable  ACTIVITY:  Activity as tolerated  OXYGEN:  Home Oxygen: No.   Oxygen Delivery: room air  DISCHARGE LOCATION:  home    ADDITIONAL DISCHARGE INSTRUCTION: f/u nephrology    If you experience worsening of your admission symptoms, develop shortness of breath, life threatening emergency, suicidal or homicidal thoughts you must seek medical attention immediately by calling 911 or calling your MD immediately  if symptoms less severe.  You Must read complete instructions/literature along with all the possible adverse reactions/side effects for all the Medicines you take and that have been prescribed to you. Take any new Medicines after you have completely understood and accpet all the possible adverse reactions/side effects.   Please note  You were cared for by a hospitalist during your hospital stay. If you have any questions about your discharge medications or the care you received while you were in the hospital after you are discharged, you can call the unit and asked to speak with the hospitalist on call if the hospitalist that took care of you is not available. Once you are discharged, your primary care physician will handle any further medical issues. Please note that NO REFILLS for any discharge medications will be authorized once you are discharged, as it is imperative that you return to your primary care physician (or establish a relationship with a primary care physician if you do not have one) for your aftercare needs so that they can reassess your need for medications and monitor your lab values.

## 2014-10-21 NOTE — Progress Notes (Signed)
[facility]  ROUNDING NOTE   Subjective:   Patient states her edema has improved some. UOP recorded at .  Torsemide 20mg  daily, she takes bid at home.   Objective:  Vital signs in last 24 hours:  Temp:  [97.6 F (36.4 C)-98.4 F (36.9 C)] 98.4 F (36.9 C) (07/30 0347) Pulse Rate:  [76-88] 76 (07/30 0347) Resp:  [17] 17 (07/29 1148) BP: (101-113)/(62-79) 102/78 mmHg (07/30 0747) SpO2:  [98 %] 98 % (07/30 0347) Weight:  [70.67 kg (155 lb 12.8 oz)] 70.67 kg (155 lb 12.8 oz) (07/30 0500)  Weight change: -1.724 kg (-3 lb 12.8 oz) Filed Weights   10/19/14 0500 10/20/14 0502 10/21/14 0500  Weight: 71.26 kg (157 lb 1.6 oz) 72.394 kg (159 lb 9.6 oz) 70.67 kg (155 lb 12.8 oz)    Intake/Output: I/O last 3 completed shifts: In: 480 [P.O.:480] Out: 2300 [Urine:2300]   Intake/Output this shift:     Physical Exam: General: NAD  Head: Normocephalic, atraumatic. Moist oral mucosal membranes  Eyes: Anicteric, PERRL  Neck: Supple, trachea midline  Lungs:  Clear to auscultation  Heart: Regular rate and rhythm  Abdomen:  Soft, nontender,   Extremities:  + peripheral edema.  Neurologic: Nonfocal, moving all four extremities  Skin: No lesions       Basic Metabolic Panel:  Recent Labs Lab 10/18/14 1205 10/18/14 2057 10/19/14 0411 10/19/14 1015 10/20/14 0506 10/21/14 0459  NA 135  --  141  --  138 138  K 2.6* 3.0* 3.5  --  3.2* 3.1*  CL 94*  --  100*  --  98* 95*  CO2 27  --  30  --  30 32  GLUCOSE 138*  --  89  --  78 88  BUN 33*  --  34*  --  35* 31*  CREATININE 3.36*  --  3.60*  --  3.18* 3.12*  CALCIUM 8.1*  --  8.3*  --  8.1* 8.5*  MG 1.6*  --   --   --   --   --   PHOS  --   --   --  3.5  --   --     Liver Function Tests:  Recent Labs Lab 10/18/14 1205  AST 23  ALT 10*  ALKPHOS 97  BILITOT 0.5  PROT 6.2*  ALBUMIN 2.6*   No results for input(s): LIPASE, AMYLASE in the last 168 hours. No results for input(s): AMMONIA in the last 168  hours.  CBC:  Recent Labs Lab 10/18/14 1205  WBC 9.1  HGB 10.0*  HCT 30.5*  MCV 87.5  PLT 496*    Cardiac Enzymes: No results for input(s): CKTOTAL, CKMB, CKMBINDEX, TROPONINI in the last 168 hours.  BNP: Invalid input(s): POCBNP  CBG:  Recent Labs Lab 10/18/14 1748 10/18/14 2036 10/19/14 0755 10/19/14 1111 10/19/14 1707  GLUCAP 103* 111* 126* 131* 106*    Microbiology: Results for orders placed or performed during the hospital encounter of 10/18/14  Urine culture     Status: None   Collection Time: 10/19/14 12:17 PM  Result Value Ref Range Status   Specimen Description URINE, RANDOM  Final   Special Requests Normal  Final   Culture MULTIPLE SPECIES PRESENT, SUGGEST RECOLLECTION  Final   Report Status 10/20/2014 FINAL  Final    Coagulation Studies:  Recent Labs  10/18/14 1205  LABPROT 14.6  INR 1.12    Urinalysis:  Recent Labs  10/18/14 1615  COLORURINE YELLOW*  LABSPEC  1.006  PHURINE 5.0  GLUCOSEU NEGATIVE  HGBUR NEGATIVE  BILIRUBINUR NEGATIVE  KETONESUR NEGATIVE  PROTEINUR NEGATIVE  NITRITE NEGATIVE  LEUKOCYTESUR 2+*      Imaging: No results found.   Medications:     . aspirin EC  81 mg Oral Daily  . doxazosin  8 mg Oral BID  . famotidine  20 mg Oral Daily  . heparin  5,000 Units Subcutaneous 3 times per day  . labetalol  300 mg Oral TID  . Linaclotide  145 mcg Oral Daily  . magnesium oxide  400 mg Oral Daily  . montelukast  10 mg Oral QHS  . morphine  15 mg Oral Q12H  . potassium chloride  20 mEq Oral Daily  . sodium chloride  3 mL Intravenous Q12H  . torsemide  20 mg Oral BID   acetaminophen **OR** acetaminophen, albuterol, bisacodyl, cyclobenzaprine, nitroGLYCERIN, ondansetron **OR** ondansetron (ZOFRAN) IV, oxyCODONE  Assessment/ Plan:  Lisa Leblanc is a 54 y.o. black  female with long-standing type 2 diabetes insulin dependent, diabetic chronic kidney disease, hypertension, history of aortic dissection back  in November 2015, chronic back pain, systolic and diastolic congestive heart failure, atherosclerosis, narcotic dependence  1. Acute renal failure: on chronic kidney disease stage III: Basline creatinine of 1.87 from June 2016 On July 21 creatinine of 6.89 which is a significant increase from 2.23 noted on July 14. Patient was discharged from [facility] on July 14. no other lab results are available in between July 14 to July 21. Creatinine has now improved to 3.12. Suspect patient has acute cardiorenal syndrome and will benefit from diuretics.  - no real indication for renal vascular disease or glomerulonephritis.  - no acute indication for dialysis at this time.  - will need close outpatient follow up  2. Diastolic Congestive Heart Failure: with peripheral edema. Acute exacerbation. - echo reviewed with patient - restarted torsemide  bid.   3. Hypertension: well controlled.  - doxazosin, labetolol     LOS: 3 Lior Hoen 7/30/20169:43 AM

## 2014-10-21 NOTE — Progress Notes (Signed)
Discharge instructions along with home medication list and  Follow up  Gone over with patient. Patient verbalized that she understood instructions. dnr form given to patient. Telemetry removed. Printed rx given to patient. Patient to be discharged home on ra. No distress noted. Family will transport patient [employer]

## 2014-10-25 ENCOUNTER — Ambulatory Visit: Payer: No Typology Code available for payment source | Admitting: Family Medicine

## 2014-11-22 ENCOUNTER — Telehealth: Payer: Self-pay | Admitting: Family Medicine

## 2014-11-22 MED ORDER — FLUCONAZOLE 150 MG PO TABS: 150.0000 mg | ORAL_TABLET | ORAL | Status: DC | PRN

## 2014-11-22 NOTE — Telephone Encounter (Signed)
Called pt to make her aware that RX was called in but voice mail is full.

## 2014-11-22 NOTE — Telephone Encounter (Signed)
Sent to pharmacy 

## 2014-11-22 NOTE — Telephone Encounter (Signed)
Pt would like a prescription for Diflucan to be sent to [facility]. She has thrush in her mouth.

## 2014-11-23 ENCOUNTER — Ambulatory Visit (INDEPENDENT_AMBULATORY_CARE_PROVIDER_SITE_OTHER): Payer: No Typology Code available for payment source | Admitting: Family Medicine

## 2014-11-23 ENCOUNTER — Encounter: Payer: Self-pay | Admitting: Family Medicine

## 2014-11-23 VITALS — BP 116/68 | HR 78 | Temp 98.1°F | Resp 16 | Ht 64.0 in | Wt 138.4 lb

## 2014-11-23 DIAGNOSIS — I7100 Dissection of unspecified site of aorta: Secondary | ICD-10-CM | POA: Diagnosis not present

## 2014-11-23 DIAGNOSIS — IMO0001 Reserved for inherently not codable concepts without codable children: Secondary | ICD-10-CM

## 2014-11-23 DIAGNOSIS — J449 Chronic obstructive pulmonary disease, unspecified: Secondary | ICD-10-CM | POA: Diagnosis not present

## 2014-11-23 DIAGNOSIS — G8929 Other chronic pain: Secondary | ICD-10-CM

## 2014-11-23 DIAGNOSIS — I7090 Unspecified atherosclerosis: Secondary | ICD-10-CM

## 2014-11-23 DIAGNOSIS — B370 Candidal stomatitis: Secondary | ICD-10-CM

## 2014-11-23 DIAGNOSIS — Z720 Tobacco use: Secondary | ICD-10-CM | POA: Diagnosis not present

## 2014-11-23 DIAGNOSIS — I5022 Chronic systolic (congestive) heart failure: Secondary | ICD-10-CM | POA: Diagnosis not present

## 2014-11-23 DIAGNOSIS — J189 Pneumonia, unspecified organism: Secondary | ICD-10-CM | POA: Diagnosis not present

## 2014-11-23 DIAGNOSIS — J810 Acute pulmonary edema: Secondary | ICD-10-CM | POA: Insufficient documentation

## 2014-11-23 MED ORDER — LIDOCAINE 5 % EX PTCH: 1.0000 | MEDICATED_PATCH | CUTANEOUS | Status: DC

## 2014-11-23 MED ORDER — PREDNISONE 20 MG PO TABS: 20.0000 mg | ORAL_TABLET | Freq: Every day | ORAL | Status: DC
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
## 2014-11-30 ENCOUNTER — Ambulatory Visit: Payer: No Typology Code available for payment source | Admitting: Family Medicine

## 2014-12-01 IMAGING — CR DG ABDOMEN ACUTE W/ 1V CHEST
3 series · 3 of 3 positions shown · non-contrast
Comparison: Prior abdominal radiographs 11/20/2012

CLINICAL DATA: Abdominal pain

ACUTE ABDOMEN SERIES (ABDOMEN 2 VIEW & CHEST 1 VIEW)

[w chest pa]
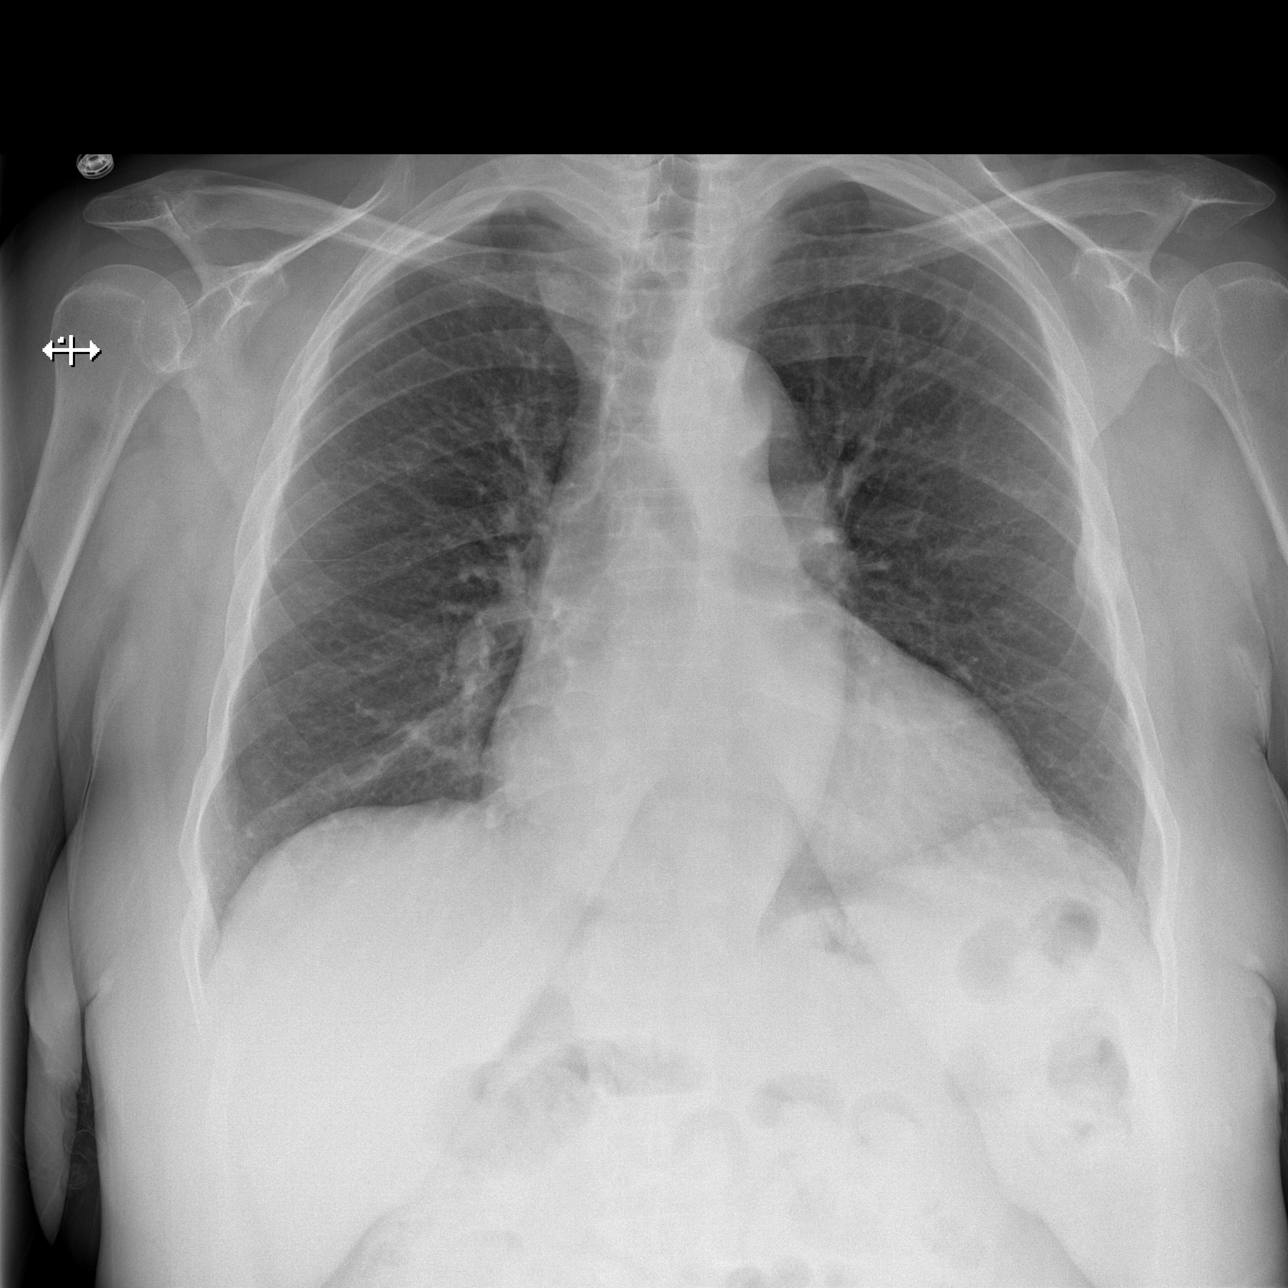

[w abdomen upright]
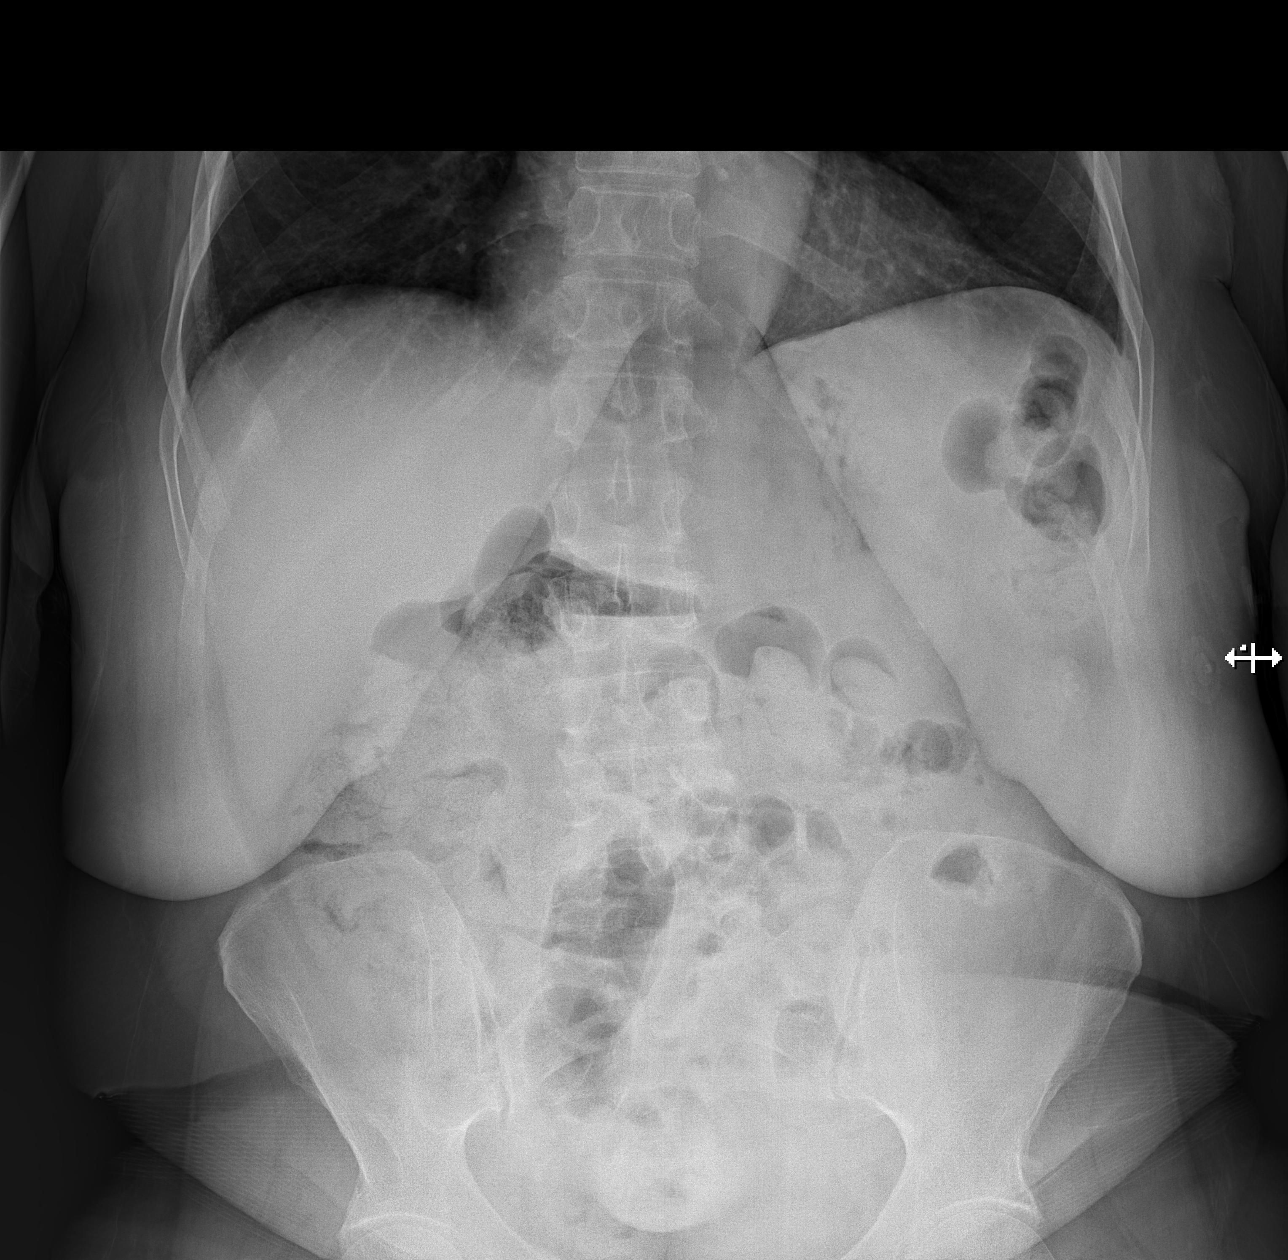

[t abdomen supine]
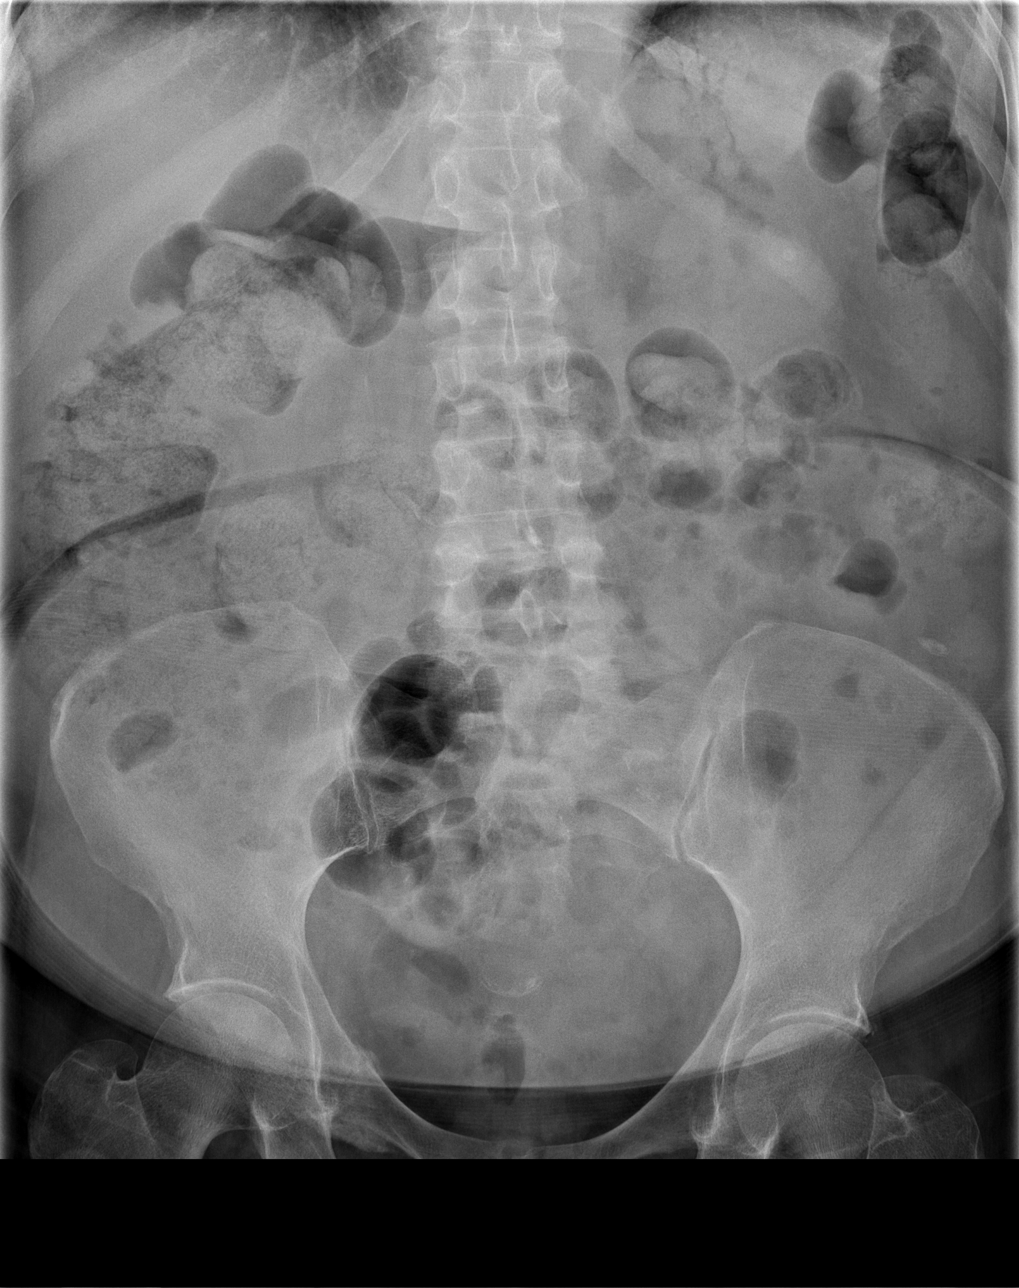

[3 of 3 positions shown; findings below may reference images not displayed]

FINDINGS: Stable cardiomegaly.  There is trace atherosclerotic
calcification of the transverse aorta.  Interval development of a
subpleural density along the lateral wall of the left thorax which
measures approximately 2 cm in length.  Mild central bronchitic
changes and prominence of interstitial markings similar to previous
studies. No pleural effusion or pneumothorax.

Unremarkable, nonobstructed bowel gas pattern.  Gas and stool noted
throughout the colon to the level of the rectum.  No dilatation.
No large free air.  Healed right superior and inferior pubic rami
fractures.  No acute osseous abnormality.
IMPRESSION: 1.  No acute cardiopulmonary process.
2.  Unremarkable, nonobstructed bowel gas pattern.
3.  Subpleural density along the lateral aspect of the left chest
wall is nonspecific and may represent callus around a healing
lateral rib fracture, or a subpleural nodule/mass.  If there is no
clinical history of prior left-sided rib fracture, consider further
evaluation with CT scan of the chest to further evaluate.

## 2014-12-04 ENCOUNTER — Ambulatory Visit: Payer: No Typology Code available for payment source | Admitting: Family Medicine

## 2014-12-05 ENCOUNTER — Ambulatory Visit (INDEPENDENT_AMBULATORY_CARE_PROVIDER_SITE_OTHER): Payer: No Typology Code available for payment source | Admitting: Family Medicine

## 2014-12-05 ENCOUNTER — Encounter: Payer: Self-pay | Admitting: Family Medicine

## 2014-12-05 VITALS — BP 126/78 | Temp 97.8°F | Resp 16 | Ht 64.0 in | Wt 124.0 lb

## 2014-12-05 DIAGNOSIS — G8929 Other chronic pain: Secondary | ICD-10-CM | POA: Diagnosis not present

## 2014-12-05 DIAGNOSIS — I7100 Dissection of unspecified site of aorta: Secondary | ICD-10-CM | POA: Diagnosis not present

## 2014-12-05 DIAGNOSIS — N184 Chronic kidney disease, stage 4 (severe): Secondary | ICD-10-CM

## 2014-12-05 DIAGNOSIS — N179 Acute kidney failure, unspecified: Secondary | ICD-10-CM | POA: Diagnosis not present

## 2014-12-05 DIAGNOSIS — Z23 Encounter for immunization: Secondary | ICD-10-CM

## 2014-12-05 DIAGNOSIS — M4850XG Collapsed vertebra, not elsewhere classified, site unspecified, subsequent encounter for fracture with delayed healing: Secondary | ICD-10-CM

## 2014-12-05 DIAGNOSIS — R0781 Pleurodynia: Secondary | ICD-10-CM

## 2014-12-05 DIAGNOSIS — I2510 Atherosclerotic heart disease of native coronary artery without angina pectoris: Secondary | ICD-10-CM

## 2014-12-05 DIAGNOSIS — IMO0001 Reserved for inherently not codable concepts without codable children: Secondary | ICD-10-CM

## 2014-12-05 MED ORDER — OXYCODONE HCL 20 MG PO TABS: 20.0000 mg | ORAL_TABLET | Freq: Three times a day (TID) | ORAL | Status: DC

## 2014-12-05 MED ORDER — MORPHINE SULFATE ER 15 MG PO TBCR: 15.0000 mg | EXTENDED_RELEASE_TABLET | Freq: Two times a day (BID) | ORAL | Status: DC

## 2014-12-05 MED ORDER — CYCLOBENZAPRINE HCL 10 MG PO TABS: 10.0000 mg | ORAL_TABLET | Freq: Three times a day (TID) | ORAL | Status: DC | PRN
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
## 2014-12-05 NOTE — Patient Instructions (Signed)
Referral to nephrologist. 

## 2014-12-12 ENCOUNTER — Other Ambulatory Visit
Admission: RE | Admit: 2014-12-12 | Discharge: 2014-12-12 | Disposition: A | Discharge status: Home / Self Care | Payer: No Typology Code available for payment source | Source: Ambulatory Visit | Attending: Family Medicine | Admitting: Family Medicine

## 2014-12-12 ENCOUNTER — Other Ambulatory Visit: Payer: Self-pay | Admitting: Emergency Medicine

## 2014-12-12 DIAGNOSIS — N184 Chronic kidney disease, stage 4 (severe): Secondary | ICD-10-CM | POA: Diagnosis not present

## 2014-12-12 LAB — COMPREHENSIVE METABOLIC PANEL
ALBUMIN: 3.1 g/dL — AB (ref 3.5–5.0)
ALT: 17 U/L (ref 14–54)
AST: 25 U/L (ref 15–41)
Alkaline Phosphatase: 103 U/L (ref 38–126)
Anion gap: 14 (ref 5–15)
BUN: 55 mg/dL — AB (ref 6–20)
CHLORIDE: 90 mmol/L — AB (ref 101–111)
CO2: 29 mmol/L (ref 22–32)
CREATININE: 3.63 mg/dL — AB (ref 0.44–1.00)
Calcium: 8.2 mg/dL — ABNORMAL LOW (ref 8.9–10.3)
GFR calc Af Amer: 15 mL/min — ABNORMAL LOW (ref >60)
GFR, EST NON AFRICAN AMERICAN: 13 mL/min — AB (ref >60)
GLUCOSE: 116 mg/dL — AB (ref 65–99)
POTASSIUM: 2.6 mmol/L — AB (ref 3.5–5.1)
Sodium: 133 mmol/L — ABNORMAL LOW (ref 135–145)
Total Bilirubin: 0.7 mg/dL (ref 0.3–1.2)
Total Protein: 6.4 g/dL — ABNORMAL LOW (ref 6.5–8.1)

## 2014-12-12 NOTE — Telephone Encounter (Signed)
Spoke to patient, she stated she had not taken any potassium medication in a few days. Patient was informed of directions and stated she will try to get meds

## 2014-12-12 NOTE — Telephone Encounter (Signed)
Script called to [facility]. Trying to contact patient, phone will not accept messages.

## 2014-12-12 NOTE — Telephone Encounter (Signed)
Hospital lab called Lisa Leblanc. Patient Potassium low at 2.6. Spoke to Dr. Carlynn Purl. Patient need to take Potassium 40 meq twice a day for today and tomorrow and then make sure she take Potassium every day as long as she is taking Torsemide and recheck on Friday. Then go to 40 meq qd. Script called to pharmacy and will contact patient

## 2014-12-18 NOTE — Telephone Encounter (Signed)
Have tried to call patient several times and phone not available. Patient received results of potassium low on 12/12/2014. A letter was sent to inform her to call office to make sure she follow up with Nephrology and to inform her to use Boost or ensure

## 2014-12-20 ENCOUNTER — Telehealth: Payer: Self-pay | Admitting: *Deleted

## 2014-12-20 NOTE — Telephone Encounter (Signed)
Pt c/o of Chest Pain: STAT if CP now or developed within 24 hours  1. Are you having CP right now? Not right now took 2 nitro   2. Are you experiencing any other symptoms (ex. SOB, nausea, vomiting, sweating)? No   3. How long have you been experiencing CP?  Going on for a week but more this week   4. Is your CP continuous or coming and going? Coming and going   5. Have you taken Nitroglycerin? Yes  ?

## 2014-12-20 NOTE — Telephone Encounter (Signed)
Pt sched to see Eula Listen, PA on Fri @ 2:00. Advised her to proceed to ED or call 911 if her sx become emergent.

## 2014-12-22 ENCOUNTER — Encounter: Payer: Self-pay | Admitting: Physician Assistant

## 2014-12-22 ENCOUNTER — Other Ambulatory Visit
Admission: RE | Admit: 2014-12-22 | Discharge: 2014-12-22 | Disposition: A | Discharge status: Home / Self Care | Payer: No Typology Code available for payment source | Source: Ambulatory Visit | Attending: Physician Assistant | Admitting: Physician Assistant

## 2014-12-22 ENCOUNTER — Other Ambulatory Visit: Payer: Self-pay

## 2014-12-22 ENCOUNTER — Ambulatory Visit (INDEPENDENT_AMBULATORY_CARE_PROVIDER_SITE_OTHER): Payer: No Typology Code available for payment source | Admitting: Physician Assistant

## 2014-12-22 VITALS — BP 122/90 | HR 87 | Ht 64.0 in | Wt 144.5 lb

## 2014-12-22 DIAGNOSIS — I5032 Chronic diastolic (congestive) heart failure: Secondary | ICD-10-CM

## 2014-12-22 DIAGNOSIS — R0602 Shortness of breath: Secondary | ICD-10-CM | POA: Diagnosis not present

## 2014-12-22 DIAGNOSIS — F17200 Nicotine dependence, unspecified, uncomplicated: Secondary | ICD-10-CM

## 2014-12-22 DIAGNOSIS — G8929 Other chronic pain: Secondary | ICD-10-CM

## 2014-12-22 DIAGNOSIS — W19XXXA Unspecified fall, initial encounter: Secondary | ICD-10-CM

## 2014-12-22 DIAGNOSIS — R079 Chest pain, unspecified: Secondary | ICD-10-CM

## 2014-12-22 DIAGNOSIS — E7800 Pure hypercholesterolemia, unspecified: Secondary | ICD-10-CM

## 2014-12-22 DIAGNOSIS — M546 Pain in thoracic spine: Secondary | ICD-10-CM

## 2014-12-22 DIAGNOSIS — I159 Secondary hypertension, unspecified: Secondary | ICD-10-CM | POA: Insufficient documentation

## 2014-12-22 DIAGNOSIS — N184 Chronic kidney disease, stage 4 (severe): Secondary | ICD-10-CM

## 2014-12-22 LAB — BASIC METABOLIC PANEL
Anion gap: 10 (ref 5–15)
BUN: 40 mg/dL — AB (ref 6–20)
CALCIUM: 8.7 mg/dL — AB (ref 8.9–10.3)
CO2: 24 mmol/L (ref 22–32)
Chloride: 102 mmol/L (ref 101–111)
Creatinine, Ser: 2.91 mg/dL — ABNORMAL HIGH (ref 0.44–1.00)
GFR calc Af Amer: 20 mL/min — ABNORMAL LOW (ref >60)
GFR, EST NON AFRICAN AMERICAN: 17 mL/min — AB (ref >60)
Glucose, Bld: 119 mg/dL — ABNORMAL HIGH (ref 65–99)
POTASSIUM: 5.4 mmol/L — AB (ref 3.5–5.1)
SODIUM: 136 mmol/L (ref 135–145)

## 2014-12-22 MED ORDER — LIDOCAINE 5 % EX PTCH: 1.0000 | MEDICATED_PATCH | CUTANEOUS | Status: DC

## 2014-12-22 MED ORDER — TORSEMIDE 20 MG PO TABS: 20.0000 mg | ORAL_TABLET | Freq: Every day | ORAL | Status: AC

## 2014-12-22 MED ORDER — MONTELUKAST SODIUM 10 MG PO TABS: 10.0000 mg | ORAL_TABLET | Freq: Every day | ORAL | Status: DC

## 2014-12-22 NOTE — Patient Instructions (Signed)
Medication Instructions:  Your physician has recommended you make the following change in your medication:  STOP taking cardura/doxazosin STOP taking hydralazine DECREASE torsemide to 20mg  once per day TAKE potassium 40 once per day   Labwork: Your physician recommends that you have labs today: BMET   Testing/Procedures: none  Follow-Up: Your physician recommends that you schedule a follow-up appointment in: three months with Eula Listen, PA-C or Dr. Mariah Milling   Any Other Special Instructions Will Be Listed Below (If Applicable).
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
## 2014-12-23 ENCOUNTER — Telehealth: Payer: Self-pay | Admitting: Physician Assistant

## 2014-12-23 NOTE — Telephone Encounter (Signed)
I told at her OV on 9/30 I would call her today with her lab results. See result note on bmet. No answer and VM full. Will attempt again on Monday, 10/3.   Eula Listen, PA-C 12/23/2014 9:48 AM

## 2014-12-25 ENCOUNTER — Other Ambulatory Visit: Payer: Self-pay

## 2014-12-25 DIAGNOSIS — I159 Secondary hypertension, unspecified: Secondary | ICD-10-CM

## 2014-12-26 ENCOUNTER — Other Ambulatory Visit
Admission: RE | Admit: 2014-12-26 | Discharge: 2014-12-26 | Disposition: A | Discharge status: Home / Self Care | Payer: No Typology Code available for payment source | Source: Ambulatory Visit | Attending: Physician Assistant | Admitting: Physician Assistant

## 2014-12-26 DIAGNOSIS — I159 Secondary hypertension, unspecified: Secondary | ICD-10-CM | POA: Insufficient documentation

## 2014-12-26 LAB — BASIC METABOLIC PANEL
ANION GAP: 9 (ref 5–15)
BUN: 50 mg/dL — ABNORMAL HIGH (ref 6–20)
CALCIUM: 8.5 mg/dL — AB (ref 8.9–10.3)
CHLORIDE: 103 mmol/L (ref 101–111)
CO2: 27 mmol/L (ref 22–32)
Creatinine, Ser: 2.39 mg/dL — ABNORMAL HIGH (ref 0.44–1.00)
GFR calc non Af Amer: 22 mL/min — ABNORMAL LOW (ref >60)
GFR, EST AFRICAN AMERICAN: 25 mL/min — AB (ref >60)
GLUCOSE: 145 mg/dL — AB (ref 65–99)
Potassium: 3.6 mmol/L (ref 3.5–5.1)
Sodium: 139 mmol/L (ref 135–145)

## 2014-12-27 ENCOUNTER — Other Ambulatory Visit: Payer: Self-pay

## 2014-12-27 DIAGNOSIS — I159 Secondary hypertension, unspecified: Secondary | ICD-10-CM

## 2015-01-05 ENCOUNTER — Encounter: Payer: Self-pay | Admitting: Family Medicine

## 2015-01-05 ENCOUNTER — Ambulatory Visit (INDEPENDENT_AMBULATORY_CARE_PROVIDER_SITE_OTHER): Payer: No Typology Code available for payment source | Admitting: Family Medicine

## 2015-01-05 VITALS — BP 142/96 | HR 63 | Temp 97.6°F | Resp 16 | Ht 64.0 in | Wt 147.4 lb

## 2015-01-05 DIAGNOSIS — R0689 Other abnormalities of breathing: Secondary | ICD-10-CM | POA: Diagnosis not present

## 2015-01-05 DIAGNOSIS — I71019 Dissection of thoracic aorta, unspecified: Secondary | ICD-10-CM

## 2015-01-05 DIAGNOSIS — R600 Localized edema: Secondary | ICD-10-CM | POA: Insufficient documentation

## 2015-01-05 DIAGNOSIS — I890 Lymphedema, not elsewhere classified: Secondary | ICD-10-CM

## 2015-01-05 DIAGNOSIS — R609 Edema, unspecified: Secondary | ICD-10-CM | POA: Diagnosis not present

## 2015-01-05 DIAGNOSIS — I7101 Dissection of thoracic aorta: Secondary | ICD-10-CM

## 2015-01-05 DIAGNOSIS — R0600 Dyspnea, unspecified: Secondary | ICD-10-CM | POA: Diagnosis not present

## 2015-01-05 DIAGNOSIS — I5043 Acute on chronic combined systolic (congestive) and diastolic (congestive) heart failure: Secondary | ICD-10-CM

## 2015-01-05 DIAGNOSIS — I509 Heart failure, unspecified: Secondary | ICD-10-CM | POA: Diagnosis not present

## 2015-01-05 MED ORDER — PREDNISONE 20 MG PO TABS: 20.0000 mg | ORAL_TABLET | Freq: Two times a day (BID) | ORAL | Status: DC
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
## 2015-01-08 ENCOUNTER — Encounter: Payer: Self-pay | Admitting: Family Medicine

## 2015-01-11 ENCOUNTER — Encounter: Payer: Self-pay | Admitting: Family

## 2015-01-11 ENCOUNTER — Ambulatory Visit: Payer: No Typology Code available for payment source | Attending: Family | Admitting: Family

## 2015-01-11 VITALS — BP 160/100 | HR 100 | Resp 20 | Ht 64.0 in | Wt 158.0 lb

## 2015-01-11 DIAGNOSIS — Z8674 Personal history of sudden cardiac arrest: Secondary | ICD-10-CM | POA: Insufficient documentation

## 2015-01-11 DIAGNOSIS — E119 Type 2 diabetes mellitus without complications: Secondary | ICD-10-CM | POA: Insufficient documentation

## 2015-01-11 DIAGNOSIS — M549 Dorsalgia, unspecified: Secondary | ICD-10-CM | POA: Insufficient documentation

## 2015-01-11 DIAGNOSIS — I129 Hypertensive chronic kidney disease with stage 1 through stage 4 chronic kidney disease, or unspecified chronic kidney disease: Secondary | ICD-10-CM | POA: Diagnosis not present

## 2015-01-11 DIAGNOSIS — F17210 Nicotine dependence, cigarettes, uncomplicated: Secondary | ICD-10-CM | POA: Insufficient documentation

## 2015-01-11 DIAGNOSIS — I5032 Chronic diastolic (congestive) heart failure: Secondary | ICD-10-CM | POA: Diagnosis present

## 2015-01-11 DIAGNOSIS — N183 Chronic kidney disease, stage 3 (moderate): Secondary | ICD-10-CM | POA: Diagnosis not present

## 2015-01-11 DIAGNOSIS — M545 Low back pain, unspecified: Secondary | ICD-10-CM

## 2015-01-11 DIAGNOSIS — E7800 Pure hypercholesterolemia, unspecified: Secondary | ICD-10-CM | POA: Insufficient documentation

## 2015-01-11 DIAGNOSIS — Z7982 Long term (current) use of aspirin: Secondary | ICD-10-CM | POA: Insufficient documentation

## 2015-01-11 DIAGNOSIS — G8929 Other chronic pain: Secondary | ICD-10-CM | POA: Diagnosis not present

## 2015-01-11 DIAGNOSIS — I429 Cardiomyopathy, unspecified: Secondary | ICD-10-CM | POA: Insufficient documentation

## 2015-01-11 DIAGNOSIS — Z720 Tobacco use: Secondary | ICD-10-CM

## 2015-01-11 DIAGNOSIS — Z79899 Other long term (current) drug therapy: Secondary | ICD-10-CM | POA: Diagnosis not present

## 2015-01-11 DIAGNOSIS — I10 Essential (primary) hypertension: Secondary | ICD-10-CM

## 2015-01-11 NOTE — Patient Instructions (Addendum)
Continue weighing daily and call for an overnight weight gain of > 2 pounds or a weekly weight gain of >5 pounds.  Use heat every few hours as needed after the fall yesterday. Call PCP if pain is no better tomorrow.
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
## 2015-01-17 ENCOUNTER — Ambulatory Visit (INDEPENDENT_AMBULATORY_CARE_PROVIDER_SITE_OTHER): Payer: No Typology Code available for payment source | Admitting: Family Medicine

## 2015-01-17 ENCOUNTER — Encounter: Payer: Self-pay | Admitting: Family Medicine

## 2015-01-17 VITALS — BP 160/98 | HR 89 | Temp 98.5°F | Resp 18 | Ht 64.0 in | Wt 167.9 lb

## 2015-01-17 DIAGNOSIS — M5441 Lumbago with sciatica, right side: Secondary | ICD-10-CM

## 2015-01-17 DIAGNOSIS — M5442 Lumbago with sciatica, left side: Secondary | ICD-10-CM | POA: Diagnosis not present

## 2015-01-17 DIAGNOSIS — I5043 Acute on chronic combined systolic (congestive) and diastolic (congestive) heart failure: Secondary | ICD-10-CM

## 2015-01-17 DIAGNOSIS — I7101 Dissection of thoracic aorta: Secondary | ICD-10-CM

## 2015-01-17 DIAGNOSIS — R600 Localized edema: Secondary | ICD-10-CM

## 2015-01-17 DIAGNOSIS — E1122 Type 2 diabetes mellitus with diabetic chronic kidney disease: Secondary | ICD-10-CM | POA: Diagnosis not present

## 2015-01-17 DIAGNOSIS — R609 Edema, unspecified: Secondary | ICD-10-CM | POA: Diagnosis not present

## 2015-01-17 DIAGNOSIS — N184 Chronic kidney disease, stage 4 (severe): Secondary | ICD-10-CM | POA: Diagnosis not present

## 2015-01-17 DIAGNOSIS — I71019 Dissection of thoracic aorta, unspecified: Secondary | ICD-10-CM

## 2015-01-17 DIAGNOSIS — I130 Hypertensive heart and chronic kidney disease with heart failure and stage 1 through stage 4 chronic kidney disease, or unspecified chronic kidney disease: Secondary | ICD-10-CM | POA: Diagnosis not present

## 2015-01-17 MED ORDER — OXYCODONE HCL 20 MG PO TABS: 20.0000 mg | ORAL_TABLET | Freq: Three times a day (TID) | ORAL | Status: DC

## 2015-01-17 MED ORDER — MORPHINE SULFATE ER 15 MG PO TBCR: 15.0000 mg | EXTENDED_RELEASE_TABLET | Freq: Two times a day (BID) | ORAL | Status: DC
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
## 2015-01-31 ENCOUNTER — Telehealth: Payer: Self-pay | Admitting: *Deleted

## 2015-01-31 NOTE — Telephone Encounter (Signed)
[facility] calling stating pt is in their office and pt is having swelling in Lower Extremity PA from there called stated if we can call patient and just give her the instructions on medications again.  She thinks patient is not taking meds correct.  I also have patient coming in for apt Friday to see Dr Mariah Milling.  Please advise

## 2015-01-31 NOTE — Telephone Encounter (Signed)
Routed pt's last AVS to [facility] so that they can provide pt w/ copy of written instructions while she is in their office.

## 2015-02-02 ENCOUNTER — Other Ambulatory Visit: Payer: Self-pay | Admitting: *Deleted

## 2015-02-02 ENCOUNTER — Encounter: Payer: Self-pay | Admitting: Cardiovascular Disease

## 2015-02-02 ENCOUNTER — Ambulatory Visit (INDEPENDENT_AMBULATORY_CARE_PROVIDER_SITE_OTHER): Payer: No Typology Code available for payment source | Admitting: Cardiovascular Disease

## 2015-02-02 VITALS — BP 122/86 | HR 66 | Ht 64.0 in | Wt 157.0 lb

## 2015-02-02 DIAGNOSIS — I7101 Dissection of thoracic aorta: Secondary | ICD-10-CM

## 2015-02-02 DIAGNOSIS — E1122 Type 2 diabetes mellitus with diabetic chronic kidney disease: Secondary | ICD-10-CM

## 2015-02-02 DIAGNOSIS — R601 Generalized edema: Secondary | ICD-10-CM | POA: Diagnosis not present

## 2015-02-02 DIAGNOSIS — R1900 Intra-abdominal and pelvic swelling, mass and lump, unspecified site: Secondary | ICD-10-CM

## 2015-02-02 DIAGNOSIS — Z01812 Encounter for preprocedural laboratory examination: Secondary | ICD-10-CM

## 2015-02-02 DIAGNOSIS — R109 Unspecified abdominal pain: Secondary | ICD-10-CM

## 2015-02-02 DIAGNOSIS — I429 Cardiomyopathy, unspecified: Secondary | ICD-10-CM

## 2015-02-02 DIAGNOSIS — N184 Chronic kidney disease, stage 4 (severe): Secondary | ICD-10-CM

## 2015-02-02 DIAGNOSIS — R0602 Shortness of breath: Secondary | ICD-10-CM | POA: Diagnosis not present

## 2015-02-02 DIAGNOSIS — I71019 Dissection of thoracic aorta, unspecified: Secondary | ICD-10-CM

## 2015-02-02 DIAGNOSIS — I428 Other cardiomyopathies: Secondary | ICD-10-CM

## 2015-02-02 DIAGNOSIS — R600 Localized edema: Secondary | ICD-10-CM | POA: Insufficient documentation

## 2015-02-02 NOTE — Assessment & Plan Note (Signed)
Managed by primary care. 

## 2015-02-02 NOTE — Patient Instructions (Addendum)
You are doing well. No medication changes were made.  We will order a chest and ABD CT scan, no contrast For right flank pain, ABD swelling, breathing, leg swelling (anasarca) Date & Time: Tuesday, Nov 15 @ 3:30 @ [facility] Please arrive @ 3:15  We will arrange a right heart cath for breathing and severe leg swelling, ABD swelling  [facility] Cardiac Cath Instructions   You are scheduled for a Cardiac Cath on:__Thursday, Nov 17_____  Please arrive at 6:30 am on the day of your procedure  You will need to pre-register prior to the day of your procedure.  Enter through the [employer] at [facility].  Registration is the first desk on your right.  Please take the procedure order we have given you in order to be registered appropriately  Do not eat/drink anything after midnight  Someone will need to drive you home  It is recommended someone be with you for the first 24 hours after your procedure  Wear clothes that are easy to get on/off and wear slip on shoes if possible  Medications bring a current list of all medications with you  _X__ Do not take these medications the morning of your procedure:___Torsemide___   Day of your procedure: Arrive at the [facility] entrance.  Free valet service is available.  After entering the [facility] please check-in at the registration desk (1st desk on your right) to receive your armband. After receiving your armband someone will escort you to the cardiac cath/special procedures waiting area.  The usual length of stay after your procedure is about 2 to 3 hours.  This can vary.  If you have any questions, please call our office at [phone], or you may call the cardiac cath lab at [facility] directly at [phone]  Please call us if you have new issues that need to be addressed before your next appt.   Your physician wants you to follow-up in: 1 month.    CT Scan A computed tomography (CT) scan is a specialized X-ray scan. It uses  X-rays and a computer to make pictures of different areas of your body. A CT scan can offer more detailed information than a regular X-ray exam. The CT scan provides data about internal organs, soft tissue structures, blood vessels, and bones.  The CT scanner is a large machine that takes pictures of your body as you move through the opening.  LET [facility] CARE PROVIDER KNOW ABOUT:  Any allergies you have.   All medicines you are taking, including vitamins, herbs, eye drops, creams, and over-the-counter medicines.   Previous problems you or members of your family have had with the use of anesthetics.   Any blood disorders you have.   Previous surgeries you have had.   Medical conditions you have. RISKS AND COMPLICATIONS  Generally, this is a safe procedure. However, as with any procedure, problems can occur. Possible problems include:   An allergic reaction to the contrast material.   Development of cancer from excessive exposure to radiation. The risk of this is small.  BEFORE THE PROCEDURE   The day before the test, stop drinking caffeinated beverages. These include energy drinks, tea, soda, coffee, and hot chocolate.   On the day of the test:  About 4 hours before the test, stop eating and drinking anything but water as advised by your health care provider.   Avoid wearing jewelry. You will have to partly or fully undress and wear a hospital gown. PROCEDURE  You will be asked to lie on a table with your arms above your head.   If contrast dye is to be used for the test, an IV tube will be inserted in your arm. The contrast dye will be injected into the IV tube. You might feel warm, or you may get a metallic taste in your mouth.   The table you will be lying on will move into a large machine that will do the scanning.   You will be able to see, hear, and talk to the person running the machine while you are in it. Follow that person's directions.   The CT  machine will move around you to take pictures. Do not move while it is scanning. This helps to get a good image.   When the best possible pictures have been taken, the machine will be turned off. The table will be moved out of the machine. The IV tube will then be removed. AFTER THE PROCEDURE  Ask your health care provider when to follow up for your test results.   This information is not intended to replace advice given to you by your health care provider. Make sure you discuss any questions you have with your health care provider.   Document Released: 04/17/2004 Document Revised: 03/15/2013 Document Reviewed: 11/15/2012 Elsevier Interactive Patient Education [employer]   Angiogram An angiogram, also called angiography, is a procedure used to look at the blood vessels. In this procedure, dye is injected through a long, thin tube (catheter) into an artery. X-rays are then taken. The X-rays will show if there is a blockage or problem in a blood vessel.  LET [facility] CARE PROVIDER KNOW ABOUT:  Any allergies you have, including allergies to shellfish or contrast dye.   All medicines you are taking, including vitamins, herbs, eye drops, creams, and over-the-counter medicines.   Previous problems you or members of your family have had with the use of anesthetics.   Any blood disorders you have.   Previous surgeries you have had.  Any previous kidney problems or failure you have had.  Medical conditions you have.   Possibility of pregnancy, if this applies. RISKS AND COMPLICATIONS Generally, an angiogram is a safe procedure. However, as with any procedure, problems can occur. Possible problems include:  Injury to the blood vessels, including rupture or bleeding.  Infection or bruising at the catheter site.  Allergic reaction to the dye or contrast used.  Kidney damage from the dye or contrast used.  Blood clots that can lead to a stroke or heart attack. BEFORE  THE PROCEDURE  Do not eat or drink after midnight on the night before the procedure, or as directed by your health care provider.   Ask your health care provider if you may drink enough water to take any needed medicines the morning of the procedure.  PROCEDURE  You may be given a medicine to help you relax (sedative) before and during the procedure. This medicine is given through an IV access tube that is inserted into one of your veins.   The area where the catheter will be inserted will be washed and shaved. This is usually done in the groin but may be done in the fold of your arm (near your elbow) or in the wrist.  A medicine will be given to numb the area where the catheter will be inserted (local anesthetic).  The catheter will be inserted with a guide wire into an artery. The catheter is  guided by using a type of X-ray (fluoroscopy) to the blood vessel being examined.   Dye is then injected into the catheter, and X-rays are taken. The dye helps to show where any narrowing or blockages are located.  AFTER THE PROCEDURE   If the procedure is done through the leg, you will be kept in bed lying flat for several hours. You will be instructed to not bend or cross your legs.  The insertion site will be checked frequently.  The pulse in your feet or wrist will be checked frequently.  Additional blood tests, X-rays, and electrocardiography may be done.   You may need to stay in the hospital overnight for observation.    This information is not intended to replace advice given to you by your health care provider. Make sure you discuss any questions you have with your health care provider.   Document Released: 12/18/2004 Document Revised: 03/31/2014 Document Reviewed: 08/11/2012 Elsevier Interactive Patient Education [employer]

## 2015-02-02 NOTE — Assessment & Plan Note (Signed)
MRA performed in the past, seen by CT surgery, medical management recommended

## 2015-02-02 NOTE — Progress Notes (Signed)
Patient ID: Lisa Leblanc, female    DOB: 1960/11/30, 54 y.o.   MRN: [id number]  HPI Comments: Lisa Leblanc is a 54 year old woman, patient of Dr. Charlette Caffey, long history of smoking, COPD, malignant hypertension, on chronic opioid management for severe back pain, type 2 diabetes,   hospitalization 05/29/2013 with discharge 05/30/2013. She was admitted with new onset acute systolic heart failure, uncontrolled hypertension,  elevated troponin from demand ischemia. Cardiac arrest during back surgery April 2015 requiring CPR, surgery aborted, extubated 07/01/2013. Severe hypertension at the time.  She presents for follow-up of her hypertension and chronic diastolic CHF   recent echocardiogram July 2016 showing normal LV systolic function, No mention of elevated right heart pressures   on today's visit she reports having severe leg edema, pitting extending up to her thighs.   she has been taking torsemide daily. Elevated creatinine 2.4 with elevated BUN She is scheduled to see renal/nephrology She is smoking one pack per day  Previous MRA aorta showed stable appearance of Type B aortic dissection.  follow up with CT Surgery on 09/12/14  discussed treatment options and felt this area was stable and she would be unlikely to benefit from surgical repair as her symptoms were unlikely to be coming from the dissection  prior admission for pneumonia,   creatinine up to 6, diuretics held   Other past medical history In the hospital at [facility] admitted 01/31/2014, discharge 02/08/2014 with a descending thoracic aorta dissection. She presented with acute back pain, dissection confirmed on MRI extending from beyond the takeoff of the left subclavian distally. Renal arteries were not compromised  In the hospital admission in March 2015, she presented with respiratory distress, placed on BiPAP, had elevated BNP, chest x-ray consistent with heart failure. She was given aggressive diuresis with several liters of  fluid removed. Creatinine climbed and losartan was held. She reports having prior stress test and was told that this was normal. In 2014  Echocardiogram 05/30/2013 shows ejection fraction 30-35%, moderate LVH, normal right ventricular systolic pressures Stress test in June 2014, pharmacologic Myoview, showing no significant ischemia, ejection fraction 51%   Outpatient Encounter Prescriptions as of 02/02/2015  Medication Sig  . albuterol (PROVENTIL HFA;VENTOLIN HFA) 108 (90 BASE) MCG/ACT inhaler Inhale 2 puffs into the lungs every 6 (six) hours as needed for wheezing or shortness of breath.  . cloNIDine (CATAPRES) 0.1 MG tablet Take 1 tablet (0.1 mg total) by mouth 2 (two) times daily.  . cyclobenzaprine (FLEXERIL) 5 MG tablet Take 5 mg by mouth daily.  Marland Kitchen labetalol (NORMODYNE) 300 MG tablet take 1 tablet by mouth three times a day  . montelukast (SINGULAIR) 10 MG tablet Take 1 tablet (10 mg total) by mouth at bedtime.  Marland Kitchen morphine (MS CONTIN) 15 MG 12 hr tablet Take 1 tablet (15 mg total) by mouth every 12 (twelve) hours.  . nitroGLYCERIN (NITROSTAT) 0.4 MG SL tablet Place 1 tablet (0.4 mg total) under the tongue every 5 (five) minutes as needed for chest pain.  Marland Kitchen ondansetron (ZOFRAN) 8 MG tablet Take 8 mg by mouth every 8 (eight) hours as needed for nausea or vomiting.  . Oxycodone HCl 20 MG TABS Take 1 tablet (20 mg total) by mouth 3 (three) times daily.  . potassium chloride SA (K-DUR,KLOR-CON) 20 MEQ tablet Take 40 mEq by mouth daily.  Marland Kitchen torsemide (DEMADEX) 20 MG tablet Take 1 tablet (20 mg total) by mouth daily. (Patient taking differently: Take 20 mg by mouth 2 (two) times daily. )  . [  DISCONTINUED] doxazosin (CARDURA) 8 MG tablet   . [DISCONTINUED] Linaclotide (LINZESS) 145 MCG CAPS capsule Take 145 mcg by mouth daily.  . [DISCONTINUED] Oxycodone HCl 20 MG TABS Take 1 tablet (20 mg total) by mouth 3 (three) times daily.  Marland Kitchen aspirin EC 81 MG EC tablet Take 1 tablet (81 mg total) by mouth  daily. (Patient not taking: Reported on 02/02/2015)  . [DISCONTINUED] cyclobenzaprine (FLEXERIL) 10 MG tablet Take 10 mg by mouth daily as needed for muscle spasms.   . [DISCONTINUED] famotidine (PEPCID) 20 MG tablet Take 20 mg by mouth at bedtime.  . [DISCONTINUED] hydrALAZINE (APRESOLINE) 100 MG tablet Take 100 mg by mouth 3 (three) times daily.  . [DISCONTINUED] Multiple Vitamin (MULTIVITAMIN WITH MINERALS) TABS tablet Take 1 tablet by mouth daily.  . [DISCONTINUED] ranitidine (ZANTAC) 75 MG tablet Take 150 mg by mouth daily.   No facility-administered encounter medications on file as of 02/02/2015.      Social history  reports that she has been smoking Cigarettes.  She has a 7.5 pack-year smoking history. She has never used smokeless tobacco. She reports that she does not drink alcohol or use illicit drugs.   Past medical history   has a past medical history of Obesity; Hypertension; Lumbosacral neuritis; Chronic pain; Narcotic dependence (HCC); Cataract; Diet-controlled diabetes mellitus (HCC); Pure hypercholesterolemia; Syncope and collapse; Chronic systolic CHF (congestive heart failure), NYHA class 2 (HCC); Cardiac arrest [facility]); NICM (nonischemic cardiomyopathy) (HCC); Aortic dissection, thoracic (HCC); Hypertensive hypertrophic cardiomyopathy (HCC); CKD (chronic kidney disease), stage III; Liver lesion, right lobe; and Claudication (HCC).  Review of Systems  Respiratory: Positive for shortness of breath.   Cardiovascular: Positive for leg swelling.  Gastrointestinal: Positive for abdominal distention.  Musculoskeletal: Positive for back pain.  Neurological: Negative.   Hematological: Negative.   Psychiatric/Behavioral: Negative.   All other systems reviewed and are negative.   BP 122/86 mmHg  Pulse 66  Ht  (1.626 m)  Wt 157 lb (71.215 kg)  BMI 26.94 kg/m2  Physical Exam  Constitutional: She is oriented to person, place, and time. She appears well-developed and  well-nourished.  HENT:  Head: Normocephalic.  Nose: Nose normal.  Mouth/Throat: Oropharynx is clear and moist.  Eyes: Conjunctivae are normal. Pupils are equal, round, and reactive to light.  Neck: Normal range of motion. Neck supple. No JVD present.  Cardiovascular: Normal rate, regular rhythm, S1 normal, S2 normal, normal heart sounds and intact distal pulses.  Exam reveals no gallop and no friction rub.   No murmur heard. 2+ pitting edema, leg wraps in place Edema extending up to her thighs   Pulmonary/Chest: Effort normal and breath sounds normal. No respiratory distress. She has no wheezes. She has no rales. She exhibits no tenderness.  Abdominal: Soft. Bowel sounds are normal. She exhibits no distension. There is no tenderness.  Musculoskeletal: Normal range of motion. She exhibits no edema or tenderness.  Lymphadenopathy:    She has no cervical adenopathy.  Neurological: She is alert and oriented to person, place, and time. Coordination normal.  Skin: Skin is warm and dry. No rash noted. No erythema.  Psychiatric: She has a normal mood and affect. Her behavior is normal. Judgment and thought content normal.  Appears depressed  Assessment and Plan  Nursing note and vitals reviewed.

## 2015-02-02 NOTE — Assessment & Plan Note (Signed)
Significant leg edema on today's visit, Dramatic worsening since prior clinic visit earlier in 2016. No elevated JVP, normal ejection fraction on echocardiogram with no mention of elevated right heart pressures. She has been seen by vascular surgery, compression wraps in place. This raises the concern of lymphedema. She does report lymphedema compression pumps have been ordered but take 4 weeks Right heart catheterization scheduled for next week to confirm normal right heart pressures

## 2015-02-02 NOTE — Assessment & Plan Note (Signed)
Recent echocardiogram confirming normal ejection fraction, no mention of elevated right heart pressures She has extensive lower extremity edema today after proportion to her echocardiogram, ejection fraction and clinical exam (no JVP) This does raise the concern that her leg edema may be secondary to noncardiac etiology Right heart catheterization has been recommended and will be scheduled for next week

## 2015-02-02 NOTE — Assessment & Plan Note (Signed)
Underlying renal dysfunction, exacerbated by overdiuresis Right heart catheterization to determine pressures will be performed next week She is scheduled to see nephrology in follow-up

## 2015-02-05 ENCOUNTER — Telehealth: Payer: Self-pay

## 2015-02-05 ENCOUNTER — Ambulatory Visit: Payer: No Typology Code available for payment source | Admitting: Family Medicine

## 2015-02-05 DIAGNOSIS — R601 Generalized edema: Secondary | ICD-10-CM

## 2015-02-05 NOTE — Telephone Encounter (Signed)
Calling asking if Dr. Mariah Milling wanted to do a pelvic CT on this pt. States dx states leg swelling

## 2015-02-05 NOTE — Telephone Encounter (Signed)
Per Dr. Mariah Milling, ok to add pelvic CT. Spoke w/ Judeth Cornfield and advised that order is being added.

## 2015-02-06 ENCOUNTER — Ambulatory Visit
Admission: RE | Admit: 2015-02-06 | Discharge: 2015-02-06 | Disposition: A | Discharge status: Home / Self Care | Payer: No Typology Code available for payment source | Source: Ambulatory Visit | Attending: Cardiovascular Disease | Admitting: Cardiovascular Disease

## 2015-02-06 ENCOUNTER — Telehealth: Payer: Self-pay

## 2015-02-06 ENCOUNTER — Other Ambulatory Visit: Payer: No Typology Code available for payment source

## 2015-02-06 DIAGNOSIS — R0602 Shortness of breath: Secondary | ICD-10-CM | POA: Insufficient documentation

## 2015-02-06 DIAGNOSIS — R1900 Intra-abdominal and pelvic swelling, mass and lump, unspecified site: Secondary | ICD-10-CM | POA: Diagnosis present

## 2015-02-06 DIAGNOSIS — R109 Unspecified abdominal pain: Secondary | ICD-10-CM

## 2015-02-06 DIAGNOSIS — R601 Generalized edema: Secondary | ICD-10-CM | POA: Diagnosis present

## 2015-02-06 NOTE — Telephone Encounter (Signed)
Spoke w/ pt.  Reviewed instructions for cardiac cath sched for 11/17. She verbalizes understanding and is appreciative of the call.

## 2015-02-07 ENCOUNTER — Other Ambulatory Visit
Admission: RE | Admit: 2015-02-07 | Discharge: 2015-02-07 | Disposition: A | Discharge status: Home / Self Care | Payer: No Typology Code available for payment source | Source: Ambulatory Visit | Attending: Cardiovascular Disease | Admitting: Cardiovascular Disease

## 2015-02-07 ENCOUNTER — Other Ambulatory Visit: Payer: Self-pay | Admitting: Cardiovascular Disease

## 2015-02-07 DIAGNOSIS — Z029 Encounter for administrative examinations, unspecified: Secondary | ICD-10-CM | POA: Insufficient documentation

## 2015-02-07 DIAGNOSIS — I272 Pulmonary hypertension, unspecified: Secondary | ICD-10-CM

## 2015-02-07 LAB — BASIC METABOLIC PANEL
Anion gap: 9 (ref 5–15)
BUN: 22 mg/dL — AB (ref 6–20)
CHLORIDE: 101 mmol/L (ref 101–111)
CO2: 30 mmol/L (ref 22–32)
CREATININE: 2.11 mg/dL — AB (ref 0.44–1.00)
Calcium: 8.6 mg/dL — ABNORMAL LOW (ref 8.9–10.3)
GFR calc Af Amer: 29 mL/min — ABNORMAL LOW (ref >60)
GFR calc non Af Amer: 25 mL/min — ABNORMAL LOW (ref >60)
Glucose, Bld: 123 mg/dL — ABNORMAL HIGH (ref 65–99)
Potassium: 3.5 mmol/L (ref 3.5–5.1)
SODIUM: 140 mmol/L (ref 135–145)

## 2015-02-07 LAB — CBC WITH DIFFERENTIAL/PLATELET
Basophils Absolute: 0.1 10*3/uL (ref 0–0.1)
Basophils Relative: 1 %
EOS ABS: 0 10*3/uL (ref 0–0.7)
EOS PCT: 0 %
HCT: 30 % — ABNORMAL LOW (ref 35.0–47.0)
Hemoglobin: 9.9 g/dL — ABNORMAL LOW (ref 12.0–16.0)
LYMPHS ABS: 0.7 10*3/uL — AB (ref 1.0–3.6)
Lymphocytes Relative: 7 %
MCH: 29.2 pg (ref 26.0–34.0)
MCHC: 33 g/dL (ref 32.0–36.0)
MCV: 88.5 fL (ref 80.0–100.0)
MONOS PCT: 3 %
Monocytes Absolute: 0.3 10*3/uL (ref 0.2–0.9)
Neutro Abs: 9 10*3/uL — ABNORMAL HIGH (ref 1.4–6.5)
Neutrophils Relative %: 89 %
PLATELETS: 266 10*3/uL (ref 150–440)
RBC: 3.39 MIL/uL — ABNORMAL LOW (ref 3.80–5.20)
RDW: 15.6 % — ABNORMAL HIGH (ref 11.5–14.5)
WBC: 10.1 10*3/uL (ref 3.6–11.0)

## 2015-02-07 LAB — PROTIME-INR
INR: 1.09
PROTHROMBIN TIME: 14.3 s (ref 11.4–15.0)

## 2015-02-08 ENCOUNTER — Encounter
Admission: RE | Disposition: A | Discharge status: Home / Self Care | Payer: Self-pay | Source: Ambulatory Visit | Attending: Cardiovascular Disease

## 2015-02-08 ENCOUNTER — Encounter: Payer: Self-pay | Admitting: *Deleted

## 2015-02-08 ENCOUNTER — Ambulatory Visit
Admission: RE | Admit: 2015-02-08 | Discharge: 2015-02-08 | Disposition: A | Discharge status: Home / Self Care | Payer: No Typology Code available for payment source | Source: Ambulatory Visit | Attending: Cardiovascular Disease | Admitting: Cardiovascular Disease

## 2015-02-08 DIAGNOSIS — R601 Generalized edema: Secondary | ICD-10-CM | POA: Insufficient documentation

## 2015-02-08 DIAGNOSIS — I739 Peripheral vascular disease, unspecified: Secondary | ICD-10-CM | POA: Insufficient documentation

## 2015-02-08 DIAGNOSIS — E7800 Pure hypercholesterolemia, unspecified: Secondary | ICD-10-CM | POA: Diagnosis not present

## 2015-02-08 DIAGNOSIS — I5022 Chronic systolic (congestive) heart failure: Secondary | ICD-10-CM | POA: Diagnosis present

## 2015-02-08 DIAGNOSIS — G8929 Other chronic pain: Secondary | ICD-10-CM | POA: Insufficient documentation

## 2015-02-08 DIAGNOSIS — I130 Hypertensive heart and chronic kidney disease with heart failure and stage 1 through stage 4 chronic kidney disease, or unspecified chronic kidney disease: Secondary | ICD-10-CM | POA: Diagnosis not present

## 2015-02-08 DIAGNOSIS — M549 Dorsalgia, unspecified: Secondary | ICD-10-CM | POA: Diagnosis not present

## 2015-02-08 DIAGNOSIS — D649 Anemia, unspecified: Secondary | ICD-10-CM | POA: Insufficient documentation

## 2015-02-08 DIAGNOSIS — F17210 Nicotine dependence, cigarettes, uncomplicated: Secondary | ICD-10-CM | POA: Insufficient documentation

## 2015-02-08 DIAGNOSIS — Z79899 Other long term (current) drug therapy: Secondary | ICD-10-CM | POA: Insufficient documentation

## 2015-02-08 DIAGNOSIS — I5043 Acute on chronic combined systolic (congestive) and diastolic (congestive) heart failure: Secondary | ICD-10-CM | POA: Diagnosis not present

## 2015-02-08 DIAGNOSIS — I5042 Chronic combined systolic (congestive) and diastolic (congestive) heart failure: Secondary | ICD-10-CM | POA: Diagnosis not present

## 2015-02-08 DIAGNOSIS — I272 Other secondary pulmonary hypertension: Secondary | ICD-10-CM | POA: Insufficient documentation

## 2015-02-08 DIAGNOSIS — N183 Chronic kidney disease, stage 3 (moderate): Secondary | ICD-10-CM | POA: Diagnosis not present

## 2015-02-08 DIAGNOSIS — Z7951 Long term (current) use of inhaled steroids: Secondary | ICD-10-CM | POA: Insufficient documentation

## 2015-02-08 DIAGNOSIS — E1122 Type 2 diabetes mellitus with diabetic chronic kidney disease: Secondary | ICD-10-CM | POA: Diagnosis not present

## 2015-02-08 DIAGNOSIS — R600 Localized edema: Secondary | ICD-10-CM | POA: Diagnosis not present

## 2015-02-08 DIAGNOSIS — Z7982 Long term (current) use of aspirin: Secondary | ICD-10-CM | POA: Diagnosis not present

## 2015-02-08 DIAGNOSIS — R0602 Shortness of breath: Secondary | ICD-10-CM | POA: Insufficient documentation

## 2015-02-08 DIAGNOSIS — I5032 Chronic diastolic (congestive) heart failure: Secondary | ICD-10-CM | POA: Diagnosis present

## 2015-02-08 HISTORY — PX: CARDIAC CATHETERIZATION: SHX172

## 2015-02-08 SURGERY — RIGHT HEART CATH
Anesthesia: Moderate Sedation | Laterality: Right

## 2015-02-08 SURGERY — RIGHT HEART CATH
Anesthesia: Moderate Sedation

## 2015-02-08 MED ORDER — FENTANYL CITRATE (PF) 100 MCG/2ML IJ SOLN
INTRAMUSCULAR | Status: DC | PRN
  Administered 2015-02-08 (×3): 50 ug via INTRAVENOUS

## 2015-02-08 MED ORDER — SODIUM CHLORIDE 0.9 % IJ SOLN: 3.0000 mL | Freq: Two times a day (BID) | INTRAMUSCULAR | Status: DC

## 2015-02-08 MED ORDER — FENTANYL CITRATE (PF) 100 MCG/2ML IJ SOLN
INTRAMUSCULAR | Status: AC
  Filled 2015-02-08: qty 2

## 2015-02-08 MED ORDER — FAMOTIDINE 20 MG PO TABS
ORAL_TABLET | ORAL | Status: AC
  Administered 2015-02-08: 20 mg
  Filled 2015-02-08: qty 1

## 2015-02-08 MED ORDER — METHYLPREDNISOLONE SODIUM SUCC 125 MG IJ SOLR
INTRAMUSCULAR | Status: AC
  Administered 2015-02-08: 125 mg
  Filled 2015-02-08: qty 2

## 2015-02-08 MED ORDER — SODIUM CHLORIDE 0.9 % IV SOLN
INTRAVENOUS | Status: DC
  Administered 2015-02-08 (×2): via INTRAVENOUS

## 2015-02-08 SURGICAL SUPPLY — 14 items
CATH INFINITI 5FR ANG PIGTAIL (CATHETERS) ×1 IMPLANT
CATH INFINITI 5FR JL4 (CATHETERS) ×1 IMPLANT
CATH INFINITI JR4 5F (CATHETERS) ×1 IMPLANT
CATH SWANZ 7F THERMO (CATHETERS) ×3 IMPLANT
GUIDEWIRE EMER 3M J .025X150CM (WIRE) ×2 IMPLANT
KIT MANI 3VAL PERCEP (MISCELLANEOUS) ×3 IMPLANT
KIT RIGHT HEART (MISCELLANEOUS) ×1 IMPLANT
NDL PERC 18GX7CM (NEEDLE) ×1 IMPLANT
NEEDLE PERC 18GX7CM (NEEDLE) ×3 IMPLANT
NEEDLE SMART 18G ACCESS (NEEDLE) ×6 IMPLANT
PACK CARDIAC CATH (CUSTOM PROCEDURE TRAY) ×3 IMPLANT
SHEATH AVANTI 5FR X 11CM (SHEATH) ×1 IMPLANT
SHEATH PINNACLE 7F 10CM (SHEATH) ×3 IMPLANT
WIRE EMERALD 3MM-J .035X150CM (WIRE) ×1 IMPLANT

## 2015-02-08 NOTE — Discharge Instructions (Signed)
Angiogram, Care After °Refer to this sheet in the next few weeks. These instructions provide you with information about caring for yourself after your procedure. Your health care provider may also give you more specific instructions. Your treatment has been planned according to current medical practices, but problems sometimes occur. Call your health care provider if you have any problems or questions after your procedure. °WHAT TO EXPECT AFTER THE PROCEDURE °After your procedure, it is typical to have the following: °· Bruising at the catheter insertion site that usually fades within 1-2 weeks. °· Blood collecting in the tissue (hematoma) that may be painful to the touch. It should usually decrease in size and tenderness within 1-2 weeks. °HOME CARE INSTRUCTIONS °· Take medicines only as directed by your health care provider. °· You may shower 24-48 hours after the procedure or as directed by your health care provider. Remove the bandage (dressing) and gently wash the site with plain soap and water. Pat the area dry with a clean towel. Do not rub the site, because this may cause bleeding. °· Do not take baths, swim, or use a hot tub until your health care provider approves. °· Check your insertion site every day for redness, swelling, or drainage. °· Do not apply powder or lotion to the site. °· Do not lift over 10 lb (4.5 kg) for 5 days after your procedure or as directed by your health care provider. °· Ask your health care provider when it is okay to: °¨ Return to work or school. °¨ Resume usual physical activities or sports. °¨ Resume sexual activity. °· Do not drive home if you are discharged the same day as the procedure. Have someone else drive you. °· You may drive 24 hours after the procedure unless otherwise instructed by your health care provider. °· Do not operate machinery or power tools for 24 hours after the procedure or as directed by your health care provider. °· If your procedure was done as an  outpatient procedure, which means that you went home the same day as your procedure, a responsible adult should be with you for the first 24 hours after you arrive home. °· Keep all follow-up visits as directed by your health care provider. This is important. °SEEK MEDICAL CARE IF: °· You have a fever. °· You have chills. °· You have increased bleeding from the catheter insertion site. Hold pressure on the site. °SEEK IMMEDIATE MEDICAL CARE IF: °· You have unusual pain at the catheter insertion site. °· You have redness, warmth, or swelling at the catheter insertion site. °· You have drainage (other than a small amount of blood on the dressing) from the catheter insertion site. °· The catheter insertion site is bleeding, and the bleeding does not stop after 30 minutes of holding steady pressure on the site. °· The area near or just beyond the catheter insertion site becomes pale, cool, tingly, or numb. °  °This information is not intended to replace advice given to you by your health care provider. Make sure you discuss any questions you have with your health care provider. °  °Document Released: 09/26/2004 Document Revised: 03/31/2014 Document Reviewed: 08/11/2012 °Elsevier Interactive Patient Education ©2016 [employer] ° °

## 2015-02-08 NOTE — Progress Notes (Signed)
Pt doing well post heart cath pe r Dr Mariah Milling, no bleeding nor hematoma at right groin site, discharge instructions given witht questions answered, already has return appt, prior to procedure. Vss.

## 2015-02-09 ENCOUNTER — Other Ambulatory Visit: Payer: Self-pay | Admitting: Family Medicine

## 2015-02-09 ENCOUNTER — Telehealth: Payer: Self-pay | Admitting: Family Medicine

## 2015-02-09 MED ORDER — PREDNISONE 10 MG (21) PO TBPK: ORAL_TABLET | ORAL | Status: DC

## 2015-02-09 NOTE — Telephone Encounter (Signed)
Pt states she needs prednisone for a cold. Pt is wheezing and has phelm. Meds to be called into [facility] [address]

## 2015-02-14 ENCOUNTER — Telehealth: Payer: Self-pay | Admitting: Family

## 2015-02-14 ENCOUNTER — Ambulatory Visit: Payer: No Typology Code available for payment source | Admitting: Family

## 2015-02-14 ENCOUNTER — Ambulatory Visit (INDEPENDENT_AMBULATORY_CARE_PROVIDER_SITE_OTHER): Payer: No Typology Code available for payment source | Admitting: Family Medicine

## 2015-02-14 ENCOUNTER — Encounter: Payer: Self-pay | Admitting: Family Medicine

## 2015-02-14 VITALS — BP 128/64 | HR 88 | Temp 98.8°F | Resp 18 | Ht 64.0 in | Wt 153.7 lb

## 2015-02-14 DIAGNOSIS — N183 Chronic kidney disease, stage 3 unspecified: Secondary | ICD-10-CM

## 2015-02-14 DIAGNOSIS — I7090 Unspecified atherosclerosis: Secondary | ICD-10-CM

## 2015-02-14 DIAGNOSIS — F1120 Opioid dependence, uncomplicated: Secondary | ICD-10-CM

## 2015-02-14 DIAGNOSIS — F17200 Nicotine dependence, unspecified, uncomplicated: Secondary | ICD-10-CM

## 2015-02-14 DIAGNOSIS — R600 Localized edema: Secondary | ICD-10-CM | POA: Diagnosis not present

## 2015-02-14 DIAGNOSIS — M5417 Radiculopathy, lumbosacral region: Secondary | ICD-10-CM | POA: Diagnosis not present

## 2015-02-14 DIAGNOSIS — I5043 Acute on chronic combined systolic (congestive) and diastolic (congestive) heart failure: Secondary | ICD-10-CM | POA: Diagnosis not present

## 2015-02-14 DIAGNOSIS — I71012 Dissection of descending thoracic aorta: Secondary | ICD-10-CM

## 2015-02-14 DIAGNOSIS — Z23 Encounter for immunization: Secondary | ICD-10-CM | POA: Diagnosis not present

## 2015-02-14 DIAGNOSIS — I110 Hypertensive heart disease with heart failure: Secondary | ICD-10-CM

## 2015-02-14 DIAGNOSIS — E1122 Type 2 diabetes mellitus with diabetic chronic kidney disease: Secondary | ICD-10-CM

## 2015-02-14 DIAGNOSIS — N184 Chronic kidney disease, stage 4 (severe): Secondary | ICD-10-CM

## 2015-02-14 DIAGNOSIS — I272 Other secondary pulmonary hypertension: Secondary | ICD-10-CM

## 2015-02-14 DIAGNOSIS — I10 Essential (primary) hypertension: Secondary | ICD-10-CM | POA: Diagnosis not present

## 2015-02-14 DIAGNOSIS — I7101 Dissection of thoracic aorta: Secondary | ICD-10-CM

## 2015-02-14 DIAGNOSIS — I422 Other hypertrophic cardiomyopathy: Secondary | ICD-10-CM

## 2015-02-14 MED ORDER — OXYCODONE HCL 20 MG PO TABS: 20.0000 mg | ORAL_TABLET | Freq: Three times a day (TID) | ORAL | Status: DC

## 2015-02-14 MED ORDER — MORPHINE SULFATE ER 15 MG PO TBCR: 15.0000 mg | EXTENDED_RELEASE_TABLET | Freq: Two times a day (BID) | ORAL | Status: DC

## 2015-02-14 NOTE — Telephone Encounter (Signed)
Patient did not show for her appointment [facility] today (02/14/15). Will attempt to reschedule.
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
## 2015-02-19 ENCOUNTER — Other Ambulatory Visit: Payer: Self-pay | Admitting: Pain Medicine

## 2015-02-19 ENCOUNTER — Encounter: Payer: Self-pay | Admitting: Pain Medicine

## 2015-02-19 ENCOUNTER — Ambulatory Visit: Payer: No Typology Code available for payment source | Attending: Pain Medicine | Admitting: Pain Medicine

## 2015-02-19 VITALS — BP 154/94 | HR 89 | Temp 98.2°F | Resp 18 | Ht 64.0 in | Wt 149.0 lb

## 2015-02-19 DIAGNOSIS — F1190 Opioid use, unspecified, uncomplicated: Secondary | ICD-10-CM | POA: Insufficient documentation

## 2015-02-19 DIAGNOSIS — T148 Other injury of unspecified body region: Secondary | ICD-10-CM

## 2015-02-19 DIAGNOSIS — Z79899 Other long term (current) drug therapy: Secondary | ICD-10-CM | POA: Diagnosis not present

## 2015-02-19 DIAGNOSIS — G8921 Chronic pain due to trauma: Secondary | ICD-10-CM | POA: Diagnosis not present

## 2015-02-19 DIAGNOSIS — M549 Dorsalgia, unspecified: Secondary | ICD-10-CM | POA: Diagnosis present

## 2015-02-19 DIAGNOSIS — N184 Chronic kidney disease, stage 4 (severe): Secondary | ICD-10-CM | POA: Insufficient documentation

## 2015-02-19 DIAGNOSIS — M5410 Radiculopathy, site unspecified: Secondary | ICD-10-CM

## 2015-02-19 DIAGNOSIS — M5417 Radiculopathy, lumbosacral region: Secondary | ICD-10-CM | POA: Diagnosis not present

## 2015-02-19 DIAGNOSIS — M4854XA Collapsed vertebra, not elsewhere classified, thoracic region, initial encounter for fracture: Secondary | ICD-10-CM | POA: Diagnosis not present

## 2015-02-19 DIAGNOSIS — E119 Type 2 diabetes mellitus without complications: Secondary | ICD-10-CM | POA: Insufficient documentation

## 2015-02-19 DIAGNOSIS — Z9889 Other specified postprocedural states: Secondary | ICD-10-CM | POA: Insufficient documentation

## 2015-02-19 DIAGNOSIS — Z7189 Other specified counseling: Secondary | ICD-10-CM | POA: Insufficient documentation

## 2015-02-19 DIAGNOSIS — F17210 Nicotine dependence, cigarettes, uncomplicated: Secondary | ICD-10-CM | POA: Insufficient documentation

## 2015-02-19 DIAGNOSIS — M79606 Pain in leg, unspecified: Secondary | ICD-10-CM | POA: Diagnosis present

## 2015-02-19 DIAGNOSIS — T50995A Adverse effect of other drugs, medicaments and biological substances, initial encounter: Secondary | ICD-10-CM | POA: Insufficient documentation

## 2015-02-19 DIAGNOSIS — Z79891 Long term (current) use of opiate analgesic: Secondary | ICD-10-CM | POA: Diagnosis not present

## 2015-02-19 DIAGNOSIS — S32000A Wedge compression fracture of unspecified lumbar vertebra, initial encounter for closed fracture: Secondary | ICD-10-CM | POA: Insufficient documentation

## 2015-02-19 DIAGNOSIS — I5022 Chronic systolic (congestive) heart failure: Secondary | ICD-10-CM | POA: Diagnosis not present

## 2015-02-19 DIAGNOSIS — I429 Cardiomyopathy, unspecified: Secondary | ICD-10-CM | POA: Diagnosis not present

## 2015-02-19 DIAGNOSIS — I10 Essential (primary) hypertension: Secondary | ICD-10-CM | POA: Insufficient documentation

## 2015-02-19 DIAGNOSIS — G8929 Other chronic pain: Secondary | ICD-10-CM | POA: Diagnosis not present

## 2015-02-19 DIAGNOSIS — K5903 Drug induced constipation: Secondary | ICD-10-CM | POA: Diagnosis not present

## 2015-02-19 DIAGNOSIS — M546 Pain in thoracic spine: Secondary | ICD-10-CM | POA: Diagnosis not present

## 2015-02-19 DIAGNOSIS — M4856XA Collapsed vertebra, not elsewhere classified, lumbar region, initial encounter for fracture: Secondary | ICD-10-CM | POA: Insufficient documentation

## 2015-02-19 DIAGNOSIS — Z5181 Encounter for therapeutic drug level monitoring: Secondary | ICD-10-CM

## 2015-02-19 DIAGNOSIS — E669 Obesity, unspecified: Secondary | ICD-10-CM | POA: Diagnosis not present

## 2015-02-19 DIAGNOSIS — I5032 Chronic diastolic (congestive) heart failure: Secondary | ICD-10-CM | POA: Insufficient documentation

## 2015-02-19 DIAGNOSIS — M5414 Radiculopathy, thoracic region: Secondary | ICD-10-CM | POA: Insufficient documentation

## 2015-02-19 DIAGNOSIS — F1290 Cannabis use, unspecified, uncomplicated: Secondary | ICD-10-CM

## 2015-02-19 DIAGNOSIS — G894 Chronic pain syndrome: Secondary | ICD-10-CM

## 2015-02-19 DIAGNOSIS — E7800 Pure hypercholesterolemia, unspecified: Secondary | ICD-10-CM | POA: Diagnosis not present

## 2015-02-19 DIAGNOSIS — M810 Age-related osteoporosis without current pathological fracture: Secondary | ICD-10-CM | POA: Diagnosis not present

## 2015-02-19 DIAGNOSIS — T50995S Adverse effect of other drugs, medicaments and biological substances, sequela: Secondary | ICD-10-CM

## 2015-02-19 DIAGNOSIS — I517 Cardiomegaly: Secondary | ICD-10-CM

## 2015-02-19 DIAGNOSIS — S32000S Wedge compression fracture of unspecified lumbar vertebra, sequela: Secondary | ICD-10-CM

## 2015-02-19 DIAGNOSIS — Z888 Allergy status to other drugs, medicaments and biological substances status: Secondary | ICD-10-CM | POA: Insufficient documentation

## 2015-02-19 DIAGNOSIS — T402X5A Adverse effect of other opioids, initial encounter: Secondary | ICD-10-CM

## 2015-02-19 DIAGNOSIS — S22000S Wedge compression fracture of unspecified thoracic vertebra, sequela: Secondary | ICD-10-CM

## 2015-02-19 DIAGNOSIS — S22000A Wedge compression fracture of unspecified thoracic vertebra, initial encounter for closed fracture: Secondary | ICD-10-CM | POA: Insufficient documentation

## 2015-02-19 DIAGNOSIS — F1210 Cannabis abuse, uncomplicated: Secondary | ICD-10-CM

## 2015-02-19 DIAGNOSIS — IMO0002 Reserved for concepts with insufficient information to code with codable children: Secondary | ICD-10-CM

## 2015-02-19 NOTE — Progress Notes (Signed)
Safety precautions to be maintained throughout the outpatient stay will include: orient to surroundings, keep bed in low position, maintain call bell within reach at all times, provide assistance with transfer out of bed and ambulation.  Morphine (MS Contin) patient did not bring
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
## 2015-02-22 ENCOUNTER — Other Ambulatory Visit: Payer: Self-pay | Admitting: Thoracic Surgery (Cardiothoracic Vascular Surgery)

## 2015-02-22 DIAGNOSIS — I719 Aortic aneurysm of unspecified site, without rupture: Secondary | ICD-10-CM

## 2015-02-24 LAB — TOXASSURE SELECT 13 (MW), URINE

## 2015-03-03 DIAGNOSIS — R52 Pain, unspecified: Secondary | ICD-10-CM | POA: Insufficient documentation

## 2015-03-06 ENCOUNTER — Ambulatory Visit (INDEPENDENT_AMBULATORY_CARE_PROVIDER_SITE_OTHER): Payer: No Typology Code available for payment source | Admitting: Thoracic Surgery (Cardiothoracic Vascular Surgery)

## 2015-03-06 ENCOUNTER — Inpatient Hospital Stay: Admission: RE | Admit: 2015-03-06 | Payer: No Typology Code available for payment source | Source: Ambulatory Visit

## 2015-03-06 ENCOUNTER — Encounter: Payer: Self-pay | Admitting: Thoracic Surgery (Cardiothoracic Vascular Surgery)

## 2015-03-06 VITALS — BP 145/97 | HR 97 | Resp 16 | Ht 64.0 in | Wt 149.0 lb

## 2015-03-06 DIAGNOSIS — I7101 Dissection of thoracic aorta: Secondary | ICD-10-CM

## 2015-03-06 DIAGNOSIS — I71019 Dissection of thoracic aorta, unspecified: Secondary | ICD-10-CM
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
## 2015-03-08 ENCOUNTER — Ambulatory Visit: Payer: No Typology Code available for payment source | Admitting: Physician Assistant

## 2015-03-08 ENCOUNTER — Ambulatory Visit: Payer: No Typology Code available for payment source | Admitting: Family Medicine

## 2015-03-09 ENCOUNTER — Ambulatory Visit (INDEPENDENT_AMBULATORY_CARE_PROVIDER_SITE_OTHER): Payer: No Typology Code available for payment source | Admitting: Family Medicine

## 2015-03-09 ENCOUNTER — Encounter: Payer: Self-pay | Admitting: Family Medicine

## 2015-03-09 VITALS — BP 160/82 | HR 105 | Temp 98.4°F | Resp 18 | Ht 64.0 in | Wt 140.2 lb

## 2015-03-09 DIAGNOSIS — N184 Chronic kidney disease, stage 4 (severe): Secondary | ICD-10-CM

## 2015-03-09 DIAGNOSIS — I119 Hypertensive heart disease without heart failure: Secondary | ICD-10-CM | POA: Diagnosis not present

## 2015-03-09 DIAGNOSIS — I7101 Dissection of thoracic aorta: Secondary | ICD-10-CM | POA: Diagnosis not present

## 2015-03-09 DIAGNOSIS — I5022 Chronic systolic (congestive) heart failure: Secondary | ICD-10-CM

## 2015-03-09 DIAGNOSIS — S22000G Wedge compression fracture of unspecified thoracic vertebra, subsequent encounter for fracture with delayed healing: Secondary | ICD-10-CM | POA: Diagnosis not present

## 2015-03-09 DIAGNOSIS — F1120 Opioid dependence, uncomplicated: Secondary | ICD-10-CM

## 2015-03-09 DIAGNOSIS — E7800 Pure hypercholesterolemia, unspecified: Secondary | ICD-10-CM

## 2015-03-09 DIAGNOSIS — E1122 Type 2 diabetes mellitus with diabetic chronic kidney disease: Secondary | ICD-10-CM

## 2015-03-09 DIAGNOSIS — I10 Essential (primary) hypertension: Secondary | ICD-10-CM | POA: Diagnosis not present

## 2015-03-09 DIAGNOSIS — M5417 Radiculopathy, lumbosacral region: Secondary | ICD-10-CM | POA: Diagnosis not present

## 2015-03-09 DIAGNOSIS — I428 Other cardiomyopathies: Secondary | ICD-10-CM

## 2015-03-09 DIAGNOSIS — M810 Age-related osteoporosis without current pathological fracture: Secondary | ICD-10-CM

## 2015-03-09 DIAGNOSIS — I429 Cardiomyopathy, unspecified: Secondary | ICD-10-CM

## 2015-03-09 DIAGNOSIS — E876 Hypokalemia: Secondary | ICD-10-CM

## 2015-03-09 DIAGNOSIS — S32000G Wedge compression fracture of unspecified lumbar vertebra, subsequent encounter for fracture with delayed healing: Secondary | ICD-10-CM

## 2015-03-09 DIAGNOSIS — I422 Other hypertrophic cardiomyopathy: Secondary | ICD-10-CM

## 2015-03-09 DIAGNOSIS — K5903 Drug induced constipation: Secondary | ICD-10-CM

## 2015-03-09 DIAGNOSIS — I71019 Dissection of thoracic aorta, unspecified: Secondary | ICD-10-CM

## 2015-03-09 DIAGNOSIS — R0902 Hypoxemia: Secondary | ICD-10-CM | POA: Insufficient documentation

## 2015-03-09 DIAGNOSIS — T402X5A Adverse effect of other opioids, initial encounter: Secondary | ICD-10-CM

## 2015-03-09 DIAGNOSIS — M5414 Radiculopathy, thoracic region: Secondary | ICD-10-CM

## 2015-03-09 DIAGNOSIS — I71012 Dissection of descending thoracic aorta: Secondary | ICD-10-CM

## 2015-03-09 DIAGNOSIS — G8929 Other chronic pain: Secondary | ICD-10-CM

## 2015-03-09 DIAGNOSIS — R0602 Shortness of breath: Secondary | ICD-10-CM

## 2015-03-09 MED ORDER — CYCLOBENZAPRINE HCL 10 MG PO TABS: 10.0000 mg | ORAL_TABLET | Freq: Every day | ORAL | Status: DC
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
## 2015-03-12 ENCOUNTER — Encounter: Payer: Self-pay | Admitting: Family Medicine

## 2015-03-12 ENCOUNTER — Ambulatory Visit: Payer: No Typology Code available for payment source | Admitting: Cardiovascular Disease

## 2015-03-12 ENCOUNTER — Telehealth: Payer: Self-pay
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
## 2015-03-12 NOTE — Telephone Encounter (Signed)
Called pt to inform her that amended paperwork was ready to be picked up and that the claim needed to be resubmitted to insurance but pt vm was full and I could not leave a message
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
## 2015-03-13 ENCOUNTER — Encounter: Payer: Self-pay | Admitting: Family Medicine

## 2015-03-22 ENCOUNTER — Ambulatory Visit: Payer: No Typology Code available for payment source | Admitting: Family Medicine

## 2015-04-04 ENCOUNTER — Telehealth: Payer: Self-pay | Admitting: Family Medicine

## 2015-04-04 NOTE — Telephone Encounter (Signed)
Pt also need Zophan

## 2015-04-04 NOTE — Telephone Encounter (Signed)
Pt needs refill on pain meds 

## 2015-04-05 NOTE — Telephone Encounter (Signed)
Not due until 89 th She can get it then

## 2015-04-06 ENCOUNTER — Other Ambulatory Visit: Payer: Self-pay

## 2015-04-06 ENCOUNTER — Encounter: Payer: Self-pay | Admitting: Nurse Practitioner

## 2015-04-06 ENCOUNTER — Ambulatory Visit (INDEPENDENT_AMBULATORY_CARE_PROVIDER_SITE_OTHER): Payer: Self-pay | Admitting: Nurse Practitioner

## 2015-04-06 VITALS — BP 140/100 | HR 94 | Ht 64.0 in | Wt 144.8 lb

## 2015-04-06 DIAGNOSIS — I509 Heart failure, unspecified: Secondary | ICD-10-CM

## 2015-04-06 DIAGNOSIS — R079 Chest pain, unspecified: Secondary | ICD-10-CM | POA: Insufficient documentation

## 2015-04-06 DIAGNOSIS — N183 Chronic kidney disease, stage 3 unspecified: Secondary | ICD-10-CM

## 2015-04-06 DIAGNOSIS — I130 Hypertensive heart and chronic kidney disease with heart failure and stage 1 through stage 4 chronic kidney disease, or unspecified chronic kidney disease: Secondary | ICD-10-CM

## 2015-04-06 DIAGNOSIS — I10 Essential (primary) hypertension: Secondary | ICD-10-CM

## 2015-04-06 DIAGNOSIS — I5033 Acute on chronic diastolic (congestive) heart failure: Secondary | ICD-10-CM

## 2015-04-06 DIAGNOSIS — R072 Precordial pain: Secondary | ICD-10-CM

## 2015-04-06 MED ORDER — HYDRALAZINE HCL 25 MG PO TABS: 25.0000 mg | ORAL_TABLET | Freq: Two times a day (BID) | ORAL | Status: DC

## 2015-04-06 MED ORDER — CLONIDINE HCL 0.1 MG PO TABS: 0.1000 mg | ORAL_TABLET | Freq: Two times a day (BID) | ORAL | Status: DC

## 2015-04-06 MED ORDER — OXYCODONE HCL 20 MG PO TABS: 20.0000 mg | ORAL_TABLET | Freq: Three times a day (TID) | ORAL | Status: DC

## 2015-04-06 MED ORDER — MORPHINE SULFATE ER 15 MG PO TBCR: 15.0000 mg | EXTENDED_RELEASE_TABLET | Freq: Two times a day (BID) | ORAL | Status: DC
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
## 2015-04-06 NOTE — Patient Instructions (Addendum)
Medication Instructions:  Please increase your hydralazine to 25 mg twice daily Please take torsemide 20 mg twice daily x 5 days  Labwork: BMET next week  Testing/Procedures: None  Follow-Up: Next week w/ Ward Givens, NP or Eula Listen, PA  If you need a refill on your cardiac medications before your next appointment, please call your pharmacy.

## 2015-04-06 NOTE — Telephone Encounter (Signed)
Lisa Leblanc spoke with pt and she is aware that she can have it refilled on 04/09/15

## 2015-04-16 ENCOUNTER — Ambulatory Visit: Payer: No Typology Code available for payment source | Admitting: Family Medicine

## 2015-04-17 ENCOUNTER — Encounter: Payer: Self-pay | Admitting: Physician Assistant

## 2015-04-17 ENCOUNTER — Other Ambulatory Visit (INDEPENDENT_AMBULATORY_CARE_PROVIDER_SITE_OTHER): Payer: Self-pay | Admitting: *Deleted

## 2015-04-17 ENCOUNTER — Ambulatory Visit (INDEPENDENT_AMBULATORY_CARE_PROVIDER_SITE_OTHER): Payer: Self-pay | Admitting: Physician Assistant

## 2015-04-17 VITALS — BP 120/80 | HR 81 | Ht 60.0 in | Wt 143.0 lb

## 2015-04-17 DIAGNOSIS — I509 Heart failure, unspecified: Secondary | ICD-10-CM

## 2015-04-17 DIAGNOSIS — I129 Hypertensive chronic kidney disease with stage 1 through stage 4 chronic kidney disease, or unspecified chronic kidney disease: Secondary | ICD-10-CM

## 2015-04-17 DIAGNOSIS — G8929 Other chronic pain: Secondary | ICD-10-CM

## 2015-04-17 DIAGNOSIS — I110 Hypertensive heart disease with heart failure: Secondary | ICD-10-CM

## 2015-04-17 DIAGNOSIS — I5032 Chronic diastolic (congestive) heart failure: Secondary | ICD-10-CM

## 2015-04-17 DIAGNOSIS — N183 Chronic kidney disease, stage 3 unspecified: Secondary | ICD-10-CM

## 2015-04-17 DIAGNOSIS — I5033 Acute on chronic diastolic (congestive) heart failure: Secondary | ICD-10-CM

## 2015-04-17 DIAGNOSIS — R079 Chest pain, unspecified: Secondary | ICD-10-CM

## 2015-04-17 DIAGNOSIS — I130 Hypertensive heart and chronic kidney disease with heart failure and stage 1 through stage 4 chronic kidney disease, or unspecified chronic kidney disease: Secondary | ICD-10-CM

## 2015-04-17 MED ORDER — METOLAZONE 2.5 MG PO TABS: 2.5000 mg | ORAL_TABLET | Freq: Every day | ORAL | Status: DC
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
## 2015-04-17 NOTE — Patient Instructions (Addendum)
Medication Instructions:  Your physician has recommended you make the following change in your medication:  STOP taking spironolactone START taking metolazone 2.5mg  once a day for three days then stop INCREASE potassium to twice daily for three days then resume normal dosage CONTINUE torsemide 20mg  daily   Labwork: BMET today BMET January 30  Testing/Procedures: none  Follow-Up: Your physician recommends that you schedule a follow-up appointment in: one week with Dr. Mariah Milling, Eula Listen, or Ward Givens   Any Other Special Instructions Will Be Listed Below (If Applicable).     If you need a refill on your cardiac medications before your next appointment, please call your pharmacy.

## 2015-04-18 LAB — BASIC METABOLIC PANEL
BUN/Creatinine Ratio: 8 — ABNORMAL LOW (ref 9–23)
BUN: 15 mg/dL (ref 6–24)
CO2: 22 mmol/L (ref 18–29)
CREATININE: 1.99 mg/dL — AB (ref 0.57–1.00)
Calcium: 8.7 mg/dL (ref 8.7–10.2)
Chloride: 95 mmol/L — ABNORMAL LOW (ref 96–106)
GFR, EST AFRICAN AMERICAN: 32 mL/min/{1.73_m2} — AB (ref >59)
GFR, EST NON AFRICAN AMERICAN: 27 mL/min/{1.73_m2} — AB (ref >59)
Glucose: 89 mg/dL (ref 65–99)
POTASSIUM: 4.3 mmol/L (ref 3.5–5.2)
SODIUM: 136 mmol/L (ref 134–144)

## 2015-04-19 ENCOUNTER — Other Ambulatory Visit: Payer: Self-pay | Admitting: Family Medicine

## 2015-04-19 MED ORDER — OXYCODONE HCL 20 MG PO TABS: 20.0000 mg | ORAL_TABLET | Freq: Three times a day (TID) | ORAL | Status: DC

## 2015-04-23 ENCOUNTER — Other Ambulatory Visit (INDEPENDENT_AMBULATORY_CARE_PROVIDER_SITE_OTHER): Payer: Self-pay

## 2015-04-23 DIAGNOSIS — I5032 Chronic diastolic (congestive) heart failure: Secondary | ICD-10-CM

## 2015-04-23 DIAGNOSIS — I5033 Acute on chronic diastolic (congestive) heart failure: Secondary | ICD-10-CM

## 2015-04-24 LAB — BASIC METABOLIC PANEL
BUN/Creatinine Ratio: 10 (ref 9–23)
BUN: 28 mg/dL — AB (ref 6–24)
CALCIUM: 8.3 mg/dL — AB (ref 8.7–10.2)
CHLORIDE: 85 mmol/L — AB (ref 96–106)
CO2: 26 mmol/L (ref 18–29)
Creatinine, Ser: 2.91 mg/dL — ABNORMAL HIGH (ref 0.57–1.00)
GFR calc non Af Amer: 17 mL/min/{1.73_m2} — ABNORMAL LOW (ref >59)
GFR, EST AFRICAN AMERICAN: 20 mL/min/{1.73_m2} — AB (ref >59)
GLUCOSE: 82 mg/dL (ref 65–99)
POTASSIUM: 3.6 mmol/L (ref 3.5–5.2)
Sodium: 133 mmol/L — ABNORMAL LOW (ref 134–144)
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
## 2015-04-25 ENCOUNTER — Ambulatory Visit (INDEPENDENT_AMBULATORY_CARE_PROVIDER_SITE_OTHER): Payer: Self-pay | Admitting: Physician Assistant

## 2015-04-25 ENCOUNTER — Encounter: Payer: Self-pay | Admitting: Physician Assistant

## 2015-04-25 VITALS — BP 122/88 | HR 74 | Ht 60.0 in | Wt 121.5 lb

## 2015-04-25 DIAGNOSIS — N183 Chronic kidney disease, stage 3 unspecified: Secondary | ICD-10-CM

## 2015-04-25 DIAGNOSIS — R079 Chest pain, unspecified: Secondary | ICD-10-CM

## 2015-04-25 DIAGNOSIS — I5033 Acute on chronic diastolic (congestive) heart failure: Secondary | ICD-10-CM

## 2015-04-25 DIAGNOSIS — M549 Dorsalgia, unspecified: Secondary | ICD-10-CM

## 2015-04-25 DIAGNOSIS — I110 Hypertensive heart disease with heart failure: Secondary | ICD-10-CM

## 2015-04-25 DIAGNOSIS — E43 Unspecified severe protein-calorie malnutrition: Secondary | ICD-10-CM

## 2015-04-25 DIAGNOSIS — G8929 Other chronic pain: Secondary | ICD-10-CM

## 2015-04-25 DIAGNOSIS — E8809 Other disorders of plasma-protein metabolism, not elsewhere classified: Secondary | ICD-10-CM

## 2015-04-25 NOTE — Patient Instructions (Signed)
Medication Instructions:  Your physician recommends that you continue on your current medications as directed. Please refer to the Current Medication list given to you today.   Labwork: CBC, CMET  Testing/Procedures: none  Follow-Up: Your physician recommends that you schedule a follow-up appointment in: one month with Dr. Mariah Milling   Any Other Special Instructions Will Be Listed Below (If Applicable). Start drinking Ensure three times daily.     If you need a refill on your cardiac medications before your next appointment, please call your pharmacy.

## 2015-04-26 LAB — COMPREHENSIVE METABOLIC PANEL
ALBUMIN: 4 g/dL (ref 3.5–5.5)
ALT: 7 IU/L (ref 0–32)
AST: 15 IU/L (ref 0–40)
Albumin/Globulin Ratio: 1.1 (ref 1.1–2.5)
Alkaline Phosphatase: 120 IU/L — ABNORMAL HIGH (ref 39–117)
BUN/Creatinine Ratio: 10 (ref 9–23)
BUN: 31 mg/dL — AB (ref 6–24)
Bilirubin Total: 0.6 mg/dL (ref 0.0–1.2)
CALCIUM: 8.9 mg/dL (ref 8.7–10.2)
CO2: 30 mmol/L — ABNORMAL HIGH (ref 18–29)
Chloride: 85 mmol/L — ABNORMAL LOW (ref 96–106)
Creatinine, Ser: 3.12 mg/dL — ABNORMAL HIGH (ref 0.57–1.00)
GFR, EST AFRICAN AMERICAN: 18 mL/min/{1.73_m2} — AB (ref >59)
GFR, EST NON AFRICAN AMERICAN: 16 mL/min/{1.73_m2} — AB (ref >59)
GLUCOSE: 103 mg/dL — AB (ref 65–99)
Globulin, Total: 3.5 g/dL (ref 1.5–4.5)
Potassium: 4.6 mmol/L (ref 3.5–5.2)
Sodium: 136 mmol/L (ref 134–144)
TOTAL PROTEIN: 7.5 g/dL (ref 6.0–8.5)

## 2015-04-26 LAB — CBC
HEMOGLOBIN: 10.5 g/dL — AB (ref 11.1–15.9)
Hematocrit: 31.9 % — ABNORMAL LOW (ref 34.0–46.6)
MCH: 27.2 pg (ref 26.6–33.0)
MCHC: 32.9 g/dL (ref 31.5–35.7)
MCV: 83 fL (ref 79–97)
PLATELETS: 367 10*3/uL (ref 150–379)
RBC: 3.86 x10E6/uL (ref 3.77–5.28)
RDW: 15.7 % — ABNORMAL HIGH (ref 12.3–15.4)
WBC: 7 10*3/uL (ref 3.4–10.8)

## 2015-04-30 ENCOUNTER — Ambulatory Visit
Admission: RE | Admit: 2015-04-30 | Discharge: 2015-04-30 | Disposition: A | Discharge status: Home / Self Care | Payer: Self-pay | Source: Ambulatory Visit | Attending: Nephrology | Admitting: Nephrology

## 2015-04-30 ENCOUNTER — Other Ambulatory Visit: Payer: Self-pay | Admitting: Nephrology

## 2015-04-30 ENCOUNTER — Ambulatory Visit: Payer: No Typology Code available for payment source | Admitting: Family Medicine

## 2015-04-30 ENCOUNTER — Other Ambulatory Visit
Admission: RE | Admit: 2015-04-30 | Discharge: 2015-04-30 | Disposition: A | Discharge status: Home / Self Care | Payer: Self-pay | Source: Ambulatory Visit | Attending: Nephrology | Admitting: Nephrology

## 2015-04-30 DIAGNOSIS — R079 Chest pain, unspecified: Secondary | ICD-10-CM

## 2015-04-30 DIAGNOSIS — R918 Other nonspecific abnormal finding of lung field: Secondary | ICD-10-CM | POA: Insufficient documentation

## 2015-04-30 DIAGNOSIS — I517 Cardiomegaly: Secondary | ICD-10-CM | POA: Insufficient documentation

## 2015-04-30 DIAGNOSIS — N179 Acute kidney failure, unspecified: Secondary | ICD-10-CM | POA: Insufficient documentation

## 2015-04-30 LAB — RENAL FUNCTION PANEL
ANION GAP: 7 (ref 5–15)
Albumin: 3 g/dL — ABNORMAL LOW (ref 3.5–5.0)
BUN: 27 mg/dL — ABNORMAL HIGH (ref 6–20)
CHLORIDE: 91 mmol/L — AB (ref 101–111)
CO2: 34 mmol/L — AB (ref 22–32)
Calcium: 8.4 mg/dL — ABNORMAL LOW (ref 8.9–10.3)
Creatinine, Ser: 1.98 mg/dL — ABNORMAL HIGH (ref 0.44–1.00)
GFR calc non Af Amer: 27 mL/min — ABNORMAL LOW (ref >60)
GFR, EST AFRICAN AMERICAN: 31 mL/min — AB (ref >60)
GLUCOSE: 122 mg/dL — AB (ref 65–99)
POTASSIUM: 3.4 mmol/L — AB (ref 3.5–5.1)
Phosphorus: 2.7 mg/dL (ref 2.5–4.6)
SODIUM: 132 mmol/L — AB (ref 135–145)

## 2015-05-08 ENCOUNTER — Encounter: Payer: Self-pay | Admitting: Primary Care

## 2015-05-08 ENCOUNTER — Ambulatory Visit (INDEPENDENT_AMBULATORY_CARE_PROVIDER_SITE_OTHER): Payer: Self-pay | Admitting: Primary Care

## 2015-05-08 ENCOUNTER — Other Ambulatory Visit: Payer: Self-pay | Admitting: Family Medicine

## 2015-05-08 VITALS — BP 126/84 | HR 74 | Temp 97.9°F | Ht 60.0 in | Wt 129.4 lb

## 2015-05-08 DIAGNOSIS — I7101 Dissection of thoracic aorta: Secondary | ICD-10-CM | POA: Diagnosis not present

## 2015-05-08 DIAGNOSIS — I5022 Chronic systolic (congestive) heart failure: Secondary | ICD-10-CM | POA: Diagnosis not present

## 2015-05-08 DIAGNOSIS — M549 Dorsalgia, unspecified: Secondary | ICD-10-CM | POA: Diagnosis not present

## 2015-05-08 DIAGNOSIS — N184 Chronic kidney disease, stage 4 (severe): Secondary | ICD-10-CM

## 2015-05-08 DIAGNOSIS — I10 Essential (primary) hypertension: Secondary | ICD-10-CM | POA: Diagnosis not present

## 2015-05-08 DIAGNOSIS — S22000G Wedge compression fracture of unspecified thoracic vertebra, subsequent encounter for fracture with delayed healing: Secondary | ICD-10-CM

## 2015-05-08 DIAGNOSIS — IMO0002 Reserved for concepts with insufficient information to code with codable children: Secondary | ICD-10-CM

## 2015-05-08 DIAGNOSIS — I71012 Dissection of descending thoracic aorta: Secondary | ICD-10-CM

## 2015-05-08 DIAGNOSIS — S32000G Wedge compression fracture of unspecified lumbar vertebra, subsequent encounter for fracture with delayed healing: Secondary | ICD-10-CM

## 2015-05-08 DIAGNOSIS — F1120 Opioid dependence, uncomplicated: Secondary | ICD-10-CM

## 2015-05-08 DIAGNOSIS — G8929 Other chronic pain: Secondary | ICD-10-CM

## 2015-05-08 DIAGNOSIS — E1122 Type 2 diabetes mellitus with diabetic chronic kidney disease: Secondary | ICD-10-CM

## 2015-05-08 MED ORDER — MORPHINE SULFATE ER 15 MG PO TBCR: 15.0000 mg | EXTENDED_RELEASE_TABLET | Freq: Two times a day (BID) | ORAL | Status: DC

## 2015-05-08 MED ORDER — OXYCODONE HCL 20 MG PO TABS: 20.0000 mg | ORAL_TABLET | Freq: Three times a day (TID) | ORAL | Status: DC

## 2015-05-08 NOTE — Patient Instructions (Signed)
I have provided a refill of your MS Contin, but will have you follow with pain management for further refills as I do not manage this medication.  I will obtain your records and contact you regarding follow up.  It was a pleasure to meet you today! Please don't hesitate to call me with any questions. Welcome to [facility]!

## 2015-05-08 NOTE — Progress Notes (Signed)
Pre visit review using our clinic review tool, if applicable. No additional management support is needed unless otherwise documented below in the visit note. 
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
## 2015-05-09 NOTE — Assessment & Plan Note (Signed)
Follows with Cardiology. Recent gain of 6 pounds, but also recent loss of 30 pounds from fluid loss. Currently managed on torsemide.

## 2015-05-09 NOTE — Assessment & Plan Note (Signed)
Follows with nephrology (Dr. Thedore Mins). Recent CMP with GFR of 31 and creatinine of 1.9.

## 2015-05-09 NOTE — Assessment & Plan Note (Signed)
Currently working to follow with orthopedist at [facility]. She is on chronic pain medication (oxycodone 20 mg and MS contin) that was provided by her prior PCP. She is nearly out of her MS Contin and is very frustrated with her prior PCP office as she states they refused to fill.  Looked her up on the [place] Controlled Substance Registry which shows consistent prescription refills of meds. No abnormal activity. Recently had filled oxycodone 20 mg from prior PCP.  Discussed that I do not manage these medication and that she will need to establish with pain management for further evaluation. Did provide 1 refill of MS contin as she will run out tomorrow. Explained this was a one time prescription.  Unsure if she will continue to follow with prior PCP as she mentioned he will return from his leave shortly. Explained she cannot jump back and forth to various PCP offices. We are happy to see her but this behavior is suspicious.  She verbalized understanding.

## 2015-05-09 NOTE — Assessment & Plan Note (Signed)
Follows with Dr. Dorris Fetch. Endorses stable evaluation recently. Strict control of BP.

## 2015-05-09 NOTE — Assessment & Plan Note (Signed)
A1C in July 2016 of 6.2. Will continue to monitor. Will obtain records.

## 2015-05-09 NOTE — Assessment & Plan Note (Signed)
-   Stable today. Continue current medications.

## 2015-05-09 NOTE — Assessment & Plan Note (Signed)
1 time refill of MS contin provided as she is nearly out. Discussed that she will need to establish with pain management for further refills as I do not manage. Referral placed to pain management.

## 2015-05-20 ENCOUNTER — Telehealth: Payer: Self-pay | Admitting: Primary Care

## 2015-05-20 NOTE — Telephone Encounter (Signed)
I received a referral note back from pain management which states that she was supposed to call them to schedule an appointment several months back. Please remind her that I cannot manage her pain medication and that she will need to schedule an appointment with pain management for further refills.  Thanks.

## 2015-05-21 NOTE — Telephone Encounter (Signed)
Called and notified patient of Kate's comments. Patient verbalized understanding. Patient stated she will set up appointment with pain management. She is just having some issues with insurance but will do it.

## 2015-05-25 ENCOUNTER — Encounter: Payer: Self-pay | Admitting: Urgent Care

## 2015-05-25 ENCOUNTER — Emergency Department: Payer: No Typology Code available for payment source

## 2015-05-25 ENCOUNTER — Emergency Department
Admission: EM | Admit: 2015-05-25 | Discharge: 2015-05-26 | Disposition: A | Discharge status: Home / Self Care | Payer: Self-pay | Attending: Emergency Medicine | Admitting: Emergency Medicine

## 2015-05-25 DIAGNOSIS — I10 Essential (primary) hypertension: Secondary | ICD-10-CM

## 2015-05-25 DIAGNOSIS — F17210 Nicotine dependence, cigarettes, uncomplicated: Secondary | ICD-10-CM | POA: Insufficient documentation

## 2015-05-25 DIAGNOSIS — Z79891 Long term (current) use of opiate analgesic: Secondary | ICD-10-CM | POA: Insufficient documentation

## 2015-05-25 DIAGNOSIS — T148XXA Other injury of unspecified body region, initial encounter: Secondary | ICD-10-CM

## 2015-05-25 DIAGNOSIS — E041 Nontoxic single thyroid nodule: Secondary | ICD-10-CM | POA: Insufficient documentation

## 2015-05-25 DIAGNOSIS — Y92410 Unspecified street and highway as the place of occurrence of the external cause: Secondary | ICD-10-CM | POA: Insufficient documentation

## 2015-05-25 DIAGNOSIS — Y9389 Activity, other specified: Secondary | ICD-10-CM | POA: Insufficient documentation

## 2015-05-25 DIAGNOSIS — Y998 Other external cause status: Secondary | ICD-10-CM | POA: Insufficient documentation

## 2015-05-25 DIAGNOSIS — N184 Chronic kidney disease, stage 4 (severe): Secondary | ICD-10-CM | POA: Insufficient documentation

## 2015-05-25 DIAGNOSIS — S29012A Strain of muscle and tendon of back wall of thorax, initial encounter: Secondary | ICD-10-CM | POA: Insufficient documentation

## 2015-05-25 DIAGNOSIS — E1122 Type 2 diabetes mellitus with diabetic chronic kidney disease: Secondary | ICD-10-CM | POA: Insufficient documentation

## 2015-05-25 DIAGNOSIS — S199XXA Unspecified injury of neck, initial encounter: Secondary | ICD-10-CM | POA: Insufficient documentation

## 2015-05-25 DIAGNOSIS — Z79899 Other long term (current) drug therapy: Secondary | ICD-10-CM | POA: Insufficient documentation

## 2015-05-25 DIAGNOSIS — G8929 Other chronic pain: Secondary | ICD-10-CM | POA: Insufficient documentation

## 2015-05-25 DIAGNOSIS — I129 Hypertensive chronic kidney disease with stage 1 through stage 4 chronic kidney disease, or unspecified chronic kidney disease: Secondary | ICD-10-CM | POA: Insufficient documentation

## 2015-05-25 DIAGNOSIS — M549 Dorsalgia, unspecified: Secondary | ICD-10-CM

## 2015-05-25 MED ORDER — MORPHINE SULFATE (PF) 4 MG/ML IV SOLN
8.0000 mg | INTRAVENOUS | Status: AC
  Administered 2015-05-25: 8 mg via INTRAMUSCULAR
  Filled 2015-05-25: qty 2
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
## 2015-05-25 NOTE — ED Notes (Signed)
Patient transported to CT 

## 2015-05-25 NOTE — ED Notes (Signed)
Pt requesting pain medication.  EDP made aware.  
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
## 2015-05-26 ENCOUNTER — Emergency Department: Payer: No Typology Code available for payment source

## 2015-05-26 MED ORDER — CLONIDINE HCL 0.1 MG PO TABS
0.1000 mg | ORAL_TABLET | ORAL | Status: AC
  Administered 2015-05-26: 0.1 mg via ORAL
  Filled 2015-05-26: qty 1

## 2015-05-26 MED ORDER — MORPHINE SULFATE ER 15 MG PO TBCR
15.0000 mg | EXTENDED_RELEASE_TABLET | ORAL | Status: AC
  Administered 2015-05-26: 15 mg via ORAL
  Filled 2015-05-26: qty 1
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
## 2015-05-29 ENCOUNTER — Ambulatory Visit
Admission: RE | Admit: 2015-05-29 | Discharge: 2015-05-29 | Disposition: A | Discharge status: Home / Self Care | Payer: Self-pay | Source: Ambulatory Visit | Attending: Nephrology | Admitting: Nephrology

## 2015-05-29 DIAGNOSIS — Z79899 Other long term (current) drug therapy: Secondary | ICD-10-CM | POA: Insufficient documentation

## 2015-05-29 MED ORDER — SODIUM CHLORIDE 0.9 % IV SOLN
510.0000 mg | Freq: Once | INTRAVENOUS | Status: AC
  Administered 2015-05-29: 510 mg via INTRAVENOUS
  Filled 2015-05-29: qty 17

## 2015-05-29 NOTE — OR Nursing (Signed)
Condition stable.  V/s Stable.  No allergic reaction noted.  Patient is instructed to return in 2 weeks for 2nd dose. Verbalizes understanding.

## 2015-05-29 NOTE — OR Nursing (Signed)
Feraheme complete.  Vs stable

## 2015-05-29 NOTE — OR Nursing (Signed)
Saline lock started.  Feraheme infusion started

## 2015-05-30 ENCOUNTER — Ambulatory Visit: Payer: No Typology Code available for payment source | Admitting: Family Medicine

## 2015-06-07 ENCOUNTER — Encounter: Payer: Self-pay | Admitting: Cardiovascular Disease

## 2015-06-07 ENCOUNTER — Ambulatory Visit (INDEPENDENT_AMBULATORY_CARE_PROVIDER_SITE_OTHER): Payer: Self-pay | Admitting: Cardiovascular Disease

## 2015-06-07 ENCOUNTER — Ambulatory Visit: Payer: Self-pay | Admitting: Cardiovascular Disease

## 2015-06-07 VITALS — BP 150/100 | HR 94 | Ht 63.0 in | Wt 139.2 lb

## 2015-06-07 DIAGNOSIS — I5032 Chronic diastolic (congestive) heart failure: Secondary | ICD-10-CM

## 2015-06-07 DIAGNOSIS — I7101 Dissection of thoracic aorta: Secondary | ICD-10-CM

## 2015-06-07 DIAGNOSIS — I10 Essential (primary) hypertension: Secondary | ICD-10-CM

## 2015-06-07 DIAGNOSIS — I272 Other secondary pulmonary hypertension: Secondary | ICD-10-CM

## 2015-06-07 DIAGNOSIS — I5043 Acute on chronic combined systolic (congestive) and diastolic (congestive) heart failure: Secondary | ICD-10-CM

## 2015-06-07 DIAGNOSIS — R0602 Shortness of breath: Secondary | ICD-10-CM

## 2015-06-07 DIAGNOSIS — R609 Edema, unspecified: Secondary | ICD-10-CM

## 2015-06-07 DIAGNOSIS — I71012 Dissection of descending thoracic aorta: Secondary | ICD-10-CM

## 2015-06-07 DIAGNOSIS — I429 Cardiomyopathy, unspecified: Secondary | ICD-10-CM

## 2015-06-07 DIAGNOSIS — I428 Other cardiomyopathies: Secondary | ICD-10-CM

## 2015-06-07 DIAGNOSIS — G894 Chronic pain syndrome: Secondary | ICD-10-CM

## 2015-06-07 DIAGNOSIS — Z720 Tobacco use: Secondary | ICD-10-CM

## 2015-06-07 DIAGNOSIS — R600 Localized edema: Secondary | ICD-10-CM

## 2015-06-07 MED ORDER — CLONIDINE HCL 0.1 MG PO TABS: 0.1000 mg | ORAL_TABLET | Freq: Two times a day (BID) | ORAL | Status: DC

## 2015-06-07 MED ORDER — LABETALOL HCL 300 MG PO TABS: 300.0000 mg | ORAL_TABLET | Freq: Three times a day (TID) | ORAL | Status: DC

## 2015-06-07 NOTE — Patient Instructions (Signed)
Please increase the labetolol up to three a day Also start clonidine 0.1 mg twice as day  Please monitor your blood pressure  Please call us if you have new issues that need to be addressed before your next appt.  Your physician wants you to follow-up in: 3 months.  You will receive a reminder letter in the mail two months in advance. If you don't receive a letter, please call our office to schedule the follow-up appointment.

## 2015-06-07 NOTE — Assessment & Plan Note (Signed)
Leg edema is significant, dramatic pitting edema, concerning for lymphedema. She does report blistering, recent wound requiring wrapping. Vein and vascular has kindly agreed to see her, maybe a candidate for lymphedema compression pumps

## 2015-06-07 NOTE — Assessment & Plan Note (Signed)
4 cm on recent CT scan in 2016 Stressed importance of aggressive blood pressure control   Total encounter time more than 25 minutes  Greater than 50% was spent in counseling and coordination of care with the patient
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
## 2015-06-07 NOTE — Assessment & Plan Note (Signed)
Blood pressure elevated on today's visit She is not taking clonidine, only takes labetalol 300 mg twice a day with Aldactone Recommended she increase labetalol up to 300 mg 3 times a day, would  Restart clonidine 0.1 mg twice a day. We will pursue aggressive blood pressure control given her aneurysm and progression on CT scan

## 2015-06-07 NOTE — Assessment & Plan Note (Signed)
We have encouraged her to continue to work on weaning her cigarettes and smoking cessation. She will continue to work on this and does not want any assistance with chantix.  

## 2015-06-07 NOTE — Assessment & Plan Note (Signed)
On torsemide daily Diuretic regiment managed by nephrology overdiuresis leading to renal failure Leg edema out of proportion to her cardiac issues, likely from lymphedema

## 2015-06-07 NOTE — Assessment & Plan Note (Signed)
Shortness of breath likely multifactorial including long history of smoking, emphysema/COPD, diastolic CHF, deconditioning

## 2015-06-12 ENCOUNTER — Ambulatory Visit
Admission: RE | Admit: 2015-06-12 | Discharge: 2015-06-12 | Disposition: A | Discharge status: Home / Self Care | Payer: Self-pay | Source: Ambulatory Visit | Attending: Nephrology | Admitting: Nephrology

## 2015-06-12 DIAGNOSIS — D509 Iron deficiency anemia, unspecified: Secondary | ICD-10-CM | POA: Insufficient documentation

## 2015-06-12 MED ORDER — SODIUM CHLORIDE 0.9 % IV SOLN
510.0000 mg | Freq: Once | INTRAVENOUS | Status: AC
  Administered 2015-06-12: 510 mg via INTRAVENOUS
  Filled 2015-06-12: qty 17

## 2015-06-12 NOTE — OR Nursing (Signed)
Infusion complete at 1405.  No s/x reaction. Will cont to monitor until 1435

## 2015-06-19 ENCOUNTER — Emergency Department: Payer: No Typology Code available for payment source

## 2015-06-19 ENCOUNTER — Other Ambulatory Visit: Payer: Self-pay

## 2015-06-19 ENCOUNTER — Encounter: Payer: Self-pay | Admitting: Emergency Medicine

## 2015-06-19 ENCOUNTER — Emergency Department
Admission: EM | Admit: 2015-06-19 | Discharge: 2015-06-19 | Discharge status: Against Medical Advice | Payer: No Typology Code available for payment source | Attending: Emergency Medicine | Admitting: Emergency Medicine

## 2015-06-19 ENCOUNTER — Telehealth: Payer: Self-pay | Admitting: *Deleted

## 2015-06-19 DIAGNOSIS — N184 Chronic kidney disease, stage 4 (severe): Secondary | ICD-10-CM | POA: Insufficient documentation

## 2015-06-19 DIAGNOSIS — I5042 Chronic combined systolic (congestive) and diastolic (congestive) heart failure: Secondary | ICD-10-CM | POA: Insufficient documentation

## 2015-06-19 DIAGNOSIS — I110 Hypertensive heart disease with heart failure: Secondary | ICD-10-CM | POA: Insufficient documentation

## 2015-06-19 DIAGNOSIS — E1122 Type 2 diabetes mellitus with diabetic chronic kidney disease: Secondary | ICD-10-CM | POA: Insufficient documentation

## 2015-06-19 DIAGNOSIS — R109 Unspecified abdominal pain: Secondary | ICD-10-CM | POA: Insufficient documentation

## 2015-06-19 DIAGNOSIS — N179 Acute kidney failure, unspecified: Secondary | ICD-10-CM | POA: Insufficient documentation

## 2015-06-19 DIAGNOSIS — I422 Other hypertrophic cardiomyopathy: Secondary | ICD-10-CM | POA: Insufficient documentation

## 2015-06-19 DIAGNOSIS — F17210 Nicotine dependence, cigarettes, uncomplicated: Secondary | ICD-10-CM | POA: Insufficient documentation

## 2015-06-19 DIAGNOSIS — R140 Abdominal distension (gaseous): Secondary | ICD-10-CM

## 2015-06-19 DIAGNOSIS — D649 Anemia, unspecified: Secondary | ICD-10-CM | POA: Insufficient documentation

## 2015-06-19 DIAGNOSIS — Z79899 Other long term (current) drug therapy: Secondary | ICD-10-CM | POA: Insufficient documentation

## 2015-06-19 DIAGNOSIS — E669 Obesity, unspecified: Secondary | ICD-10-CM | POA: Insufficient documentation

## 2015-06-19 DIAGNOSIS — R079 Chest pain, unspecified: Secondary | ICD-10-CM

## 2015-06-19 DIAGNOSIS — E7800 Pure hypercholesterolemia, unspecified: Secondary | ICD-10-CM | POA: Insufficient documentation

## 2015-06-19 LAB — CBC
HCT: 24.3 % — ABNORMAL LOW (ref 35.0–47.0)
HEMOGLOBIN: 8 g/dL — AB (ref 12.0–16.0)
MCH: 29 pg (ref 26.0–34.0)
MCHC: 33 g/dL (ref 32.0–36.0)
MCV: 87.8 fL (ref 80.0–100.0)
PLATELETS: 261 10*3/uL (ref 150–440)
RBC: 2.77 MIL/uL — AB (ref 3.80–5.20)
RDW: 23 % — ABNORMAL HIGH (ref 11.5–14.5)
WBC: 8.2 10*3/uL (ref 3.6–11.0)

## 2015-06-19 LAB — BASIC METABOLIC PANEL
ANION GAP: 6 (ref 5–15)
BUN: 55 mg/dL — ABNORMAL HIGH (ref 6–20)
CALCIUM: 8 mg/dL — AB (ref 8.9–10.3)
CO2: 17 mmol/L — AB (ref 22–32)
CREATININE: 4.33 mg/dL — AB (ref 0.44–1.00)
Chloride: 114 mmol/L — ABNORMAL HIGH (ref 101–111)
GFR, EST AFRICAN AMERICAN: 12 mL/min — AB (ref >60)
GFR, EST NON AFRICAN AMERICAN: 11 mL/min — AB (ref >60)
Glucose, Bld: 90 mg/dL (ref 65–99)
Potassium: 3.2 mmol/L — ABNORMAL LOW (ref 3.5–5.1)
Sodium: 137 mmol/L (ref 135–145)

## 2015-06-19 LAB — TROPONIN I

## 2015-06-19 LAB — TYPE AND SCREEN
ABO/RH(D): B POS
ANTIBODY SCREEN: NEGATIVE

## 2015-06-19 LAB — ABO/RH: ABO/RH(D): B POS

## 2015-06-19 LAB — BRAIN NATRIURETIC PEPTIDE: B NATRIURETIC PEPTIDE 5: 582 pg/mL — AB (ref 0.0–100.0)

## 2015-06-19 MED ORDER — LABETALOL HCL 5 MG/ML IV SOLN
10.0000 mg | Freq: Once | INTRAVENOUS | Status: AC
  Administered 2015-06-19: 10 mg via INTRAVENOUS
  Filled 2015-06-19: qty 4

## 2015-06-19 MED ORDER — FENTANYL CITRATE (PF) 100 MCG/2ML IJ SOLN
25.0000 ug | Freq: Once | INTRAMUSCULAR | Status: AC
  Administered 2015-06-19: 25 ug via INTRAVENOUS
  Filled 2015-06-19: qty 2

## 2015-06-19 NOTE — Discharge Instructions (Signed)
Today you're leaving AGAINST MEDICAL ADVICE. This could result in permanent damage to your heart, aorta, or kidneys or other vital organs. It can also result in death.  Please make an appointment to see your primary care physician tomorrow.  Return to the emergency department if you have lightheadedness or fainting, chest pain, shortness of breath, or any other symptoms concerning to you.  Acute Kidney Injury Acute kidney injury is any condition in which there is sudden (acute) damage to the kidneys. Acute kidney injury was previously known as acute kidney failure or acute renal failure. The kidneys are two organs that lie on either side of the spine between the middle of the back and the front of the abdomen. The kidneys:  Remove wastes and extra water from the blood.   Produce important hormones. These help keep bones strong, regulate blood pressure, and help create red blood cells.   Balance the fluids and chemicals in the blood and tissues. A small amount of kidney damage may not cause problems, but a large amount of damage may make it difficult or impossible for the kidneys to work the way they should. Acute kidney injury may develop into long-lasting (chronic) kidney disease. It may also develop into a life-threatening disease called end-stage kidney disease. Acute kidney injury can get worse very quickly, so it should be treated right away. Early treatment may prevent other kidney diseases from developing. CAUSES   A problem with blood flow to the kidneys. This may be caused by:   Blood loss.   Heart disease.   Severe burns.   Liver disease.  Direct damage to the kidneys. This may be caused by:  Some medicines.   A kidney infection.   Poisoning or consuming toxic substances.   A surgical wound.   A blow to the kidney area.   A problem with urine flow. This may be caused by:   Cancer.   Kidney stones.   An enlarged prostate. SIGNS AND SYMPTOMS    Swelling (edema) of the legs, ankles, or feet.   Tiredness (lethargy).   Nausea or vomiting.   Confusion.   Problems with urination, such as:   Painful or burning feeling during urination.   Decreased urine production.   Frequent accidents in children who are potty trained.   Bloody urine.   Muscle twitches and cramps.   Shortness of breath.   Seizures.   Chest pain or pressure. Sometimes, no symptoms are present. DIAGNOSIS Acute kidney injury may be detected and diagnosed by tests, including blood, urine, imaging, or kidney biopsy tests.  TREATMENT Treatment of acute kidney injury varies depending on the cause and severity of the kidney damage. In mild cases, no treatment may be needed. The kidneys may heal on their own. If acute kidney injury is more severe, your health care provider will treat the cause of the kidney damage, help the kidneys heal, and prevent complications from occurring. Severe cases may require a procedure to remove toxic wastes from the body (dialysis) or surgery to repair kidney damage. Surgery may involve:   Repair of a torn kidney.   Removal of an obstruction. HOME CARE INSTRUCTIONS  Follow your prescribed diet.  Take medicines only as directed by your health care provider.  Do not take any new medicines (prescription, over-the-counter, or nutritional supplements) unless approved by your health care provider. Many medicines can worsen your kidney damage or may need to have the dose adjusted.   Keep all follow-up visits as directed by  your health care provider. This is important.  Observe your condition to make sure you are healing as expected. SEEK IMMEDIATE MEDICAL CARE IF:  You are feeling ill or have severe pain in the back or side.   Your symptoms return or you have new symptoms.  You have any symptoms of end-stage kidney disease. These include:   Persistent itchiness.   Loss of appetite.   Headaches.    Abnormally dark or light skin.  Numbness in the hands or feet.   Easy bruising.   Frequent hiccups.   Menstruation stops.   You have a fever.  You have increased urine production.  You have pain or bleeding when urinating. MAKE SURE YOU:   Understand these instructions.  Will watch your condition.  Will get help right away if you are not doing well or get worse.   This information is not intended to replace advice given to you by your health care provider. Make sure you discuss any questions you have with your health care provider.   Document Released: 09/23/2010 Document Revised: 03/31/2014 Document Reviewed: 11/07/2011 Elsevier Interactive Patient Education [employer]
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
## 2015-06-19 NOTE — Telephone Encounter (Signed)
Patient called for ER follow up.  She was evaluated at [facility] ED today for  severe chest and back pain, abdominal pain and distention, bilateral lower extremity pain.  With h/o thoracic and abdominal aneurysms it was advised that she be transferred for treatment as aortic dissections could not be treated at [facility].  Transfer was put on hold until a CT was performed - the patient has an allergy to dye.  In the meantime, she left [facility] AMA.  I spoke to the patient in detail about the need for treatment at a hospital asap.  I encouraged her to go to [facility] or [facility] now, whichever she preferred.  She refuses and says she only wants to come here and that she will make an appointment with Mayra Reel when she returns to the office on 3/30.  I discouraged waiting but the patient was adamant that she would not go back to the hospital.  I placed the patient on hold and discussed this with Dr. Sharen Hones who agrees the patient needs to be treated in the hospital.  The patient disconnected the call before I got back on the line.  I made multiple attempts to reach the patient without success.  No appointment was scheduled.
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
## 2015-06-19 NOTE — ED Notes (Signed)
Pt resting in bed, friend at bedside, pt awake and alert, pt updated on plan of care as to transferring to another facility, pt continues to take off BP cuff, pt educated on the importance of watching BP, will continue to monitor closely

## 2015-06-19 NOTE — ED Notes (Signed)
Pt to leave AMA, discussed risks with pt as to leaving, MD discussed risks with pt, pt clear in her decision to leave AMA

## 2015-06-19 NOTE — ED Notes (Signed)
EDP at bedside  
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
## 2015-06-20 NOTE — Telephone Encounter (Signed)
Forwarding to [place] to handle from this point. Thanks!

## 2015-06-20 NOTE — Telephone Encounter (Signed)
Reviewed ER note and labs. She also had acute on chronic kidney failure with Cr 4.33 (from 1.9) For this reason alone recommend eval at hospital but pt declined yesterday. plz call for update and encourage ER eval.  If continued refusal plz schedule next available appt with provider and encourage her to f/u with Dr Thedore Mins her nephrologist.

## 2015-06-20 NOTE — Telephone Encounter (Signed)
Finally able to leave message for patient to return my call.

## 2015-06-20 NOTE — Telephone Encounter (Signed)
Attempted to call again. Mailbox still full. Will try again later.

## 2015-06-20 NOTE — Telephone Encounter (Signed)
I also agreed that she needs immediate evaluation in the ED. Please attempt to contact patient today to encourage evaluation in ED or ASAP in our clinic with first available provider.

## 2015-06-20 NOTE — Telephone Encounter (Signed)
Attempted to call patient. No answer and mailbox is full. Unable to leave message. Will try again later.

## 2015-06-21 NOTE — Telephone Encounter (Signed)
I discussed with patient her condition.  She is aware that she is in need of immediate follow up at an emergency department.  I strongly urged patient to seek care.  She states she understands her risks and continues to decline treatment at this time because she is feeling much better.  I encouraged her to seek care if her condition worsens, she agrees.

## 2015-06-21 NOTE — Telephone Encounter (Signed)
Called patient and notified her that she need to immediate evaluation at ED. Informed her of comments below. Patient refuse to go to ED. Transfer call to [place].

## 2015-06-21 NOTE — Telephone Encounter (Signed)
Noted  

## 2015-07-12 ENCOUNTER — Other Ambulatory Visit: Payer: Self-pay | Admitting: Family Medicine

## 2015-07-17 ENCOUNTER — Other Ambulatory Visit: Payer: Self-pay | Admitting: Family Medicine

## 2015-07-25 ENCOUNTER — Ambulatory Visit (INDEPENDENT_AMBULATORY_CARE_PROVIDER_SITE_OTHER)
Admission: RE | Admit: 2015-07-25 | Discharge: 2015-07-25 | Disposition: A | Discharge status: Home / Self Care | Payer: BLUE CROSS/BLUE SHIELD | Source: Ambulatory Visit | Attending: Primary Care | Admitting: Primary Care

## 2015-07-25 ENCOUNTER — Telehealth: Payer: Self-pay | Admitting: Cardiovascular Disease

## 2015-07-25 ENCOUNTER — Encounter: Payer: Self-pay | Admitting: Primary Care

## 2015-07-25 ENCOUNTER — Ambulatory Visit (INDEPENDENT_AMBULATORY_CARE_PROVIDER_SITE_OTHER): Payer: BLUE CROSS/BLUE SHIELD | Admitting: Primary Care

## 2015-07-25 ENCOUNTER — Other Ambulatory Visit: Payer: Self-pay | Admitting: Primary Care

## 2015-07-25 VITALS — BP 172/122 | HR 86 | Temp 98.2°F | Ht 60.0 in | Wt 136.4 lb

## 2015-07-25 DIAGNOSIS — D649 Anemia, unspecified: Secondary | ICD-10-CM | POA: Diagnosis not present

## 2015-07-25 DIAGNOSIS — G8929 Other chronic pain: Secondary | ICD-10-CM

## 2015-07-25 DIAGNOSIS — N289 Disorder of kidney and ureter, unspecified: Secondary | ICD-10-CM | POA: Diagnosis not present

## 2015-07-25 DIAGNOSIS — R399 Unspecified symptoms and signs involving the genitourinary system: Secondary | ICD-10-CM

## 2015-07-25 DIAGNOSIS — I5022 Chronic systolic (congestive) heart failure: Secondary | ICD-10-CM

## 2015-07-25 DIAGNOSIS — R0781 Pleurodynia: Secondary | ICD-10-CM

## 2015-07-25 DIAGNOSIS — M5441 Lumbago with sciatica, right side: Secondary | ICD-10-CM

## 2015-07-25 DIAGNOSIS — M549 Dorsalgia, unspecified: Secondary | ICD-10-CM | POA: Diagnosis not present

## 2015-07-25 DIAGNOSIS — F1120 Opioid dependence, uncomplicated: Secondary | ICD-10-CM

## 2015-07-25 DIAGNOSIS — I10 Essential (primary) hypertension: Secondary | ICD-10-CM

## 2015-07-25 DIAGNOSIS — N184 Chronic kidney disease, stage 4 (severe): Secondary | ICD-10-CM

## 2015-07-25 MED ORDER — CYCLOBENZAPRINE HCL 10 MG PO TABS: 10.0000 mg | ORAL_TABLET | Freq: Every day | ORAL | Status: AC

## 2015-07-25 MED ORDER — CYCLOBENZAPRINE HCL 10 MG PO TABS: 10.0000 mg | ORAL_TABLET | Freq: Every day | ORAL | Status: DC

## 2015-07-25 MED ORDER — MORPHINE SULFATE ER 15 MG PO TBCR: 15.0000 mg | EXTENDED_RELEASE_TABLET | Freq: Two times a day (BID) | ORAL | Status: DC

## 2015-07-25 MED ORDER — OXYCODONE HCL 20 MG PO TABS: 20.0000 mg | ORAL_TABLET | Freq: Three times a day (TID) | ORAL | Status: DC

## 2015-07-25 NOTE — Patient Instructions (Signed)
Complete lab work prior to leaving today. I will notify you of your results once received.   Complete xray(s) prior to leaving today. I will notify you of your results once received.  Please follow up with psychiatry and pain management as discussed. I cannot manage these pain medications long term.  Please call your cardiologist and notify him of your blood pressure and swelling as directed.  Follow up in 3 months for re-evaluation.  It was a pleasure to see you today!

## 2015-07-25 NOTE — Assessment & Plan Note (Signed)
Acute on chronic renal failure in March 2017. Repeat CMP pending.

## 2015-07-25 NOTE — Assessment & Plan Note (Signed)
Moderate lower extremity edema today. Patient to call cardiologist today.

## 2015-07-25 NOTE — Telephone Encounter (Signed)
Pt caretaker calling stating they just left PCP office  Wanted to call us and let us know BP IS 177/72 and legs are swollen  Pt c/o swelling: STAT is pt has developed SOB within 24 hours  1. How long have you been experiencing swelling? 2 weeks  2. Where is the swelling located? Legs   3.  Are you currently taking a "fluid pill"? yes  4.  Are you currently SOB?  If she's walking yes but sitting not as much   5.  Have you traveled recently? No

## 2015-07-25 NOTE — Assessment & Plan Note (Addendum)
Uncontrolled today and has been for 3 weeks per patients home readings. Out of pain medication for 4 days, also likely contributing today. Long discussion today regarding importance of strict BP control given chronic medical conditions. She will call her cardiologist today with an update. Refill of pain medication provided. CMP pending.
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
## 2015-07-25 NOTE — Assessment & Plan Note (Signed)
She is to be seeing psychiatry and likely pain management in 1 week. Refill provided today until she can get established.

## 2015-07-25 NOTE — Progress Notes (Signed)
Pre visit review using our clinic review tool, if applicable. No additional management support is needed unless otherwise documented below in the visit note. 

## 2015-07-26 ENCOUNTER — Ambulatory Visit (INDEPENDENT_AMBULATORY_CARE_PROVIDER_SITE_OTHER): Payer: BLUE CROSS/BLUE SHIELD | Admitting: Nurse Practitioner

## 2015-07-26 ENCOUNTER — Encounter: Payer: Self-pay | Admitting: Nurse Practitioner

## 2015-07-26 ENCOUNTER — Telehealth: Payer: Self-pay | Admitting: Primary Care

## 2015-07-26 VITALS — BP 136/108 | HR 81 | Ht 60.0 in | Wt 138.0 lb

## 2015-07-26 DIAGNOSIS — N184 Chronic kidney disease, stage 4 (severe): Secondary | ICD-10-CM | POA: Diagnosis not present

## 2015-07-26 DIAGNOSIS — I429 Cardiomyopathy, unspecified: Secondary | ICD-10-CM | POA: Diagnosis not present

## 2015-07-26 DIAGNOSIS — I7101 Dissection of thoracic aorta: Secondary | ICD-10-CM | POA: Diagnosis not present

## 2015-07-26 DIAGNOSIS — I5032 Chronic diastolic (congestive) heart failure: Secondary | ICD-10-CM | POA: Diagnosis not present

## 2015-07-26 DIAGNOSIS — I422 Other hypertrophic cardiomyopathy: Secondary | ICD-10-CM

## 2015-07-26 DIAGNOSIS — I71012 Dissection of descending thoracic aorta: Secondary | ICD-10-CM

## 2015-07-26 DIAGNOSIS — I110 Hypertensive heart disease with heart failure: Secondary | ICD-10-CM

## 2015-07-26 LAB — CBC
HCT: 26.5 % — ABNORMAL LOW (ref 36.0–46.0)
Hemoglobin: 8.8 g/dL — ABNORMAL LOW (ref 12.0–15.0)
MCHC: 33.4 g/dL (ref 30.0–36.0)
MCV: 91.5 fl (ref 78.0–100.0)
PLATELETS: 192 10*3/uL (ref 150.0–400.0)
RBC: 2.89 Mil/uL — AB (ref 3.87–5.11)
RDW: 18.2 % — ABNORMAL HIGH (ref 11.5–15.5)
WBC: 7.3 10*3/uL (ref 4.0–10.5)

## 2015-07-26 LAB — COMPREHENSIVE METABOLIC PANEL
ALK PHOS: 76 U/L (ref 39–117)
ALT: 11 U/L (ref 0–35)
AST: 11 U/L (ref 0–37)
Albumin: 3.6 g/dL (ref 3.5–5.2)
BILIRUBIN TOTAL: 0.5 mg/dL (ref 0.2–1.2)
BUN: 26 mg/dL — ABNORMAL HIGH (ref 6–23)
CO2: 29 mEq/L (ref 19–32)
CREATININE: 1.95 mg/dL — AB (ref 0.40–1.20)
Calcium: 8.8 mg/dL (ref 8.4–10.5)
Chloride: 102 mEq/L (ref 96–112)
GFR: 34.06 mL/min — AB (ref >60.00)
Glucose, Bld: 110 mg/dL — ABNORMAL HIGH (ref 70–99)
Potassium: 4.5 mEq/L (ref 3.5–5.1)
Sodium: 139 mEq/L (ref 135–145)
Total Protein: 6.5 g/dL (ref 6.0–8.3)

## 2015-07-26 MED ORDER — CLONIDINE HCL 0.2 MG PO TABS: 0.2000 mg | ORAL_TABLET | Freq: Two times a day (BID) | ORAL | Status: AC
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
## 2015-07-26 NOTE — Telephone Encounter (Signed)
Pt returned your call.  

## 2015-07-26 NOTE — Patient Instructions (Addendum)
Medication Instructions:  Please increase your clonidine to 0.2 mg twice daily  Labwork: None  Testing/Procedures: None  Follow-Up: Follow up w/ your nephrologist as soon as possible  If you need a refill on your cardiac medications before your next appointment, please call your pharmacy.

## 2015-07-26 NOTE — Telephone Encounter (Signed)
Can we see if she can come in to see Alycia Rossetti today?

## 2015-07-26 NOTE — Telephone Encounter (Signed)
Called patient and she is coming today at 10 am to see Ward Givens

## 2015-07-27 NOTE — Telephone Encounter (Signed)
Called and notified patient of Kate's comments. Patient verbalized understanding.  

## 2015-08-14 ENCOUNTER — Other Ambulatory Visit: Payer: Self-pay | Admitting: *Deleted

## 2015-08-14 DIAGNOSIS — I71019 Dissection of thoracic aorta, unspecified: Secondary | ICD-10-CM

## 2015-08-14 DIAGNOSIS — I7101 Dissection of thoracic aorta: Secondary | ICD-10-CM

## 2015-08-21 ENCOUNTER — Other Ambulatory Visit: Payer: Self-pay | Admitting: *Deleted

## 2015-08-21 ENCOUNTER — Telehealth: Payer: Self-pay | Admitting: Cardiovascular Disease

## 2015-08-21 ENCOUNTER — Telehealth: Payer: Self-pay | Admitting: *Deleted

## 2015-08-21 MED ORDER — LABETALOL HCL 300 MG PO TABS: 300.0000 mg | ORAL_TABLET | Freq: Three times a day (TID) | ORAL | Status: AC

## 2015-08-21 NOTE — Telephone Encounter (Signed)
Requested Prescriptions   Signed Prescriptions Disp Refills  . labetalol (NORMODYNE) 300 MG tablet 270 tablet 3    Sig: Take 1 tablet (300 mg total) by mouth 3 (three) times daily.    Authorizing Provider: Antonieta Iba    Ordering User: Kendrick Fries

## 2015-08-21 NOTE — Telephone Encounter (Signed)
°*  STAT* If patient is at the pharmacy, call can be transferred to refill team.   1. Which medications need to be refilled? (please list name of each medication and dose if known) Labetalol   2. Which pharmacy/location (including street and city if local pharmacy) is medication to be sent to? [facility] [address]   3. Do they need a 30 day or 90 day supply? 90 day

## 2015-08-21 NOTE — Telephone Encounter (Signed)
Noted and agree to ED evaluation.  
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
## 2015-08-22 ENCOUNTER — Encounter: Payer: Self-pay | Admitting: Primary Care

## 2015-08-22 ENCOUNTER — Ambulatory Visit (INDEPENDENT_AMBULATORY_CARE_PROVIDER_SITE_OTHER)
Admission: RE | Admit: 2015-08-22 | Discharge: 2015-08-22 | Disposition: A | Discharge status: Home / Self Care | Payer: BLUE CROSS/BLUE SHIELD | Source: Ambulatory Visit | Attending: Primary Care | Admitting: Primary Care

## 2015-08-22 ENCOUNTER — Other Ambulatory Visit: Payer: Self-pay | Admitting: Primary Care

## 2015-08-22 ENCOUNTER — Ambulatory Visit (INDEPENDENT_AMBULATORY_CARE_PROVIDER_SITE_OTHER): Payer: BLUE CROSS/BLUE SHIELD | Admitting: Primary Care

## 2015-08-22 VITALS — BP 142/88 | HR 87 | Temp 97.6°F | Ht 60.0 in | Wt 122.5 lb

## 2015-08-22 DIAGNOSIS — G8929 Other chronic pain: Secondary | ICD-10-CM

## 2015-08-22 DIAGNOSIS — R079 Chest pain, unspecified: Secondary | ICD-10-CM

## 2015-08-22 DIAGNOSIS — E876 Hypokalemia: Secondary | ICD-10-CM

## 2015-08-22 DIAGNOSIS — M549 Dorsalgia, unspecified: Principal | ICD-10-CM

## 2015-08-22 LAB — COMPREHENSIVE METABOLIC PANEL
ALBUMIN: 3.8 g/dL (ref 3.5–5.2)
ALK PHOS: 75 U/L (ref 39–117)
ALT: 19 U/L (ref 0–35)
AST: 18 U/L (ref 0–37)
BILIRUBIN TOTAL: 0.4 mg/dL (ref 0.2–1.2)
BUN: 48 mg/dL — AB (ref 6–23)
CO2: 36 mEq/L — ABNORMAL HIGH (ref 19–32)
Calcium: 8.9 mg/dL (ref 8.4–10.5)
Chloride: 100 mEq/L (ref 96–112)
Creatinine, Ser: 2.17 mg/dL — ABNORMAL HIGH (ref 0.40–1.20)
GFR: 30.1 mL/min — ABNORMAL LOW (ref >60.00)
GLUCOSE: 128 mg/dL — AB (ref 70–99)
Potassium: 2.9 mEq/L — ABNORMAL LOW (ref 3.5–5.1)
SODIUM: 141 meq/L (ref 135–145)
TOTAL PROTEIN: 6.5 g/dL (ref 6.0–8.3)

## 2015-08-22 LAB — CBC
HCT: 30 % — ABNORMAL LOW (ref 36.0–46.0)
Hemoglobin: 9.7 g/dL — ABNORMAL LOW (ref 12.0–15.0)
MCHC: 32.5 g/dL (ref 30.0–36.0)
MCV: 92.9 fl (ref 78.0–100.0)
Platelets: 252 10*3/uL (ref 150.0–400.0)
RBC: 3.23 Mil/uL — ABNORMAL LOW (ref 3.87–5.11)
RDW: 15.6 % — AB (ref 11.5–15.5)
WBC: 11.1 10*3/uL — AB (ref 4.0–10.5)

## 2015-08-22 MED ORDER — POTASSIUM CHLORIDE ER 20 MEQ PO TBCR: EXTENDED_RELEASE_TABLET | ORAL | Status: AC

## 2015-08-22 MED ORDER — MORPHINE SULFATE ER 15 MG PO TBCR: 15.0000 mg | EXTENDED_RELEASE_TABLET | Freq: Two times a day (BID) | ORAL | Status: DC

## 2015-08-22 MED ORDER — OXYCODONE HCL 20 MG PO TABS: 20.0000 mg | ORAL_TABLET | Freq: Three times a day (TID) | ORAL | Status: DC

## 2015-08-22 NOTE — Progress Notes (Signed)
Pre visit review using our clinic review tool, if applicable. No additional management support is needed unless otherwise documented below in the visit note. 
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
## 2015-08-22 NOTE — Patient Instructions (Signed)
Complete lab work prior to leaving today. I will notify you of your results once received.   Complete xray(s) prior to leaving today. I will notify you of your results once received.  You MUST go to the emergency department if your chest pain continues.  Please follow up with pain management as scheduled next week.  It was a pleasure to see you today!

## 2015-08-23 ENCOUNTER — Other Ambulatory Visit: Payer: Self-pay | Admitting: Primary Care

## 2015-08-23 ENCOUNTER — Telehealth: Payer: Self-pay | Admitting: Primary Care

## 2015-08-23 DIAGNOSIS — E876 Hypokalemia: Secondary | ICD-10-CM

## 2015-08-23 NOTE — Telephone Encounter (Signed)
Patient returned Chan's call. °

## 2015-08-23 NOTE — Telephone Encounter (Signed)
Spoken and notified patient of Kate's comments on results notes. Patient verbalized understanding.

## 2015-08-27 ENCOUNTER — Other Ambulatory Visit (INDEPENDENT_AMBULATORY_CARE_PROVIDER_SITE_OTHER): Payer: BLUE CROSS/BLUE SHIELD

## 2015-08-27 DIAGNOSIS — E876 Hypokalemia: Secondary | ICD-10-CM

## 2015-08-27 LAB — BASIC METABOLIC PANEL
BUN: 48 mg/dL — AB (ref 6–23)
CHLORIDE: 97 meq/L (ref 96–112)
CO2: 36 mEq/L — ABNORMAL HIGH (ref 19–32)
Calcium: 9.4 mg/dL (ref 8.4–10.5)
Creatinine, Ser: 2.17 mg/dL — ABNORMAL HIGH (ref 0.40–1.20)
GFR: 30.1 mL/min — AB (ref >60.00)
GLUCOSE: 136 mg/dL — AB (ref 70–99)
POTASSIUM: 3.7 meq/L (ref 3.5–5.1)
SODIUM: 141 meq/L (ref 135–145)

## 2015-08-29 ENCOUNTER — Telehealth: Payer: Self-pay | Admitting: Primary Care

## 2015-08-29 ENCOUNTER — Encounter: Payer: Self-pay | Admitting: *Deleted

## 2015-08-29 NOTE — Telephone Encounter (Signed)
See lab results. Spoke to pt and informed her of results

## 2015-08-29 NOTE — Telephone Encounter (Signed)
Pt returned call regarding labs  Please call back [phone] Thank you

## 2015-08-30 ENCOUNTER — Encounter: Payer: Self-pay | Admitting: Pain Medicine

## 2015-08-30 ENCOUNTER — Ambulatory Visit: Payer: BLUE CROSS/BLUE SHIELD | Attending: Pain Medicine | Admitting: Pain Medicine

## 2015-08-30 VITALS — BP 177/119 | HR 94 | Temp 98.0°F | Resp 18 | Ht 61.0 in | Wt 126.0 lb

## 2015-08-30 DIAGNOSIS — E871 Hypo-osmolality and hyponatremia: Secondary | ICD-10-CM | POA: Insufficient documentation

## 2015-08-30 DIAGNOSIS — F17210 Nicotine dependence, cigarettes, uncomplicated: Secondary | ICD-10-CM | POA: Insufficient documentation

## 2015-08-30 DIAGNOSIS — E43 Unspecified severe protein-calorie malnutrition: Secondary | ICD-10-CM | POA: Insufficient documentation

## 2015-08-30 DIAGNOSIS — D72810 Lymphocytopenia: Secondary | ICD-10-CM | POA: Diagnosis not present

## 2015-08-30 DIAGNOSIS — R079 Chest pain, unspecified: Secondary | ICD-10-CM | POA: Insufficient documentation

## 2015-08-30 DIAGNOSIS — N184 Chronic kidney disease, stage 4 (severe): Secondary | ICD-10-CM | POA: Insufficient documentation

## 2015-08-30 DIAGNOSIS — F1190 Opioid use, unspecified, uncomplicated: Secondary | ICD-10-CM

## 2015-08-30 DIAGNOSIS — I428 Other cardiomyopathies: Secondary | ICD-10-CM | POA: Diagnosis not present

## 2015-08-30 DIAGNOSIS — M4856XA Collapsed vertebra, not elsewhere classified, lumbar region, initial encounter for fracture: Secondary | ICD-10-CM | POA: Insufficient documentation

## 2015-08-30 DIAGNOSIS — E876 Hypokalemia: Secondary | ICD-10-CM | POA: Insufficient documentation

## 2015-08-30 DIAGNOSIS — E7800 Pure hypercholesterolemia, unspecified: Secondary | ICD-10-CM | POA: Insufficient documentation

## 2015-08-30 DIAGNOSIS — K5903 Drug induced constipation: Secondary | ICD-10-CM | POA: Insufficient documentation

## 2015-08-30 DIAGNOSIS — I1310 Hypertensive heart and chronic kidney disease without heart failure, with stage 1 through stage 4 chronic kidney disease, or unspecified chronic kidney disease: Secondary | ICD-10-CM | POA: Diagnosis not present

## 2015-08-30 DIAGNOSIS — I5042 Chronic combined systolic (congestive) and diastolic (congestive) heart failure: Secondary | ICD-10-CM | POA: Insufficient documentation

## 2015-08-30 DIAGNOSIS — M5417 Radiculopathy, lumbosacral region: Secondary | ICD-10-CM | POA: Insufficient documentation

## 2015-08-30 DIAGNOSIS — K769 Liver disease, unspecified: Secondary | ICD-10-CM | POA: Insufficient documentation

## 2015-08-30 DIAGNOSIS — M810 Age-related osteoporosis without current pathological fracture: Secondary | ICD-10-CM | POA: Insufficient documentation

## 2015-08-30 DIAGNOSIS — M4854XA Collapsed vertebra, not elsewhere classified, thoracic region, initial encounter for fracture: Secondary | ICD-10-CM | POA: Insufficient documentation

## 2015-08-30 DIAGNOSIS — E1122 Type 2 diabetes mellitus with diabetic chronic kidney disease: Secondary | ICD-10-CM | POA: Diagnosis not present

## 2015-08-30 DIAGNOSIS — Z79891 Long term (current) use of opiate analgesic: Secondary | ICD-10-CM | POA: Diagnosis not present

## 2015-08-30 DIAGNOSIS — I7101 Dissection of thoracic aorta: Secondary | ICD-10-CM | POA: Diagnosis not present

## 2015-08-30 DIAGNOSIS — Z5181 Encounter for therapeutic drug level monitoring: Secondary | ICD-10-CM

## 2015-08-30 DIAGNOSIS — M545 Low back pain: Secondary | ICD-10-CM | POA: Insufficient documentation

## 2015-08-30 DIAGNOSIS — Z79899 Other long term (current) drug therapy: Secondary | ICD-10-CM

## 2015-08-30 DIAGNOSIS — G8929 Other chronic pain: Secondary | ICD-10-CM | POA: Insufficient documentation

## 2015-08-30 DIAGNOSIS — M546 Pain in thoracic spine: Secondary | ICD-10-CM

## 2015-08-30 DIAGNOSIS — M549 Dorsalgia, unspecified: Secondary | ICD-10-CM | POA: Diagnosis not present

## 2015-08-30 DIAGNOSIS — T402X5A Adverse effect of other opioids, initial encounter: Secondary | ICD-10-CM

## 2015-08-30 MED ORDER — MORPHINE SULFATE ER 15 MG PO TBCR: 15.0000 mg | EXTENDED_RELEASE_TABLET | Freq: Two times a day (BID) | ORAL | Status: AC

## 2015-08-30 MED ORDER — OXYCODONE HCL 20 MG PO TABS: 20.0000 mg | ORAL_TABLET | Freq: Three times a day (TID) | ORAL | Status: AC | PRN

## 2015-08-30 NOTE — Progress Notes (Signed)
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   morphine (MS CONTIN) 15 MG 12 hr tablet   Oxycodone HCl 20 MG TABS   Other Relevant Orders   DG Lumbar Spine Complete   THORACIC EPIDURAL STEROID INJECTION   Chronic pain (Chronic)   Relevant Medications   predniSONE (DELTASONE) 20 MG tablet   morphine (MS CONTIN) 15 MG 12 hr tablet   Oxycodone HCl 20 MG TABS   Other Relevant Orders   C-reactive protein   Magnesium   Sedimentation rate   Vitamin B12   25-Hydroxyvitamin D Lcms D2+D3   Chronic upper back pain (Chronic)   Relevant  Medications   predniSONE (DELTASONE) 20 MG tablet   morphine (MS CONTIN) 15 MG 12 hr tablet   Oxycodone HCl 20 MG TABS   Other Relevant Orders   DG Thoracic Spine 2 View     Medium   Encounter for therapeutic drug level monitoring   Long term current use of opiate  analgesic (Chronic)   Relevant Orders   ToxASSURE Select 13 (MW), Urine   Long term prescription opiate use (Chronic)   Opioid-induced constipation (OIC) (Chronic)    Other Visit Diagnoses    Chronic back pain    -  Primary    Relevant Medications    predniSONE (DELTASONE) 20 MG tablet    morphine (MS CONTIN) 15 MG 12 hr tablet    Oxycodone HCl 20 MG TABS        Pharmacotherapy (Medications Ordered): Meds ordered this encounter  Medications  . morphine (MS CONTIN) 15 MG 12 hr tablet    Sig: Take 1 tablet (15 mg total) by mouth every 12 (twelve) hours.    Dispense:  60 tablet    Refill:  0    Do not add this medication to the electronic "Automatic Refill" notification system. Patient may have prescription filled one day early if pharmacy is closed on scheduled refill date. Do not fill until: 08/30/15 To last until: 09/29/15  . Oxycodone HCl 20 MG TABS    Sig: Take 1 tablet (20 mg total) by mouth every 8 (eight) hours as needed.    Dispense:  90 tablet    Refill:  0    Do not add this medication to the electronic "Automatic Refill" notification system. Patient may have prescription filled one day early if pharmacy is closed on scheduled refill date. Do not fill until: 08/30/15 To last until: 09/29/15    [facility] Procedure Ordered: Orders Placed This Encounter  Procedures  . THORACIC EPIDURAL STEROID INJECTION  . DG Thoracic Spine 2 View  . DG Lumbar Spine Complete  . ToxASSURE Select 13 (MW), Urine  . C-reactive protein  . Magnesium  . Sedimentation rate  . Vitamin B12  . 25-Hydroxyvitamin D Lcms D2+D3    Imaging Ordered: DG THORACIC SPINE 2 VIEW DG LUMBAR SPINE COMPLETE 4 +V  Interventional Therapies: Scheduled:  Diagnostic thoracic epidural steroid injection under fluoroscopic guidance, no sedation.    Considering:  None at this time.    PRN Procedures:  None at this time.    Referral(s) or Consult(s): None at this time.  New Prescriptions   No  medications on file    Medications administered during this visit: Ms. Hogston had no medications administered during this visit.  Requested PM Follow-up: Return in about 4 weeks (around 09/24/2015) for Procedure (Scheduled).  Future Appointments Date Time Provider Department Center  09/11/2015 1:50 PM [facility] MR 3 [facility]  09/11/2015 3:30 PM Loreli Slot, MD [facility]  09/12/2015 11:00 AM Antonieta Iba, MD [facility]    Primary Care Physician: Morrie Sheldon, NP Location: [facility] Outpatient [facility] Management Facility Note by: Sydnee Levans Laban Emperor, M.D, DABA, DABAPM, DABPM, DABIPP, FIPP  Pain Score Disclaimer: We use the NRS-11 scale. This is a self-reported, subjective measurement of pain severity with only modest accuracy. It is used primarily to identify changes within a particular patient. It must be understood that outpatient pain scales are significantly less accurate that those used for research, where they can be applied under ideal controlled circumstances with minimal exposure to variables. In  reality, the score is likely to be a combination of pain intensity and pain affect, where pain affect describes the degree of emotional arousal or changes in action readiness caused by the sensory experience of pain. Factors such as social and work situation, setting, emotional state, anxiety levels, expectation, and prior pain experience may influence pain perception and show large inter-individual differences that may also be affected by time variables.  Patient instructions provided during this appointment: Patient Instructions   Pain Management Discharge Instructions  General Discharge Instructions :  If you need to reach your doctor call: Monday-Friday 8:00 am - 4:00 pm at [phone] or toll free [phone].  After clinic hours [phone] to have operator reach doctor.  Bring all of your medication bottles to all your appointments  in the pain clinic.  To cancel or reschedule your appointment with Pain Management please remember to call 24 hours in advance to avoid a fee.  Refer to the educational materials which you have been given on: General Risks, I had my Procedure. Discharge Instructions, Post Sedation.  Post Procedure Instructions:  The drugs you were given will stay in your system until tomorrow, so for the next 24 hours you should not drive, make any legal decisions or drink any alcoholic beverages.  You may eat anything you prefer, but it is better to start with liquids then soups and crackers, and gradually work up to solid foods.  Please notify your doctor immediately if you have any unusual bleeding, trouble breathing or pain that is not related to your normal pain.  Depending on the type of procedure that was done, some parts of your body may feel week and/or numb.  This usually clears up by tonight or the next day.  Walk with the use of an assistive device or accompanied by an adult for the 24 hours.  You may use ice on the affected area for the first 24 hours.  Put ice in a Ziploc bag and cover with a towel and place against area 15 minutes on 15 minutes off.  You may switch to heat after 24 hours.GENERAL RISKS AND COMPLICATIONS  What are the risk, side effects and possible complications? Generally speaking, most procedures are safe.  However, with any procedure there are risks, side effects, and the possibility of complications.  The risks and complications are dependent upon the sites that are lesioned, or the type of nerve block to be performed.  The closer the procedure is to the spine, the more serious the risks are.  Great care is taken when placing the radio frequency needles, block needles or lesioning probes, but sometimes complications can occur. 1. Infection: Any time there is an injection through the skin, there is a risk of infection.  This is why sterile conditions are used for these blocks.   There are four possible types of infection. 1. Localized skin infection. 2. Central Nervous System Infection-This can be in the form of Meningitis, which can be deadly. 3. Epidural Infections-This can be in the form of an epidural abscess, which can cause pressure inside of the spine, causing compression of the spinal cord with subsequent paralysis. This would require an emergency surgery to decompress, and there are no guarantees that the patient would recover from the paralysis. 4. Discitis-This is an infection of the intervertebral discs.  It occurs in about 1% of discography procedures.  It is difficult to treat and it may lead to surgery.        2. Pain: the  needles have to go through skin and soft tissues, will cause soreness.       3. Damage to internal structures:  The nerves to be lesioned may be near blood vessels or    other nerves which can be potentially damaged.       4. Bleeding: Bleeding is more common if the patient is taking blood thinners such as  aspirin, Coumadin, Ticiid, Plavix, etc., or if he/she have some genetic predisposition  such as hemophilia. Bleeding into the spinal canal can cause compression of the spinal  cord with subsequent paralysis.  This would require an emergency surgery to  decompress and there are no guarantees that the patient would recover from the  paralysis.       5. Pneumothorax:  Puncturing of a lung is a possibility, every time a needle is introduced in  the area of the chest or upper back.  Pneumothorax refers to free air around the  collapsed lung(s), inside of the thoracic cavity (chest cavity).  Another two possible  complications related to a similar event would include: Hemothorax and Chylothorax.   These are variations of the Pneumothorax, where instead of air around the collapsed  lung(s), you may have blood or chyle, respectively.       6. Spinal headaches: They may occur with any procedures in the area of the spine.       7. Persistent CSF  (Cerebro-Spinal Fluid) leakage: This is a rare problem, but may occur  with prolonged intrathecal or epidural catheters either due to the formation of a fistulous  track or a dural tear.       8. Nerve damage: By working so close to the spinal cord, there is always a possibility of  nerve damage, which could be as serious as a permanent spinal cord injury with  paralysis.       9. Death:  Although rare, severe deadly allergic reactions known as "Anaphylactic  reaction" can occur to any of the medications used.      10. Worsening of the symptoms:  We can always make thing worse.  What are the chances of something like this happening? Chances of any of this occuring are extremely low.  By statistics, you have more of a chance of getting killed in a motor vehicle accident: while driving to the hospital than any of the above occurring .  Nevertheless, you should be aware that they are possibilities.  In general, it is similar to taking a shower.  Everybody knows that you can slip, hit your head and get killed.  Does that mean that you should not shower again?  Nevertheless always keep in mind that statistics do not mean anything if you happen to be on the wrong side of them.  Even if a procedure has a 1 (one) in a 1,000,000 (million) chance of going wrong, it you happen to be that one..Also, keep in mind that by statistics, you have more of a chance of having something go wrong when taking medications.  Who should not have this procedure? If you are on a blood thinning medication (e.g. Coumadin, Plavix, see list of "Blood Thinners"), or if you have an active infection going on, you should not have the procedure.  If you are taking any blood thinners, please inform your physician.  How should I prepare for this procedure?  Do not eat or drink anything at least six hours prior to the procedure.  Bring a driver with you .  It cannot be a taxi.  Come accompanied by an adult that can drive you back, and that  is strong enough to help you if your legs get weak or numb from the local anesthetic.  Take all of your medicines the morning of the procedure with just enough water to swallow them.  If you have diabetes, make sure that you are scheduled to have your procedure done first thing in the morning, whenever possible.  If you have diabetes, take only half of your insulin dose and notify our nurse that you have done so as soon as you arrive at the clinic.  If you are diabetic, but only take blood sugar pills (oral hypoglycemic), then do not take them on the morning of your procedure.  You may take them after you have had the procedure.  Do not take aspirin or any aspirin-containing medications, at least eleven (11) days prior to the procedure.  They may prolong bleeding.  Wear loose fitting clothing that may be easy to take off and that you would not mind if it got stained with Betadine or blood.  Do not wear any jewelry or perfume  Remove any nail coloring.  It will interfere with some of our monitoring equipment.  NOTE: Remember that this is not meant to be interpreted as a complete list of all possible complications.  Unforeseen problems may occur.  BLOOD THINNERS The following drugs contain aspirin or other products, which can cause increased bleeding during surgery and should not be taken for 2 weeks prior to and 1 week after surgery.  If you should need take something for relief of minor pain, you may take acetaminophen which is found in Tylenol,m Datril, Anacin-3 and Panadol. It is not blood thinner. The products listed below are.  Do not take any of the products listed below in addition to any listed on your instruction sheet.  A.P.C or A.P.C with Codeine Codeine Phosphate Capsules #3 Ibuprofen Ridaura  ABC compound Congesprin Imuran rimadil  Advil Cope Indocin Robaxisal  Alka-Seltzer Effervescent Pain Reliever and Antacid Coricidin or Coricidin-D  Indomethacin Rufen  Alka-Seltzer plus  Cold Medicine Cosprin Ketoprofen S-A-C Tablets  Anacin Analgesic Tablets or Capsules Coumadin Korlgesic Salflex  Anacin Extra Strength Analgesic tablets or capsules CP-2 Tablets Lanoril Salicylate  Anaprox Cuprimine Capsules Levenox Salocol  Anexsia-D Dalteparin Magan Salsalate  Anodynos Darvon compound Magnesium Salicylate Sine-off  Ansaid Dasin Capsules Magsal Sodium Salicylate  Anturane Depen Capsules Marnal Soma  APF Arthritis pain formula Dewitt's Pills Measurin Stanback  Argesic Dia-Gesic Meclofenamic Sulfinpyrazone  Arthritis Bayer Timed Release Aspirin Diclofenac Meclomen Sulindac  Arthritis pain formula Anacin Dicumarol Medipren Supac  Analgesic (Safety coated) Arthralgen Diffunasal Mefanamic Suprofen  Arthritis Strength Bufferin Dihydrocodeine Mepro Compound Suprol  Arthropan liquid Dopirydamole Methcarbomol with Aspirin Synalgos  ASA tablets/Enseals Disalcid Micrainin Tagament  Ascriptin Doan's Midol Talwin  Ascriptin A/D Dolene Mobidin Tanderil  Ascriptin Extra Strength Dolobid Moblgesic Ticlid  Ascriptin with Codeine Doloprin or Doloprin with Codeine Momentum Tolectin  Asperbuf Duoprin Mono-gesic Trendar  Aspergum Duradyne Motrin or Motrin IB Triminicin  Aspirin plain, buffered or enteric coated Durasal Myochrisine Trigesic  Aspirin Suppositories Easprin Nalfon Trillsate  Aspirin with Codeine Ecotrin Regular or Extra Strength Naprosyn Uracel  Atromid-S Efficin Naproxen Ursinus  Auranofin Capsules Elmiron Neocylate Vanquish  Axotal Emagrin Norgesic Verin  Azathioprine Empirin or Empirin with Codeine Normiflo Vitamin E  Azolid Emprazil Nuprin Voltaren  Bayer Aspirin plain, buffered or children's or timed BC Tablets or powders Encaprin Orgaran Warfarin Sodium  Buff-a-Comp Enoxaparin Orudis Zorpin  Buff-a-Comp with Codeine Equegesic Os-Cal-Gesic   Buffaprin Excedrin plain, buffered or Extra Strength Oxalid   Bufferin Arthritis Strength Feldene Oxphenbutazone   Bufferin  plain or Extra Strength Feldene Capsules Oxycodone with Aspirin   Bufferin with Codeine Fenoprofen Fenoprofen Pabalate or Pabalate-SF   Buffets II Flogesic Panagesic   Buffinol plain or Extra Strength Florinal or Florinal with Codeine Panwarfarin   Buf-Tabs Flurbiprofen Penicillamine   Butalbital Compound Four-way cold tablets Penicillin   Butazolidin Fragmin Pepto-Bismol   Carbenicillin Geminisyn Percodan   Carna Arthritis Reliever Geopen Persantine   Carprofen Gold's salt Persistin   Chloramphenicol Goody's Phenylbutazone   Chloromycetin Haltrain Piroxlcam   Clmetidine heparin Plaquenil   Cllnoril Hyco-pap Ponstel   Clofibrate Hydroxy chloroquine Propoxyphen         Before stopping any of these medications, be sure to consult the physician who ordered them.  Some, such as Coumadin (Warfarin) are ordered to prevent or treat serious conditions such as "deep thrombosis", "pumonary embolisms", and other heart problems.  The amount of time that you may need off of the medication may also vary with the medication and the reason for which you were taking it.  If you are taking any of these medications, please make sure you notify your pain physician before you undergo any procedures.         Epidural Steroid Injection An epidural steroid injection is given to relieve pain in your neck, back, or legs that is caused by the irritation or swelling of a nerve root. This procedure involves injecting a steroid and numbing medicine (anesthetic) into the epidural space. The epidural space is the space between the outer covering of your spinal cord and the bones that form your backbone (vertebra).  LET [facility] CARE PROVIDER KNOW ABOUT:  2. Any allergies you have. 3. All medicines you are taking, including vitamins, herbs, eye drops, creams, and over-the-counter medicines such as aspirin. 4. Previous problems you or members of your family have had with the use of anesthetics. 5. Any blood  disorders or blood clotting disorders you have. 6. Previous surgeries you have had. 7. Medical conditions you have. RISKS AND COMPLICATIONS Generally, this is a safe procedure. However, as with any procedure, complications can occur. Possible complications of epidural steroid injection include:  Headache.  Bleeding.  Infection.  Allergic reaction to the medicines.  Damage to your nerves. The response to this procedure depends on the underlying cause of the pain and its duration. People who have long-term (chronic) pain are less likely to benefit from epidural steroids than are those people whose pain comes on strong and suddenly. BEFORE THE PROCEDURE   Ask your health care provider about changing or stopping your regular medicines. You may be advised to stop taking blood-thinning medicines a few days before the procedure.  You may be given medicines to reduce anxiety.  Arrange for someone to take you home after the procedure. PROCEDURE   You will remain awake during the procedure. You may receive medicine to make you relaxed.  You will be asked to lie on your stomach.  The injection site will be cleaned.  The injection site will be numbed with a medicine (local anesthetic).  A needle will be injected through your skin into the epidural space.  Your health care provider will use an X-ray machine to ensure that the steroid is delivered closest to the affected nerve. You may have minimal discomfort at this time.  Once the needle  is in the right position, the local anesthetic and the steroid will be injected into the epidural space.  The needle will then be removed and a bandage will be applied to the injection site. AFTER THE PROCEDURE  12. You may be monitored for a short time before you go home. 13. You may feel weakness or numbness in your arm or leg, which disappears within hours. 14. You may be allowed to eat, drink, and take your regular medicine. 15. You may have  soreness at the site of the injection.   This information is not intended to replace advice given to you by your health care provider. Make sure you discuss any questions you have with your health care provider.   Document Released: 06/17/2007 Document Revised: 11/10/2012 Document Reviewed: 08/27/2012 Elsevier Interactive Patient Education [employer]

## 2015-08-30 NOTE — Patient Instructions (Addendum)
Pain Management Discharge Instructions  General Discharge Instructions :  If you need to reach your doctor call: Monday-Friday 8:00 am - 4:00 pm at [phone] or toll free [phone].  After clinic hours [phone] to have operator reach doctor.  Bring all of your medication bottles to all your appointments in the pain clinic.  To cancel or reschedule your appointment with Pain Management please remember to call 24 hours in advance to avoid a fee.  Refer to the educational materials which you have been given on: General Risks, I had my Procedure. Discharge Instructions, Post Sedation.  Post Procedure Instructions:  The drugs you were given will stay in your system until tomorrow, so for the next 24 hours you should not drive, make any legal decisions or drink any alcoholic beverages.  You may eat anything you prefer, but it is better to start with liquids then soups and crackers, and gradually work up to solid foods.  Please notify your doctor immediately if you have any unusual bleeding, trouble breathing or pain that is not related to your normal pain.  Depending on the type of procedure that was done, some parts of your body may feel week and/or numb.  This usually clears up by tonight or the next day.  Walk with the use of an assistive device or accompanied by an adult for the 24 hours.  You may use ice on the affected area for the first 24 hours.  Put ice in a Ziploc bag and cover with a towel and place against area 15 minutes on 15 minutes off.  You may switch to heat after 24 hours.GENERAL RISKS AND COMPLICATIONS  What are the risk, side effects and possible complications? Generally speaking, most procedures are safe.  However, with any procedure there are risks, side effects, and the possibility of complications.  The risks and complications are dependent upon the sites that are lesioned, or the type of nerve block to be performed.  The closer the procedure is to the spine,  the more serious the risks are.  Great care is taken when placing the radio frequency needles, block needles or lesioning probes, but sometimes complications can occur. 1. Infection: Any time there is an injection through the skin, there is a risk of infection.  This is why sterile conditions are used for these blocks.  There are four possible types of infection. 1. Localized skin infection. 2. Central Nervous System Infection-This can be in the form of Meningitis, which can be deadly. 3. Epidural Infections-This can be in the form of an epidural abscess, which can cause pressure inside of the spine, causing compression of the spinal cord with subsequent paralysis. This would require an emergency surgery to decompress, and there are no guarantees that the patient would recover from the paralysis. 4. Discitis-This is an infection of the intervertebral discs.  It occurs in about 1% of discography procedures.  It is difficult to treat and it may lead to surgery.        2. Pain: the needles have to go through skin and soft tissues, will cause soreness.       3. Damage to internal structures:  The nerves to be lesioned may be near blood vessels or    other nerves which can be potentially damaged.       4. Bleeding: Bleeding is more common if the patient is taking blood thinners such as  aspirin, Coumadin, Ticiid, Plavix, etc., or if he/she have some genetic predisposition  such as   hemophilia. Bleeding into the spinal canal can cause compression of the spinal  cord with subsequent paralysis.  This would require an emergency surgery to  decompress and there are no guarantees that the patient would recover from the  paralysis.       5. Pneumothorax:  Puncturing of a lung is a possibility, every time a needle is introduced in  the area of the chest or upper back.  Pneumothorax refers to free air around the  collapsed lung(s), inside of the thoracic cavity (chest cavity).  Another two possible  complications  related to a similar event would include: Hemothorax and Chylothorax.   These are variations of the Pneumothorax, where instead of air around the collapsed  lung(s), you may have blood or chyle, respectively.       6. Spinal headaches: They may occur with any procedures in the area of the spine.       7. Persistent CSF (Cerebro-Spinal Fluid) leakage: This is a rare problem, but may occur  with prolonged intrathecal or epidural catheters either due to the formation of a fistulous  track or a dural tear.       8. Nerve damage: By working so close to the spinal cord, there is always a possibility of  nerve damage, which could be as serious as a permanent spinal cord injury with  paralysis.       9. Death:  Although rare, severe deadly allergic reactions known as "Anaphylactic  reaction" can occur to any of the medications used.      10. Worsening of the symptoms:  We can always make thing worse.  What are the chances of something like this happening? Chances of any of this occuring are extremely low.  By statistics, you have more of a chance of getting killed in a motor vehicle accident: while driving to the hospital than any of the above occurring .  Nevertheless, you should be aware that they are possibilities.  In general, it is similar to taking a shower.  Everybody knows that you can slip, hit your head and get killed.  Does that mean that you should not shower again?  Nevertheless always keep in mind that statistics do not mean anything if you happen to be on the wrong side of them.  Even if a procedure has a 1 (one) in a 1,000,000 (million) chance of going wrong, it you happen to be that one..Also, keep in mind that by statistics, you have more of a chance of having something go wrong when taking medications.  Who should not have this procedure? If you are on a blood thinning medication (e.g. Coumadin, Plavix, see list of "Blood Thinners"), or if you have an active infection going on, you should not  have the procedure.  If you are taking any blood thinners, please inform your physician.  How should I prepare for this procedure?  Do not eat or drink anything at least six hours prior to the procedure.  Bring a driver with you .  It cannot be a taxi.  Come accompanied by an adult that can drive you back, and that is strong enough to help you if your legs get weak or numb from the local anesthetic.  Take all of your medicines the morning of the procedure with just enough water to swallow them.  If you have diabetes, make sure that you are scheduled to have your procedure done first thing in the morning, whenever possible.  If you have diabetes,   take only half of your insulin dose and notify our nurse that you have done so as soon as you arrive at the clinic.  If you are diabetic, but only take blood sugar pills (oral hypoglycemic), then do not take them on the morning of your procedure.  You may take them after you have had the procedure.  Do not take aspirin or any aspirin-containing medications, at least eleven (11) days prior to the procedure.  They may prolong bleeding.  Wear loose fitting clothing that may be easy to take off and that you would not mind if it got stained with Betadine or blood.  Do not wear any jewelry or perfume  Remove any nail coloring.  It will interfere with some of our monitoring equipment.  NOTE: Remember that this is not meant to be interpreted as a complete list of all possible complications.  Unforeseen problems may occur.  BLOOD THINNERS The following drugs contain aspirin or other products, which can cause increased bleeding during surgery and should not be taken for 2 weeks prior to and 1 week after surgery.  If you should need take something for relief of minor pain, you may take acetaminophen which is found in Tylenol,m Datril, Anacin-3 and Panadol. It is not blood thinner. The products listed below are.  Do not take any of the products listed below  in addition to any listed on your instruction sheet.  A.P.C or A.P.C with Codeine Codeine Phosphate Capsules #3 Ibuprofen Ridaura  ABC compound Congesprin Imuran rimadil  Advil Cope Indocin Robaxisal  Alka-Seltzer Effervescent Pain Reliever and Antacid Coricidin or Coricidin-D  Indomethacin Rufen  Alka-Seltzer plus Cold Medicine Cosprin Ketoprofen S-A-C Tablets  Anacin Analgesic Tablets or Capsules Coumadin Korlgesic Salflex  Anacin Extra Strength Analgesic tablets or capsules CP-2 Tablets Lanoril Salicylate  Anaprox Cuprimine Capsules Levenox Salocol  Anexsia-D Dalteparin Magan Salsalate  Anodynos Darvon compound Magnesium Salicylate Sine-off  Ansaid Dasin Capsules Magsal Sodium Salicylate  Anturane Depen Capsules Marnal Soma  APF Arthritis pain formula Dewitt's Pills Measurin Stanback  Argesic Dia-Gesic Meclofenamic Sulfinpyrazone  Arthritis Bayer Timed Release Aspirin Diclofenac Meclomen Sulindac  Arthritis pain formula Anacin Dicumarol Medipren Supac  Analgesic (Safety coated) Arthralgen Diffunasal Mefanamic Suprofen  Arthritis Strength Bufferin Dihydrocodeine Mepro Compound Suprol  Arthropan liquid Dopirydamole Methcarbomol with Aspirin Synalgos  ASA tablets/Enseals Disalcid Micrainin Tagament  Ascriptin Doan's Midol Talwin  Ascriptin A/D Dolene Mobidin Tanderil  Ascriptin Extra Strength Dolobid Moblgesic Ticlid  Ascriptin with Codeine Doloprin or Doloprin with Codeine Momentum Tolectin  Asperbuf Duoprin Mono-gesic Trendar  Aspergum Duradyne Motrin or Motrin IB Triminicin  Aspirin plain, buffered or enteric coated Durasal Myochrisine Trigesic  Aspirin Suppositories Easprin Nalfon Trillsate  Aspirin with Codeine Ecotrin Regular or Extra Strength Naprosyn Uracel  Atromid-S Efficin Naproxen Ursinus  Auranofin Capsules Elmiron Neocylate Vanquish  Axotal Emagrin Norgesic Verin  Azathioprine Empirin or Empirin with Codeine Normiflo Vitamin E  Azolid Emprazil Nuprin Voltaren  Bayer  Aspirin plain, buffered or children's or timed BC Tablets or powders Encaprin Orgaran Warfarin Sodium  Buff-a-Comp Enoxaparin Orudis Zorpin  Buff-a-Comp with Codeine Equegesic Os-Cal-Gesic   Buffaprin Excedrin plain, buffered or Extra Strength Oxalid   Bufferin Arthritis Strength Feldene Oxphenbutazone   Bufferin plain or Extra Strength Feldene Capsules Oxycodone with Aspirin   Bufferin with Codeine Fenoprofen Fenoprofen Pabalate or Pabalate-SF   Buffets II Flogesic Panagesic   Buffinol plain or Extra Strength Florinal or Florinal with Codeine Panwarfarin   Buf-Tabs Flurbiprofen Penicillamine   Butalbital Compound Four-way cold tablets   Penicillin   Butazolidin Fragmin Pepto-Bismol   Carbenicillin Geminisyn Percodan   Carna Arthritis Reliever Geopen Persantine   Carprofen Gold's salt Persistin   Chloramphenicol Goody's Phenylbutazone   Chloromycetin Haltrain Piroxlcam   Clmetidine heparin Plaquenil   Cllnoril Hyco-pap Ponstel   Clofibrate Hydroxy chloroquine Propoxyphen         Before stopping any of these medications, be sure to consult the physician who ordered them.  Some, such as Coumadin (Warfarin) are ordered to prevent or treat serious conditions such as "deep thrombosis", "pumonary embolisms", and other heart problems.  The amount of time that you may need off of the medication may also vary with the medication and the reason for which you were taking it.  If you are taking any of these medications, please make sure you notify your pain physician before you undergo any procedures.         Epidural Steroid Injection An epidural steroid injection is given to relieve pain in your neck, back, or legs that is caused by the irritation or swelling of a nerve root. This procedure involves injecting a steroid and numbing medicine (anesthetic) into the epidural space. The epidural space is the space between the outer covering of your spinal cord and the bones that form your backbone  (vertebra).  LET YOUR HEALTH CARE PROVIDER KNOW ABOUT:  2. Any allergies you have. 3. All medicines you are taking, including vitamins, herbs, eye drops, creams, and over-the-counter medicines such as aspirin. 4. Previous problems you or members of your family have had with the use of anesthetics. 5. Any blood disorders or blood clotting disorders you have. 6. Previous surgeries you have had. 7. Medical conditions you have. RISKS AND COMPLICATIONS Generally, this is a safe procedure. However, as with any procedure, complications can occur. Possible complications of epidural steroid injection include:  Headache.  Bleeding.  Infection.  Allergic reaction to the medicines.  Damage to your nerves. The response to this procedure depends on the underlying cause of the pain and its duration. People who have long-term (chronic) pain are less likely to benefit from epidural steroids than are those people whose pain comes on strong and suddenly. BEFORE THE PROCEDURE   Ask your health care provider about changing or stopping your regular medicines. You may be advised to stop taking blood-thinning medicines a few days before the procedure.  You may be given medicines to reduce anxiety.  Arrange for someone to take you home after the procedure. PROCEDURE   You will remain awake during the procedure. You may receive medicine to make you relaxed.  You will be asked to lie on your stomach.  The injection site will be cleaned.  The injection site will be numbed with a medicine (local anesthetic).  A needle will be injected through your skin into the epidural space.  Your health care provider will use an X-ray machine to ensure that the steroid is delivered closest to the affected nerve. You may have minimal discomfort at this time.  Once the needle is in the right position, the local anesthetic and the steroid will be injected into the epidural space.  The needle will then be removed and a  bandage will be applied to the injection site. AFTER THE PROCEDURE  12. You may be monitored for a short time before you go home. 13. You may feel weakness or numbness in your arm or leg, which disappears within hours. 14. You may be allowed to eat, drink, and take your regular   medicine. 15. You may have soreness at the site of the injection.   This information is not intended to replace advice given to you by your health care provider. Make sure you discuss any questions you have with your health care provider.   Document Released: 06/17/2007 Document Revised: 11/10/2012 Document Reviewed: 08/27/2012 Elsevier Interactive Patient Education 2016 [employer]  

## 2015-08-30 NOTE — Progress Notes (Signed)
Safety precautions to be maintained throughout the outpatient stay will include: orient to surroundings, keep bed in low position, maintain call bell within reach at all times, provide assistance with transfer out of bed and ambulation.  MD currently watching BP and swelling of lower etrem.- not swelling today Pt here today to obtain pain meds states current doctor will not give her anymore and only gave enough until todays visit

## 2015-09-07 LAB — TOXASSURE SELECT 13 (MW), URINE: PDF: 0

## 2015-09-10 NOTE — Progress Notes (Signed)
Quick Note:  NOTE: This forensic urine drug screen (UDS) test was conducted using a state-of-the-art ultra high performance liquid chromatography and mass spectrometry system (UPLC/MS-MS), the most sophisticated and accurate method available. UPLC/MS-MS is 1,000 times more precise and accurate than standard gas chromatography and mass spectrometry (GC/MS). This system can analyze 26 drug categories and 180 drug compounds.  Unreported substance: Hydrocodone  The findings of this UDT were reported as abnormal due to inconsistencies with expected results. An unreported substance was identified in the sample. Expectations were based on the medication history provided by the patient at the time of sample collection.  These results may suggest one of the following possibilities:  1). The use of multiple providers, suggesting the illegal practice of "Doctor Shopping", in violation of [place] Statutes, as well as our medication agreement.  2). The use of unsanctioned and possibly illegal substances, in violation of [place] Statutes, as well as our medication agreement. 3). Inaccurate list of reported substances. ______

## 2015-09-11 ENCOUNTER — Ambulatory Visit: Payer: BLUE CROSS/BLUE SHIELD | Admitting: Thoracic Surgery (Cardiothoracic Vascular Surgery)

## 2015-09-11 ENCOUNTER — Other Ambulatory Visit: Payer: BLUE CROSS/BLUE SHIELD

## 2015-09-11 ENCOUNTER — Telehealth: Payer: Self-pay | Admitting: Primary Care

## 2015-09-11 ENCOUNTER — Ambulatory Visit: Payer: BLUE CROSS/BLUE SHIELD | Admitting: Pain Medicine

## 2015-09-11 NOTE — Telephone Encounter (Signed)
I received a death certificate for Lisa Leblanc to complete but am unaware of the cause of death. Please call [facility] at [phone] for any information. Also, I believe she did have a medical examiner case. Any info from that? I will not be able to sign unless I have this information.

## 2015-09-12 ENCOUNTER — Ambulatory Visit: Payer: Self-pay | Admitting: Cardiovascular Disease

## 2015-09-22 DEATH — deceased

## 2016-01-05 IMAGING — CR DG CHEST 1V PORT
1 series · 1 of 1 positions shown · non-contrast
Comparison: 07/04/2013

CLINICAL DATA: Midsternal back and upper chest pain.

EXAM:
PORTABLE CHEST - 1 VIEW

[ap]
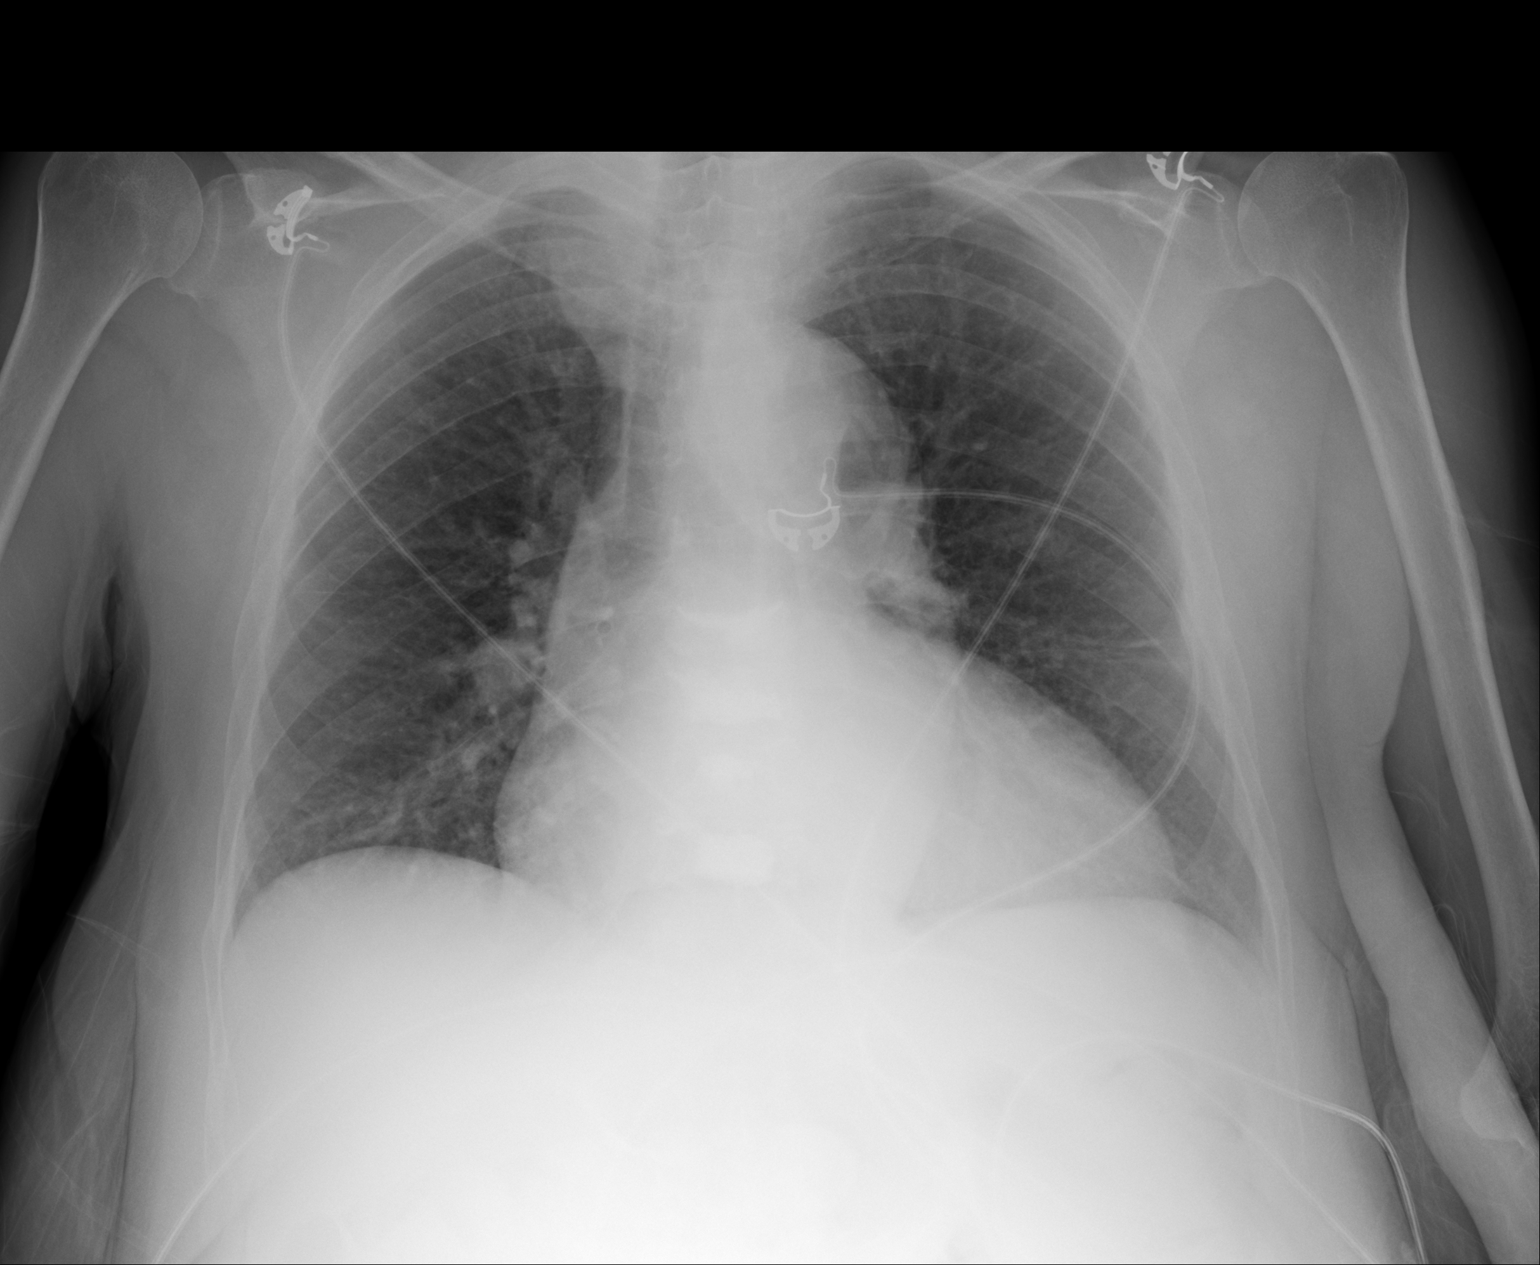

[1 of 1 positions shown; findings below may reference images not displayed]

FINDINGS: Cardiomegaly which stable from previous. Unchanged aortic tortuosity
and widening of the upper mediastinum, likely great vessel ectasia.
Minimal scar atelectasis in the left mid lung. There is no edema,
consolidation, effusion, or pneumothorax.
IMPRESSION: Cardiomegaly without failure.
